# Patient Record
Sex: Female | Born: 1945 | Race: Black or African American | Hispanic: No | Smoking: Never smoker
Health system: Southern US, Community
[De-identification: ages and names within clinical notes are randomized; demographics above are authoritative.]

## PROBLEM LIST (undated history)

## (undated) DIAGNOSIS — E119 Type 2 diabetes mellitus without complications: Secondary | ICD-10-CM

## (undated) DIAGNOSIS — Z87442 Personal history of urinary calculi: Secondary | ICD-10-CM

## (undated) DIAGNOSIS — R609 Edema, unspecified: Secondary | ICD-10-CM

## (undated) DIAGNOSIS — R519 Headache, unspecified: Secondary | ICD-10-CM

## (undated) DIAGNOSIS — E875 Hyperkalemia: Secondary | ICD-10-CM

## (undated) DIAGNOSIS — K589 Irritable bowel syndrome without diarrhea: Secondary | ICD-10-CM

## (undated) DIAGNOSIS — Z5189 Encounter for other specified aftercare: Secondary | ICD-10-CM

## (undated) DIAGNOSIS — M419 Scoliosis, unspecified: Secondary | ICD-10-CM

## (undated) DIAGNOSIS — R51 Headache: Secondary | ICD-10-CM

## (undated) DIAGNOSIS — IMO0002 Reserved for concepts with insufficient information to code with codable children: Secondary | ICD-10-CM

## (undated) DIAGNOSIS — M1990 Unspecified osteoarthritis, unspecified site: Secondary | ICD-10-CM

## (undated) DIAGNOSIS — K219 Gastro-esophageal reflux disease without esophagitis: Secondary | ICD-10-CM

## (undated) DIAGNOSIS — Z9889 Other specified postprocedural states: Secondary | ICD-10-CM

## (undated) DIAGNOSIS — I10 Essential (primary) hypertension: Secondary | ICD-10-CM

## (undated) HISTORY — PX: ROTATOR CUFF REPAIR: SHX139

## (undated) HISTORY — DX: Scoliosis, unspecified: M41.9

## (undated) HISTORY — DX: Gastro-esophageal reflux disease without esophagitis: K21.9

## (undated) HISTORY — DX: Irritable bowel syndrome, unspecified: K58.9

## (undated) HISTORY — DX: Hyperkalemia: E87.5

## (undated) HISTORY — DX: Essential (primary) hypertension: I10

## (undated) HISTORY — DX: Encounter for other specified aftercare: Z51.89

## (undated) HISTORY — DX: Reserved for concepts with insufficient information to code with codable children: IMO0002

## (undated) HISTORY — DX: Headache: R51

## (undated) HISTORY — DX: Headache, unspecified: R51.9

## (undated) HISTORY — DX: Unspecified osteoarthritis, unspecified site: M19.90

## (undated) HISTORY — DX: Edema, unspecified: R60.9

## (undated) HISTORY — DX: Type 2 diabetes mellitus without complications: E11.9

---

## 1968-04-18 DIAGNOSIS — Z5189 Encounter for other specified aftercare: Secondary | ICD-10-CM

## 1968-04-18 HISTORY — PX: ABDOMINAL HYSTERECTOMY: SHX81

## 1968-04-18 HISTORY — DX: Encounter for other specified aftercare: Z51.89

## 2000-06-13 ENCOUNTER — Other Ambulatory Visit: Admission: RE | Admit: 2000-06-13 | Discharge: 2000-06-13 | Payer: Self-pay | Admitting: Internal Medicine

## 2000-06-13 ENCOUNTER — Encounter (INDEPENDENT_AMBULATORY_CARE_PROVIDER_SITE_OTHER): Payer: Self-pay

## 2000-07-06 ENCOUNTER — Encounter: Admission: RE | Admit: 2000-07-06 | Discharge: 2000-07-19 | Payer: Self-pay | Admitting: Internal Medicine

## 2001-04-18 HISTORY — PX: CARPAL TUNNEL RELEASE: SHX101

## 2001-05-25 ENCOUNTER — Ambulatory Visit (HOSPITAL_COMMUNITY): Admission: RE | Admit: 2001-05-25 | Discharge: 2001-05-25 | Payer: Self-pay | Admitting: Orthopaedic Surgery

## 2001-05-25 ENCOUNTER — Encounter: Payer: Self-pay | Admitting: Orthopaedic Surgery

## 2001-06-26 ENCOUNTER — Ambulatory Visit (HOSPITAL_BASED_OUTPATIENT_CLINIC_OR_DEPARTMENT_OTHER): Admission: RE | Admit: 2001-06-26 | Discharge: 2001-06-27 | Payer: Self-pay | Admitting: Orthopaedic Surgery

## 2001-09-21 ENCOUNTER — Other Ambulatory Visit: Admission: RE | Admit: 2001-09-21 | Discharge: 2001-09-21 | Payer: Self-pay | Admitting: Internal Medicine

## 2002-02-07 ENCOUNTER — Other Ambulatory Visit: Admission: RE | Admit: 2002-02-07 | Discharge: 2002-02-07 | Payer: Self-pay | Admitting: Internal Medicine

## 2002-03-12 ENCOUNTER — Encounter: Payer: Self-pay | Admitting: Internal Medicine

## 2002-03-12 ENCOUNTER — Encounter: Admission: RE | Admit: 2002-03-12 | Discharge: 2002-03-12 | Payer: Self-pay | Admitting: Internal Medicine

## 2002-11-05 ENCOUNTER — Ambulatory Visit (HOSPITAL_BASED_OUTPATIENT_CLINIC_OR_DEPARTMENT_OTHER): Admission: RE | Admit: 2002-11-05 | Discharge: 2002-11-05 | Payer: Self-pay | Admitting: Orthopaedic Surgery

## 2003-10-31 ENCOUNTER — Other Ambulatory Visit: Admission: RE | Admit: 2003-10-31 | Discharge: 2003-10-31 | Payer: Self-pay | Admitting: Internal Medicine

## 2004-06-16 ENCOUNTER — Encounter: Admission: RE | Admit: 2004-06-16 | Discharge: 2004-06-16 | Payer: Self-pay | Admitting: Internal Medicine

## 2004-07-06 ENCOUNTER — Encounter: Admission: RE | Admit: 2004-07-06 | Discharge: 2004-07-06 | Payer: Self-pay | Admitting: Internal Medicine

## 2005-09-13 ENCOUNTER — Encounter: Admission: RE | Admit: 2005-09-13 | Discharge: 2005-09-13 | Payer: Self-pay | Admitting: Internal Medicine

## 2006-10-03 ENCOUNTER — Encounter: Admission: RE | Admit: 2006-10-03 | Discharge: 2006-10-03 | Payer: Self-pay | Admitting: Internal Medicine

## 2007-10-04 ENCOUNTER — Encounter: Admission: RE | Admit: 2007-10-04 | Discharge: 2007-10-04 | Payer: Self-pay | Admitting: Internal Medicine

## 2008-04-18 HISTORY — PX: KNEE ARTHROSCOPY: SUR90

## 2008-09-04 ENCOUNTER — Other Ambulatory Visit: Admission: RE | Admit: 2008-09-04 | Discharge: 2008-09-04 | Payer: Self-pay | Admitting: Internal Medicine

## 2008-09-04 ENCOUNTER — Ambulatory Visit: Payer: Self-pay | Admitting: Internal Medicine

## 2008-09-12 ENCOUNTER — Encounter (INDEPENDENT_AMBULATORY_CARE_PROVIDER_SITE_OTHER): Payer: Self-pay | Admitting: *Deleted

## 2008-09-22 ENCOUNTER — Ambulatory Visit: Payer: Self-pay | Admitting: Internal Medicine

## 2008-10-14 ENCOUNTER — Ambulatory Visit: Payer: Self-pay | Admitting: Internal Medicine

## 2008-10-14 ENCOUNTER — Encounter: Admission: RE | Admit: 2008-10-14 | Discharge: 2008-10-14 | Payer: Self-pay | Admitting: Internal Medicine

## 2008-10-30 ENCOUNTER — Ambulatory Visit: Payer: Self-pay | Admitting: Internal Medicine

## 2008-11-13 ENCOUNTER — Ambulatory Visit: Payer: Self-pay | Admitting: Internal Medicine

## 2008-11-20 ENCOUNTER — Encounter: Payer: Self-pay | Admitting: Internal Medicine

## 2008-11-20 ENCOUNTER — Ambulatory Visit: Payer: Self-pay | Admitting: Internal Medicine

## 2008-11-21 ENCOUNTER — Encounter: Payer: Self-pay | Admitting: Internal Medicine

## 2009-01-27 ENCOUNTER — Ambulatory Visit: Payer: Self-pay | Admitting: Internal Medicine

## 2009-08-04 ENCOUNTER — Ambulatory Visit: Payer: Self-pay | Admitting: Internal Medicine

## 2009-09-26 ENCOUNTER — Observation Stay (HOSPITAL_COMMUNITY): Admission: EM | Admit: 2009-09-26 | Discharge: 2009-09-28 | Payer: Self-pay | Admitting: Emergency Medicine

## 2009-09-27 ENCOUNTER — Encounter (INDEPENDENT_AMBULATORY_CARE_PROVIDER_SITE_OTHER): Payer: Self-pay | Admitting: Internal Medicine

## 2009-10-15 ENCOUNTER — Encounter: Admission: RE | Admit: 2009-10-15 | Discharge: 2009-10-15 | Payer: Self-pay | Admitting: Internal Medicine

## 2009-11-26 ENCOUNTER — Ambulatory Visit: Payer: Self-pay | Admitting: Internal Medicine

## 2010-07-05 LAB — POCT CARDIAC MARKERS
Troponin i, poc: <0.05 ng/mL (ref 0.00–0.09)
Troponin i, poc: <0.05 ng/mL (ref 0.00–0.09)

## 2010-07-05 LAB — LIPID PANEL: HDL: 48 mg/dL (ref >39)

## 2010-07-05 LAB — HEMOGLOBIN A1C: Mean Plasma Glucose: 123 mg/dL — ABNORMAL HIGH (ref <117)

## 2010-07-05 LAB — CARDIAC PANEL(CRET KIN+CKTOT+MB+TROPI)
Relative Index: 0.5 (ref 0.0–2.5)
Relative Index: 0.6 (ref 0.0–2.5)
Total CK: 278 U/L — ABNORMAL HIGH (ref 7–177)
Troponin I: 0.01 ng/mL (ref 0.00–0.06)
Troponin I: 0.01 ng/mL (ref 0.00–0.06)

## 2010-07-05 LAB — HEPATIC FUNCTION PANEL
ALT: 23 U/L (ref 0–35)
AST: 22 U/L (ref 0–37)
Alkaline Phosphatase: 97 U/L (ref 39–117)
Indirect Bilirubin: 0.8 mg/dL (ref 0.3–0.9)
Total Bilirubin: 0.9 mg/dL (ref 0.3–1.2)

## 2010-07-05 LAB — DIFFERENTIAL
Basophils Absolute: 0 10*3/uL (ref 0.0–0.1)
Basophils Relative: 1 % (ref 0–1)
Eosinophils Absolute: 0.3 10*3/uL (ref 0.0–0.7)
Lymphs Abs: 1.6 10*3/uL (ref 0.7–4.0)
Monocytes Relative: 8 % (ref 3–12)
Neutro Abs: 3.1 10*3/uL (ref 1.7–7.7)
Neutrophils Relative %: 57 % (ref 43–77)

## 2010-07-05 LAB — LIPASE, BLOOD: Lipase: 21 U/L (ref 11–59)

## 2010-07-05 LAB — BASIC METABOLIC PANEL
BUN: 9 mg/dL (ref 6–23)
CO2: 30 mEq/L (ref 19–32)
Calcium: 8 mg/dL — ABNORMAL LOW (ref 8.4–10.5)
Chloride: 102 mEq/L (ref 96–112)
Creatinine, Ser: 1.07 mg/dL (ref 0.4–1.2)
GFR calc Af Amer: >60 mL/min (ref >60)
Glucose, Bld: 96 mg/dL (ref 70–99)

## 2010-07-05 LAB — CBC
HCT: 38.7 % (ref 36.0–46.0)
HCT: 39.3 % (ref 36.0–46.0)
Hemoglobin: 13.3 g/dL (ref 12.0–15.0)
MCV: 84.2 fL (ref 78.0–100.0)
Platelets: 283 10*3/uL (ref 150–400)
RDW: 14 % (ref 11.5–15.5)
WBC: 5.4 10*3/uL (ref 4.0–10.5)
WBC: 6.5 10*3/uL (ref 4.0–10.5)

## 2010-07-05 LAB — MAGNESIUM: Magnesium: 2.2 mg/dL (ref 1.5–2.5)

## 2010-07-05 LAB — TSH: TSH: 1.524 u[IU]/mL (ref 0.350–4.500)

## 2010-07-05 LAB — COMPREHENSIVE METABOLIC PANEL
ALT: 22 U/L (ref 0–35)
Albumin: 3.8 g/dL (ref 3.5–5.2)
GFR calc Af Amer: >60 mL/min (ref >60)
Potassium: 3.8 mEq/L (ref 3.5–5.1)
Total Bilirubin: 1.1 mg/dL (ref 0.3–1.2)

## 2010-07-05 LAB — RAPID URINE DRUG SCREEN, HOSP PERFORMED
Cocaine: NOT DETECTED
Opiates: NOT DETECTED

## 2010-07-05 LAB — CK TOTAL AND CKMB (NOT AT ARMC)
Relative Index: 0.5 (ref 0.0–2.5)
Total CK: 307 U/L — ABNORMAL HIGH (ref 7–177)

## 2010-07-05 LAB — D-DIMER, QUANTITATIVE: D-Dimer, Quant: 0.68 ug/mL-FEU — ABNORMAL HIGH (ref 0.00–0.48)

## 2010-07-05 LAB — GLUCOSE, CAPILLARY
Glucose-Capillary: 117 mg/dL — ABNORMAL HIGH (ref 70–99)
Glucose-Capillary: 178 mg/dL — ABNORMAL HIGH (ref 70–99)
Glucose-Capillary: 97 mg/dL (ref 70–99)

## 2010-07-24 LAB — GLUCOSE, CAPILLARY: Glucose-Capillary: 129 mg/dL — ABNORMAL HIGH (ref 70–99)

## 2010-09-03 NOTE — Op Note (Signed)
Kathleen Greer, Kathleen Greer                          ACCOUNT NO.:  [id number]   MEDICAL RECORD NO.:  [id number]                   PATIENT TYPE:  AMB   LOCATION:  DSC                                  FACILITY:  [facility]   PHYSICIAN:  Lubertha Basque. Jerl Santos, M.D.             DATE OF BIRTH:  Jan 03, 1946   DATE OF PROCEDURE:  11/05/2002  DATE OF DISCHARGE:                                 OPERATIVE REPORT   PREOPERATIVE DIAGNOSIS:  Right carpal tunnel syndrome.   POSTOPERATIVE DIAGNOSIS:  Right carpal tunnel syndrome.   OPERATION:  Right carpal tunnel release.   SURGEON:  Lubertha Basque. Jerl Santos, M.D.   ASSISTANT:  Lindwood Qua, P.A.   ANESTHESIA:  Bier block, MAC   INDICATIONS FOR PROCEDURE:  The patient is a 65 year old woman with long  history of hand pain and numbness.  This persisted despite oral anti-  inflammatories and bracing.  She has undergone a nerve conduction study  which shows some moderate carpal tunnel syndrome.  With continued discomfort  at rest and with activity is offered a carpal tunnel release.  Informed  operative consent was obtained after discussion of the possible  complications of and reaction to anesthesia, infection, neurovascular  injury.   DESCRIPTION OF PROCEDURE:  The patient was taken to the operating suite  where a Bier block was applied to the level of the forearm.  She was also  given some sedation.  She was positioned supine and prepped and draped in  normal sterile fashion.  After the administration of preoperative IV  antibiotics a small palmar incision was made ulnar to the thenar flexion  crease.  Dissection was carried down through the palmar fascia to expose the  transverse carpal ligament which was released under direct visualization.  This was slightly thickened and did appear to be applying some extra  pressure to the median nerve.  The release was taken distally to the  transverse arch of vessels and proximally through the distal fascia of the  forearm.  The wound was irrigated followed by reapproximation of the skin  with Nylon.  Adaptic was applied to the wound followed by dry gauze and a  volar splint of plaster with the wrist in extension.  The tourniquet was  deflated during closure and the fingers all become pink and warm  immediately.  Estimated blood loss and intraoperative fluids as well as  accurate tourniquet time can be obtained from anesthesia records.   DISPOSITION:  The patient was taken to the recovery room in stable  condition.  Plans were for her to go home the same day and to follow up in  the office in less than a week.  I will contact her by phone tonight.  Lubertha Basque Jerl Santos, M.D.    PGD/MEDQ  D:  11/05/2002  T:  11/05/2002  Job:  [id number]

## 2010-09-03 NOTE — Op Note (Signed)
[place]  Patient:    Kathleen Greer, Kathleen Greer Visit Number: [id number] MRN: [id number]          Service Type: DSU Location: [facility] Attending Physician:  Marcene Corning Dictated by:   Lubertha Basque. Jerl Santos, M.D. Proc. Date: 06/26/01 Admit Date:  06/26/2001                             Operative Report  PREOPERATIVE DIAGNOSIS:  Right shoulder rotator cuff tear.  POSTOPERATIVE DIAGNOSIS:  Right shoulder rotator cuff tear.  OPERATION PERFORMED: 1. Right shoulder arthroscopic debridement. 2. Right shoulder open rotator cuff repair.  ANESTHESIA:  General and block.  ATTENDING SURGEON:  Lubertha Basque. Jerl Santos, M.D.  ASSISTANT:  Lindwood Qua, P.A.  INDICATIONS FOR PROCEDURE:  The patient is a 65 year old woman about seven or eight months out from a right shoulder arthroscopy for impingement. Unfortunately she has not experienced complete resolution of her trouble.  She persists with pain and weakness.  She has undergone an MRI scan which shows a rotator cuff tear.  She was offered a repeat procedure at this point, which will consist of an arthroscopy, likely followed by an open rotator cuff repair.  The procedure was discussed with the patient and informed operative consent was obtained after discussion of possible complications of reaction to anesthesia, infection and shoulder stiffness.  DESCRIPTION OF PROCEDURE:  The patient was taken to an operating suite where general anesthetic was applied after a block was given in the preanesthesia area.  She was then positioned in a beach chair position and prepped and draped in normal sterile fashion.  After administration of preop intravenous antibiotics, an arthroscopy of the right shoulder was performed through a total of two portals.  The glenohumeral joint showed no degenerative change and the biceps tendon and labral structures were intact.  From below, the rotator cuff appeared to have an irregularity  consistent with a tear. Subacromial space she had a nice flat acromion and no additional acromioplasty was required.  She was also status post and adequate AC resection.  The entire rotator cuff was examined and she had a small 1 cm tear at the junction of the supraspinatus and infraspinatus portions of the cuff.  This was minimally retracted.  She also has some surrounding degenerative tissue.  It was felt best to repair this in an open fashion so as to excise some degenerative tissue more adequately and assess the status of the rest of the surrounding rotator cuff.  As a result, a small anterior incision was made with dissection down through the deltoid which was left attached to the acromion.  The affected area was inspected.  Some degenerative tissues were removed from the area around the rotator cuff tear.  A bleeding bed of bone was created with a bur and a rongeur.  A single ArthRx anchor was then placed with two Ethibond sutures emanating.  These were passed through the rotator cuff and tied in mattress fashion reapproximating the cuff to the bleeding bed of bone.  The wound was then irrigated followed by reapproximation of the deltoid fascia with 2-0 undyed Vicryl and subcutaneous reapproximation with similar suture. Vicryl was then used to close the skin in subcuticular fashion followed by Steri-Strips, Adaptic, and dry gauze with tape.  Estimated blood loss and intraoperative fluids can be obtained from Anesthesia records.  DISPOSITION:  The patient was extubated in the operating room and taken to the recovery room  in stable condition.  Plans were for her to stay overnight with probable discharged home in the morning for pain control. Dictated by:   Lubertha Basque Jerl Santos, M.D. Attending Physician:  Marcene Corning DD:  06/26/01 TD:  06/26/01 Job: [id number]

## 2010-09-10 ENCOUNTER — Telehealth: Payer: Self-pay

## 2010-09-10 MED ORDER — POTASSIUM CHLORIDE 20 MEQ PO PACK: 20.0000 meq | PACK | Freq: Every day | ORAL | Status: DC

## 2010-09-10 MED ORDER — OMEPRAZOLE 20 MG PO CPDR: 20.0000 mg | DELAYED_RELEASE_CAPSULE | Freq: Every day | ORAL | Status: DC

## 2010-09-10 MED ORDER — AMLODIPINE BESYLATE 5 MG PO TABS: 5.0000 mg | ORAL_TABLET | Freq: Every day | ORAL | Status: DC

## 2010-09-10 MED ORDER — TORSEMIDE 20 MG PO TABS: ORAL_TABLET | ORAL | Status: DC

## 2010-09-10 MED ORDER — METFORMIN HCL 500 MG PO TABS: 500.0000 mg | ORAL_TABLET | Freq: Every day | ORAL | Status: DC

## 2010-09-10 NOTE — Telephone Encounter (Signed)
rx refill

## 2010-09-27 ENCOUNTER — Other Ambulatory Visit: Payer: Self-pay | Admitting: Internal Medicine

## 2010-09-27 DIAGNOSIS — Z1231 Encounter for screening mammogram for malignant neoplasm of breast: Secondary | ICD-10-CM

## 2010-10-18 ENCOUNTER — Ambulatory Visit
Admission: RE | Admit: 2010-10-18 | Discharge: 2010-10-18 | Disposition: A | Discharge status: Home / Self Care | Payer: Medicare Other | Source: Ambulatory Visit | Attending: Internal Medicine | Admitting: Internal Medicine

## 2010-10-18 DIAGNOSIS — Z1231 Encounter for screening mammogram for malignant neoplasm of breast: Secondary | ICD-10-CM

## 2010-11-25 ENCOUNTER — Encounter: Payer: Self-pay | Admitting: Internal Medicine

## 2010-11-26 ENCOUNTER — Ambulatory Visit: Payer: Self-pay | Admitting: Internal Medicine

## 2010-11-26 ENCOUNTER — Encounter: Payer: Self-pay | Admitting: Internal Medicine

## 2010-11-26 ENCOUNTER — Ambulatory Visit (INDEPENDENT_AMBULATORY_CARE_PROVIDER_SITE_OTHER): Payer: Medicare Other | Admitting: Internal Medicine

## 2010-11-26 VITALS — BP 132/88 | HR 76 | Temp 97.9°F | Ht 64.25 in | Wt 188.0 lb

## 2010-11-26 DIAGNOSIS — E119 Type 2 diabetes mellitus without complications: Secondary | ICD-10-CM

## 2010-11-26 DIAGNOSIS — Z0000 Encounter for general adult medical examination without abnormal findings: Secondary | ICD-10-CM

## 2010-11-26 DIAGNOSIS — K219 Gastro-esophageal reflux disease without esophagitis: Secondary | ICD-10-CM

## 2010-11-26 DIAGNOSIS — I10 Essential (primary) hypertension: Secondary | ICD-10-CM

## 2010-11-26 DIAGNOSIS — H8110 Benign paroxysmal vertigo, unspecified ear: Secondary | ICD-10-CM

## 2010-11-26 DIAGNOSIS — Z23 Encounter for immunization: Secondary | ICD-10-CM

## 2010-11-26 DIAGNOSIS — Z86010 Personal history of colonic polyps: Secondary | ICD-10-CM

## 2010-11-26 DIAGNOSIS — R7309 Other abnormal glucose: Secondary | ICD-10-CM

## 2010-11-26 DIAGNOSIS — R609 Edema, unspecified: Secondary | ICD-10-CM

## 2010-11-26 DIAGNOSIS — R7302 Impaired glucose tolerance (oral): Secondary | ICD-10-CM

## 2010-11-26 DIAGNOSIS — IMO0002 Reserved for concepts with insufficient information to code with codable children: Secondary | ICD-10-CM

## 2010-11-26 LAB — CBC WITH DIFFERENTIAL/PLATELET
Basophils Absolute: 0 10*3/uL (ref 0.0–0.1)
Lymphocytes Relative: 36 % (ref 12–46)
Neutro Abs: 2.9 10*3/uL (ref 1.7–7.7)
Neutrophils Relative %: 50 % (ref 43–77)
Platelets: 295 10*3/uL (ref 150–400)
RDW: 14.1 % (ref 11.5–15.5)
WBC: 5.7 10*3/uL (ref 4.0–10.5)

## 2010-11-26 LAB — COMPREHENSIVE METABOLIC PANEL
CO2: 27 mEq/L (ref 19–32)
Calcium: 9.4 mg/dL (ref 8.4–10.5)
Creat: 1.18 mg/dL — ABNORMAL HIGH (ref 0.50–1.10)
Glucose, Bld: 134 mg/dL — ABNORMAL HIGH (ref 70–99)
Total Bilirubin: 1.1 mg/dL (ref 0.3–1.2)
Total Protein: 7.2 g/dL (ref 6.0–8.3)

## 2010-11-26 LAB — POCT URINALYSIS DIPSTICK
Blood, UA: NEGATIVE
Glucose, UA: NEGATIVE
Nitrite, UA: NEGATIVE
Protein, UA: NEGATIVE
Urobilinogen, UA: NEGATIVE

## 2010-11-26 LAB — LIPID PANEL
HDL: 53 mg/dL (ref >39)
Triglycerides: 104 mg/dL (ref <150)

## 2010-11-26 LAB — TSH: TSH: 2.209 u[IU]/mL (ref 0.350–4.500)

## 2010-11-26 MED ORDER — ZOSTER VACCINE LIVE 19400 UNT/0.65ML ~~LOC~~ SOLR: 0.6500 mL | Freq: Once | SUBCUTANEOUS | Status: DC

## 2010-11-27 LAB — HEMOGLOBIN A1C: Mean Plasma Glucose: 157 mg/dL — ABNORMAL HIGH (ref <117)

## 2010-11-27 LAB — VITAMIN D 25 HYDROXY (VIT D DEFICIENCY, FRACTURES): Vit D, 25-Hydroxy: 29 ng/mL — ABNORMAL LOW (ref 30–89)

## 2010-11-29 ENCOUNTER — Encounter: Payer: Self-pay | Admitting: Internal Medicine

## 2011-01-12 DIAGNOSIS — H8110 Benign paroxysmal vertigo, unspecified ear: Secondary | ICD-10-CM | POA: Insufficient documentation

## 2011-01-12 DIAGNOSIS — Z86010 Personal history of colonic polyps: Secondary | ICD-10-CM | POA: Insufficient documentation

## 2011-01-12 DIAGNOSIS — I10 Essential (primary) hypertension: Secondary | ICD-10-CM | POA: Insufficient documentation

## 2011-01-12 DIAGNOSIS — K219 Gastro-esophageal reflux disease without esophagitis: Secondary | ICD-10-CM | POA: Insufficient documentation

## 2011-01-12 DIAGNOSIS — R609 Edema, unspecified: Secondary | ICD-10-CM | POA: Insufficient documentation

## 2011-01-12 DIAGNOSIS — R7302 Impaired glucose tolerance (oral): Secondary | ICD-10-CM | POA: Insufficient documentation

## 2011-01-12 NOTE — Progress Notes (Signed)
  Subjective:    Patient ID: Kathleen Greer, female    DOB: Mar 03, 1946, 65 y.o.   MRN: [id number]  HPI and 65 year old black female with history of scoliosis of the lumbar spine, GE reflux, hypertension, positional vertigo, dependent edema, adenomatous colon polyps for medical evaluation. History of hysterectomy without oophorectomy 1971, right rotator cuff surgery June 2002 in March 2003, right carpal tunnel syndrome with release done by [facility] 19-Sep-2001. Penicillin causes a rash. Zostavax vaccine given today. Tetanus immunization given April 2011. She is retired. She plans to take in a couple of new foster children. She she tried foster parenting with a teenage girl that proved to be a handful and the girl went back to foster care elsewhere. [place] formerly worked as a Dealer for [employer]. She is divorced. Does not smoke or consume alcohol. She has one adult son who has high blood pressure.  Patient's father died with an MI. Mother died with an MI. One brother died with heart failure another brother died in 2004-09-19 with cirrhosis. One sister died at age 36 another sister whose health is apparently okay.  Patient had colonoscopy by Dr. Jonny Ruiz. In August 2010. Had mammogram June 2011.  No complaints or problems today. She also has glucose intolerance and is on Glucophage.    Review of Systems  Constitutional: Negative.   HENT: Negative.   Eyes: Negative.   Cardiovascular: Negative.   Gastrointestinal: Negative.   Genitourinary: Negative.   Musculoskeletal: Negative.   Neurological: Negative.   Hematological: Negative.   Psychiatric/Behavioral: Negative.        Objective:   Physical Exam  Constitutional: She is oriented to person, place, and time. No distress.  HENT:  Head: Normocephalic and atraumatic.  Right Ear: External ear normal.  Left Ear: External ear normal.  Mouth/Throat: Oropharynx is clear and moist.  Eyes: Conjunctivae and EOM are normal. Pupils are equal, round, and  reactive to light. Right eye exhibits no discharge. Left eye exhibits no discharge. No scleral icterus.  Neck: Neck supple. No JVD present. No thyromegaly present.  Cardiovascular: Regular rhythm and normal heart sounds.   No murmur heard. Pulmonary/Chest: Effort normal and breath sounds normal. She has no wheezes. She has no rales.  Abdominal: Soft. Bowel sounds are normal. She exhibits no distension. There is no tenderness. There is no guarding.  Genitourinary:       Deferred  Musculoskeletal: Normal range of motion.  Lymphadenopathy:    She has no cervical adenopathy.  Neurological: She is alert and oriented to person, place, and time. She has normal reflexes. No cranial nerve deficit. Coordination normal.  Skin: Skin is warm and dry. She is not diaphoretic.  Psychiatric: She has a normal mood and affect.         Assessment & Plan  Hypertension  Hypertension hyperten hypertension  Hypertension   GE reflux Glucose intolerance History of positional vertigo  History of dependent edema  History of adenomatous colon polyps  Plan return in 6 months. Okay to refill Nexium, Norvasc, Demadex if drugstore calls as well as Cozaar and metformin.

## 2011-03-08 ENCOUNTER — Other Ambulatory Visit: Payer: Self-pay | Admitting: Internal Medicine

## 2011-03-11 ENCOUNTER — Other Ambulatory Visit: Payer: Self-pay | Admitting: Internal Medicine

## 2011-03-14 ENCOUNTER — Other Ambulatory Visit: Payer: Self-pay | Admitting: Internal Medicine

## 2011-03-18 ENCOUNTER — Other Ambulatory Visit: Payer: Self-pay | Admitting: Internal Medicine

## 2011-05-02 ENCOUNTER — Other Ambulatory Visit: Payer: Self-pay

## 2011-05-02 MED ORDER — OMEPRAZOLE 20 MG PO CPDR: 20.0000 mg | DELAYED_RELEASE_CAPSULE | Freq: Every day | ORAL | Status: DC

## 2011-05-26 ENCOUNTER — Encounter: Payer: Self-pay | Admitting: Internal Medicine

## 2011-05-26 ENCOUNTER — Ambulatory Visit (INDEPENDENT_AMBULATORY_CARE_PROVIDER_SITE_OTHER): Payer: Medicare Other | Admitting: Internal Medicine

## 2011-05-26 VITALS — BP 176/100 | HR 76 | Temp 98.0°F | Wt 189.0 lb

## 2011-05-26 DIAGNOSIS — G44209 Tension-type headache, unspecified, not intractable: Secondary | ICD-10-CM

## 2011-05-26 DIAGNOSIS — I10 Essential (primary) hypertension: Secondary | ICD-10-CM

## 2011-05-26 NOTE — Progress Notes (Signed)
  Subjective:    Patient ID: Kathleen Greer, female    DOB: 05-09-1945, 66 y.o.   MRN: [id number]  HPI  Pt in today for an acute visit with complaint of left-sided headache for couple of days. No visual disturbances. Is having some difficulty seeing when driving at night. History of hypertension. History of anxiety. Patient is concerned because youngest sister died of an aneurysm. Has felt lightheaded. No distinct vertigo. No recent URI. No situational concerns. No heavy lifting. No nausea, vomiting, fever or chills.    Review of Systems     Objective:   Physical Exam    blood pressure left arm large cuff 160/100, right arm large cuff 160/100. Generally blood pressure is well-controlled. HEENT exam: funduscopic exam is difficult. Arteriolar narrowing is noted. Extraocular movements are full. PERRLA Pharynx is clear. TMs are clear. Deep tendon reflexes 2+ and symmetrical. No facial asymmetry. She has palpable spasm in the left sternocleidomastoid muscle area. Gait is normal        Assessment & Plan:  Muscle contraction headache  Hypertension-pain could be raising blood pressure.  Plan: Sterapred DS 10 mg 6 day dosepak take as directed. Vicodin 5/500 (#40) 1 by mouth every 6 hours when necessary pain with one refill. Return next week for followup on blood pressure as she has no home blood pressure cuff.

## 2011-05-26 NOTE — Patient Instructions (Signed)
Take prednisone in a tapering course as directed. Take Vicodin as needed for pain. Apply ice 20 minutes to neck twice daily. Return in one week.

## 2011-06-02 ENCOUNTER — Ambulatory Visit (INDEPENDENT_AMBULATORY_CARE_PROVIDER_SITE_OTHER): Payer: Medicare Other | Admitting: Internal Medicine

## 2011-06-02 ENCOUNTER — Encounter: Payer: Self-pay | Admitting: Internal Medicine

## 2011-06-02 DIAGNOSIS — I10 Essential (primary) hypertension: Secondary | ICD-10-CM

## 2011-06-02 DIAGNOSIS — F419 Anxiety disorder, unspecified: Secondary | ICD-10-CM | POA: Insufficient documentation

## 2011-06-02 DIAGNOSIS — F411 Generalized anxiety disorder: Secondary | ICD-10-CM

## 2011-06-02 NOTE — Progress Notes (Signed)
  Subjective:    Patient ID: Kathleen Greer, female    DOB: 03-14-1946, 66 y.o.   MRN: [id number]  HPI Here today to follow up on hypertension. Headache has resolved with Vicodin and Sterapred dose pack. Raising 65  Year old grandson and took in foster child-a 65 year old White female who has been difficult. I believe this situation is aggravating her blood pressure. She is on Demadex and Amlodipine already. Malen Gauze child is scheduled to leave in June if not sooner.   Review of Systems     Objective:   Physical Exam Chest clear; Cor RRR ; Ext- without edema. Long discussion with patient about stress with situation. Spent 20 minutes counseling patient.        Assessment & Plan:  Anxiety  Hypertension Plan: Xanax 0.25mg  (30) 1 po bid prn anxiety.  Bystolic 5mg  daily samples provided. Return in 3 weeks.

## 2011-06-02 NOTE — Patient Instructions (Signed)
Start Bystolic 5mg   daily. Continue Norvasc and Demadex. Return in 3 weeks. Take Xanax sparingly for anxiety.

## 2011-06-23 ENCOUNTER — Ambulatory Visit (INDEPENDENT_AMBULATORY_CARE_PROVIDER_SITE_OTHER): Payer: Medicare Other | Admitting: Internal Medicine

## 2011-06-23 ENCOUNTER — Encounter: Payer: Self-pay | Admitting: Internal Medicine

## 2011-06-23 VITALS — BP 148/90 | HR 64 | Temp 98.0°F | Wt 192.0 lb

## 2011-06-23 DIAGNOSIS — F419 Anxiety disorder, unspecified: Secondary | ICD-10-CM

## 2011-06-23 DIAGNOSIS — I10 Essential (primary) hypertension: Secondary | ICD-10-CM

## 2011-06-23 DIAGNOSIS — F411 Generalized anxiety disorder: Secondary | ICD-10-CM

## 2011-06-23 NOTE — Patient Instructions (Addendum)
Continue amlodipine 5 mg daily, Bystolic 5 mg daily and Demadex 20 mg daily. Return in 2 months.

## 2011-06-23 NOTE — Progress Notes (Signed)
  Subjective:    Patient ID: Kathleen Greer, female    DOB: 09/02/1945, 66 y.o.   MRN: [id number]  HPI 66 year old black female with hypertension. Blood pressure was elevated last visit. She has considerable anxiety related to a female foster child she is trying to raise. She has come to the decision that child who is now 69 years old probably needs to find another home. Gets very stressful. Blood pressure rechecked today 130/90. She's on amlodipine 5 mg and Demadex 20 mg daily. At last visit we added Bystolic 5 mg daily. Used to have significant dependent edema but that has improved.    Review of Systems     Objective:   Physical Exam chest clear to auscultation; cardiac exam regular rate and rhythm extremities no pitting edema          Assessment & Plan:    Hypertension  Anxiety  Plan: Patient is to return in 2 months. Continue same medications amlodipine 5 mg daily , Bystolic 5 mg daily and Demadex 20 mg daily. Hopefully by then [facility] child will have found another home. Spent 20 minutes talking with patient about situation.

## 2011-08-23 ENCOUNTER — Encounter: Payer: Self-pay | Admitting: Internal Medicine

## 2011-08-23 ENCOUNTER — Ambulatory Visit (INDEPENDENT_AMBULATORY_CARE_PROVIDER_SITE_OTHER): Payer: Medicare Other | Admitting: Internal Medicine

## 2011-08-23 DIAGNOSIS — R609 Edema, unspecified: Secondary | ICD-10-CM

## 2011-08-23 DIAGNOSIS — R7309 Other abnormal glucose: Secondary | ICD-10-CM

## 2011-08-23 DIAGNOSIS — I10 Essential (primary) hypertension: Secondary | ICD-10-CM

## 2011-08-23 DIAGNOSIS — R7302 Impaired glucose tolerance (oral): Secondary | ICD-10-CM

## 2011-08-23 LAB — HEMOGLOBIN A1C
Hgb A1c MFr Bld: 6.4 % — ABNORMAL HIGH (ref <5.7)
Mean Plasma Glucose: 137 mg/dL — ABNORMAL HIGH (ref <117)

## 2011-08-23 NOTE — Progress Notes (Signed)
  Subjective:    Patient ID: Kathleen Greer, female    DOB: 11/27/1945, 66 y.o.   MRN: [id number]  HPI 66 year old black female with impaired glucose tolerance, hypertension, dependent edema in today for recheck. Blood pressure initially elevated at 150 systolically on arrival but when I rechecked with large cuff it is 140/80. We had her on Bystolic 5 mg, amlodipine 5 mg, torsemide 20 mg daily. Reminded about mammogram which is due in June. Reminded about diabetic eye exam. Urine obtained for microalbumin. Doesn't check Accu-Cheks at home. Doesn't check blood pressure at home. Needs to purchase a home blood pressure cuff.        Review of Systems     Objective:   Physical Exam Chest clear to auscultation; cardiac exam regular rate and rhythm; extremities without edema       Assessment & Plan:  Hypertension  Impaired glucose tolerance  Dependent edema  Plan: Obtained today urine for microalbumin. Reminded about diabetic eye exam. Increase amlodipine to 10 mg daily. It may cause worsening of dependent edema. Patient will call me if that is the case. She is to purchase a home blood pressure cuff and take blood pressure daily. Return in 8 weeks. Physical exam scheduled for September 2013. She had colonoscopy 11/20/2008 with a small tubular adenoma being found.

## 2011-08-23 NOTE — Patient Instructions (Signed)
Increase amlodipine to 10 mg daily. Continue torsemide 20 mg daily. Stop Bystolic. Purchased a home blood pressure cuff and take blood pressure daily. Return in 8 weeks. Physical examination has been set up for September. Obtain mammogram in late June.

## 2011-08-24 LAB — MICROALBUMIN, URINE: Microalb, Ur: 1.3 mg/dL (ref 0.00–1.89)

## 2011-09-13 ENCOUNTER — Other Ambulatory Visit: Payer: Self-pay | Admitting: Internal Medicine

## 2011-09-13 DIAGNOSIS — Z1231 Encounter for screening mammogram for malignant neoplasm of breast: Secondary | ICD-10-CM

## 2011-09-23 ENCOUNTER — Ambulatory Visit: Payer: Medicare Other | Admitting: Internal Medicine

## 2011-10-17 ENCOUNTER — Encounter: Payer: Self-pay | Admitting: Internal Medicine

## 2011-10-17 ENCOUNTER — Ambulatory Visit (INDEPENDENT_AMBULATORY_CARE_PROVIDER_SITE_OTHER): Payer: Medicare Other | Admitting: Internal Medicine

## 2011-10-17 VITALS — BP 132/78 | HR 80 | Temp 98.2°F | Ht 64.25 in | Wt 193.0 lb

## 2011-10-17 DIAGNOSIS — M25569 Pain in unspecified knee: Secondary | ICD-10-CM

## 2011-10-17 DIAGNOSIS — F439 Reaction to severe stress, unspecified: Secondary | ICD-10-CM

## 2011-10-17 DIAGNOSIS — F411 Generalized anxiety disorder: Secondary | ICD-10-CM

## 2011-10-17 DIAGNOSIS — F419 Anxiety disorder, unspecified: Secondary | ICD-10-CM

## 2011-10-17 DIAGNOSIS — F430 Acute stress reaction: Secondary | ICD-10-CM

## 2011-10-17 DIAGNOSIS — I10 Essential (primary) hypertension: Secondary | ICD-10-CM

## 2011-10-17 NOTE — Progress Notes (Signed)
  Subjective:    Patient ID: Kathleen Greer, female    DOB: 1945-10-02, 66 y.o.   MRN: [id number]  HPI  36 year Black female raising grandson who is 5 years old. Pt has hx of hypertension. At last visit, we stopped Bystolic and increased Amlodopine to 10 mg daily. Pt now has a home blood pressure cuff and has been watching it at home. She has noted that blood pressure tends to go up with upsetting family situations. Has not noticed increased leg swelling with increased dose of amlodipine. Tolerating increased dose of amlodipine fine without any problems. At last visit hemoglobin A1c showed improvement. Urine for microalbumin fell within normal limits. Have her she's currently not on an ARB or ACE inhibitor. She is on torsemide, amlodipine, potassium supplement. Considerable family stress at the present time. This is upsetting to her. She did have some foster children but that became a bit too much because reactive doubt a great deal. She now just has her grandson that she is raising.  Says that she went to see orthopedist to [facility] orth[facility] regarding knee pain and had an injection. It lasted for short time but pain has returned. Told her she should return perhaps for second injection but may need to consider other options such as knee replacement if she continues to have pain. She does have pain pills on hand.  With regard anxiety, she does have Xanax on hand but has been reluctant to take it when she is upset.    Review of Systems     Objective:   Physical Exam neck is supple without JVD, thyromegaly, or carotid bruits; chest clear to auscultation; cardiac exam: regular rate and rhythm, normal S1 and S2; extremities without edema.        Assessment & Plan:  Hypertension  Anxiety  Osteoarthritis of knee  Diabetes mellitus  Plan: Patient has physical exam scheduled for September 2013 and she will keep that appointment. I have started her on Cozaar 50 mg daily in addition to  amlodipine 10 mg daily, potassium supplement, torsemide. Encouraged her to take Xanax when she is upset. This will hopefully prevent labile fluctuations in blood pressure. May want to return to orthopedist for reevaluation of knee pain. She does have pain medication on hand to take for knee pain pain should become severe.

## 2011-10-17 NOTE — Patient Instructions (Addendum)
We are adding a new antihypertensive medication Cozaar 50 mg daily which should help with renal protection since she have diabetes. Please watch diet. Hemoglobin A1c is better than it was several months ago. Continue same antihypertensive medications and potassium supplement. Take Xanax sparingly for anxiety and stressful situations. You may want to return  orthopedist regarding knee pain.

## 2011-10-24 ENCOUNTER — Ambulatory Visit
Admission: RE | Admit: 2011-10-24 | Discharge: 2011-10-24 | Disposition: A | Discharge status: Home / Self Care | Payer: Medicare Other | Source: Ambulatory Visit | Attending: Internal Medicine | Admitting: Internal Medicine

## 2011-10-24 DIAGNOSIS — Z1231 Encounter for screening mammogram for malignant neoplasm of breast: Secondary | ICD-10-CM

## 2011-12-29 ENCOUNTER — Other Ambulatory Visit: Payer: Medicare Other | Admitting: Internal Medicine

## 2011-12-29 DIAGNOSIS — R7301 Impaired fasting glucose: Secondary | ICD-10-CM

## 2011-12-29 DIAGNOSIS — I10 Essential (primary) hypertension: Secondary | ICD-10-CM

## 2011-12-29 DIAGNOSIS — E559 Vitamin D deficiency, unspecified: Secondary | ICD-10-CM

## 2011-12-29 LAB — LIPID PANEL: Cholesterol: 179 mg/dL (ref 0–200)

## 2011-12-29 LAB — CBC WITH DIFFERENTIAL/PLATELET
Basophils Absolute: 0 10*3/uL (ref 0.0–0.1)
Lymphocytes Relative: 34 % (ref 12–46)
Lymphs Abs: 1.8 10*3/uL (ref 0.7–4.0)
MCV: 78.4 fL (ref 78.0–100.0)
Neutro Abs: 2.8 10*3/uL (ref 1.7–7.7)
Neutrophils Relative %: 51 % (ref 43–77)
Platelets: 362 10*3/uL (ref 150–400)
RBC: 4.81 MIL/uL (ref 3.87–5.11)
WBC: 5.4 10*3/uL (ref 4.0–10.5)

## 2011-12-29 LAB — COMPREHENSIVE METABOLIC PANEL
ALT: 22 U/L (ref 0–35)
AST: 20 U/L (ref 0–37)
CO2: 31 mEq/L (ref 19–32)
Calcium: 9.8 mg/dL (ref 8.4–10.5)
Chloride: 106 mEq/L (ref 96–112)
Potassium: 4.5 mEq/L (ref 3.5–5.3)
Sodium: 144 mEq/L (ref 135–145)
Total Protein: 7 g/dL (ref 6.0–8.3)

## 2011-12-30 ENCOUNTER — Encounter: Payer: Self-pay | Admitting: Internal Medicine

## 2011-12-30 ENCOUNTER — Ambulatory Visit (INDEPENDENT_AMBULATORY_CARE_PROVIDER_SITE_OTHER): Payer: Medicare Other | Admitting: Internal Medicine

## 2011-12-30 VITALS — BP 142/92 | HR 68 | Temp 98.4°F | Ht 64.5 in | Wt 185.0 lb

## 2011-12-30 DIAGNOSIS — Z23 Encounter for immunization: Secondary | ICD-10-CM

## 2011-12-30 DIAGNOSIS — Z0000 Encounter for general adult medical examination without abnormal findings: Secondary | ICD-10-CM

## 2011-12-30 DIAGNOSIS — I10 Essential (primary) hypertension: Secondary | ICD-10-CM

## 2011-12-30 LAB — POCT URINALYSIS DIPSTICK
Bilirubin, UA: NEGATIVE
Ketones, UA: NEGATIVE
Leukocytes, UA: NEGATIVE
Nitrite, UA: NEGATIVE
Protein, UA: NEGATIVE
pH, UA: 5.5

## 2011-12-30 LAB — HEMOGLOBIN A1C: Mean Plasma Glucose: 140 mg/dL — ABNORMAL HIGH (ref <117)

## 2011-12-30 MED ORDER — PNEUMOCOCCAL VAC POLYVALENT 25 MCG/0.5ML IJ INJ
0.5000 mL | INJECTION | Freq: Once | INTRAMUSCULAR | Status: AC
  Administered 2011-12-30: 0.5 mL via INTRAMUSCULAR

## 2011-12-30 NOTE — Progress Notes (Signed)
Subjective:    Patient ID: Kathleen Greer, female    DOB: [birth year], 66 y.o.   MRN: [id number]  HPI 66 year old Black Female for Welcome to Medicare exam.  History of scoliosis of the lumbar spine, GE reflux, hypertension, positional vertigo, dependent edema, adenomatous colon polyps. Had hysterectomy without oophorectomy 1971, right rotator cuff surgery June 2002. Right carpal tunnel release done by Dr.Daldorf 2001/09/27.  Zostavax vaccine given September 2012. Tetanus immunization given April 2011. Penicillin causes a rash.  She is retired. She tried foster parenting with a teenage girl that proved to be quite a handful. Her one back to foster care elsewhere. Patient formerly worked as a Location manager at [employer]. She is divorced. Does not smoke or consume alcohol. Has one adult son with high blood pressure.  Patient's father died with an MI. Mother died with an MI. One brother died with heart failure. Another brother died in 09/27/2004 with cirrhosis of the liver. One sister died at age 48. Another sister whose health is apparently okay.  Patient had colonoscopy in August 2010. Reminded regarding annual mammogram.  History of glucose intolerance treated with metformin.    Review of Systems  Constitutional: Positive for fatigue.  HENT: Negative.   Eyes: Negative.   Respiratory: Negative.   Cardiovascular: Negative.   Gastrointestinal: Negative.   Endocrine: Negative.   Genitourinary: Negative.   Musculoskeletal: Positive for arthralgias.  Skin: Negative.   Allergic/Immunologic: Negative.   Neurological: Negative.   Hematological: Negative.   Psychiatric/Behavioral: Negative.        Objective:   Physical Exam  Vitals reviewed. Constitutional: She is oriented to person, place, and time. She appears well-developed and well-nourished. No distress.  HENT:  Head: Normocephalic and atraumatic.  Right Ear: External ear normal.  Left Ear: External ear normal.  Mouth/Throat: Oropharynx is  clear and moist. No oropharyngeal exudate.  Eyes: Conjunctivae and EOM are normal. Pupils are equal, round, and reactive to light. Right eye exhibits no discharge. Left eye exhibits no discharge. No scleral icterus.  Neck: Neck supple. No JVD present. No thyromegaly present.  Cardiovascular: Normal rate, regular rhythm, normal heart sounds and intact distal pulses.   No murmur heard. Pulmonary/Chest: Effort normal and breath sounds normal. No respiratory distress. She has no wheezes. She has no rales. She exhibits no tenderness.  Abdominal: Soft. Bowel sounds are normal. She exhibits no distension and no mass. There is no tenderness. There is no rebound and no guarding.  Genitourinary:  Bimanual normal  Musculoskeletal: Normal range of motion. She exhibits no edema.  Lymphadenopathy:    She has no cervical adenopathy.  Neurological: She is alert and oriented to person, place, and time. She has normal reflexes. She displays normal reflexes. No cranial nerve deficit. Coordination normal.  Skin: Skin is warm and dry. No rash noted. She is not diaphoretic.  Psychiatric: She has a normal mood and affect. Her behavior is normal. Judgment and thought content normal.          Assessment & Plan:    Hypertension-well controlled  GE reflux     Glucose tolerance-treated with metformin  History of positional vertigo  History of dependent edema  History of adenomatous colon polyps  Plan: Annual mammogram. Refill medications for 6 months if pharmacy calls. Return in 6 months for office visit, hemoglobin A1c and blood pressure check. Subjective:   Patient presents for Medicare Annual/Subsequent preventive examination.   Review Past Medical/Family/Social:  See above   Risk Factors  Current exercise  habits:- walk, mow yard, housework  Dietary issues discussed: watch diet doing better with food choices  Cardiac risk factors: Obesity , HTN, DM, Heart disease in both  parents  Depression Screen  (Note: if answer to either of the following is "Yes", a more complete depression screening is indicated)  Over the past two weeks, have you felt down, depressed or hopeless? no Over the past two weeks, have you felt little interest or pleasure in doing things? no Have you lost interest or pleasure in daily life? Yes  Do you often feel hopeless? no Do you cry easily over simple problems? No   Activities of Daily Living  In your present state of health, do you have any difficulty performing the following activities?:  Driving? No  Managing money? No  Feeding yourself? No  Getting from bed to chair? No  Climbing a flight of stairs? No  Preparing food and eating?: No  Bathing or showering? No  Getting dressed: No  Getting to the toilet? No  Using the toilet:No  Moving around from place to place: No  In the past year have you fallen or had a near fall?:No  Are you sexually active? yes Do you have more than one partner? No   Hearing Difficulties: No  Do you often ask people to speak up or repeat themselves? No  Do you experience ringing or noises in your ears? Yes  Do you have difficulty understanding soft or whispered voices? No  Do you feel that you have a problem with memory? Yes  Do you often misplace items? No  Do you feel safe at home? Yes   Cognitive Testing  Alert? Yes Normal Appearance?Yes  Oriented to person? Yes Place? Yes  Time? Yes  Recall of three objects? Yes  Can perform simple calculations? Yes  Displays appropriate judgment?Yes  Can read the correct time from a watch face?Yes   List the Names of Other Physician/Practitioners you currently use: None   Indicate any recent Medical Services you may have received from other than [facility] providers in the past year (date may be approximate).   Screening Tests / Date Colonoscopy August 2010                    Zostavax 2012 Mammogram-done  Influenza Vaccine-done  Tetanus/tdap Eye exam  this year in [place]  Objective:        General appearance: Appears stated age and mildly obese  Head: Normocephalic, without obvious abnormality, atraumatic  Eyes: conj clear, EOMi PEERLA  Ears: normal TM's and external ear canals both ears  Nose: Nares normal. Septum midline. Mucosa normal. No drainage or sinus tenderness.  Throat: lips, mucosa, and tongue normal; teeth and gums normal  Neck: no adenopathy, no carotid bruit, no JVD, supple, symmetrical, trachea midline and thyroid not enlarged, symmetric, no tenderness/mass/nodules  No CVA tenderness.  Lungs: clear to auscultation bilaterally  Breasts: normal appearance, no masses or tenderness, Heart: regular rate and rhythm, S1, S2 normal, no murmur, click, rub or gallop  Abdomen: soft, non-tender; bowel sounds normal; no masses, no organomegaly  Musculoskeletal: ROM normal in all joints, no crepitus, no deformity, Normal muscle strengthen. Back  is symmetric, no curvature. Skin: Skin color, texture, turgor normal. No rashes or lesions  Lymph nodes: Cervical, supraclavicular, and axillary nodes normal.  Neurologic: CN 2 -12 Normal, Normal symmetric reflexes. Normal coordination and gait  Psych: Alert & Oriented x 3, Mood appear stable.    Assessment:  Annual wellness medicare exam   Plan:    Mammogram done this year. Eye exam done last year. Pneumonia shot done this visit. Influenza immunization this visit. Diet review for nutrition referral? Yes ____ Not Indicated __x__  Patient Instructions (the written plan) was given to the patient.  Medicare Attestation  I have personally reviewed:  The patient's medical and social history  Their use of alcohol, tobacco or illicit drugs  Their current medications and supplements  The patient's functional ability including ADLs,fall risks, home safety risks, cognitive, and hearing and visual impairment  Diet and physical activities  Evidence for depression or mood disorders   The patient's weight, height, BMI, and visual acuity have been recorded in the chart. I have made referrals, counseling, and provided education to the patient based on review of the above and I have provided the patient with a written personalized care plan for preventive services.

## 2011-12-30 NOTE — Patient Instructions (Addendum)
Continue same meds and return in 6 months. You will receive flu and pneumonia injections today.

## 2012-01-18 ENCOUNTER — Other Ambulatory Visit: Payer: Self-pay | Admitting: Internal Medicine

## 2012-01-19 ENCOUNTER — Other Ambulatory Visit: Payer: Self-pay

## 2012-01-19 MED ORDER — AMLODIPINE BESYLATE 10 MG PO TABS: 10.0000 mg | ORAL_TABLET | Freq: Every day | ORAL | Status: DC

## 2012-04-12 ENCOUNTER — Other Ambulatory Visit: Payer: Self-pay | Admitting: Internal Medicine

## 2012-06-13 ENCOUNTER — Other Ambulatory Visit: Payer: Self-pay | Admitting: Internal Medicine

## 2012-06-19 ENCOUNTER — Other Ambulatory Visit: Payer: Medicare Other | Admitting: Internal Medicine

## 2012-06-21 ENCOUNTER — Ambulatory Visit: Payer: Medicare Other | Admitting: Internal Medicine

## 2012-06-25 ENCOUNTER — Other Ambulatory Visit: Payer: Self-pay | Admitting: Internal Medicine

## 2012-07-06 ENCOUNTER — Encounter: Payer: Self-pay | Admitting: Internal Medicine

## 2012-07-06 ENCOUNTER — Ambulatory Visit (INDEPENDENT_AMBULATORY_CARE_PROVIDER_SITE_OTHER): Payer: Medicare Other | Admitting: Internal Medicine

## 2012-07-06 VITALS — BP 136/84 | HR 64 | Temp 97.7°F | Wt 188.0 lb

## 2012-07-06 DIAGNOSIS — R609 Edema, unspecified: Secondary | ICD-10-CM

## 2012-07-06 DIAGNOSIS — I10 Essential (primary) hypertension: Secondary | ICD-10-CM

## 2012-07-06 DIAGNOSIS — E785 Hyperlipidemia, unspecified: Secondary | ICD-10-CM

## 2012-07-06 DIAGNOSIS — E119 Type 2 diabetes mellitus without complications: Secondary | ICD-10-CM

## 2012-07-06 LAB — LIPID PANEL
Cholesterol: 189 mg/dL (ref 0–200)
Total CHOL/HDL Ratio: 3.7 Ratio
Triglycerides: 134 mg/dL (ref <150)
VLDL: 27 mg/dL (ref 0–40)

## 2012-07-06 MED ORDER — LOSARTAN POTASSIUM 100 MG PO TABS: ORAL_TABLET | ORAL | Status: DC

## 2012-07-06 MED ORDER — TORSEMIDE 20 MG PO TABS: ORAL_TABLET | ORAL | Status: DC

## 2012-07-06 MED ORDER — AMLODIPINE BESYLATE 10 MG PO TABS: 10.0000 mg | ORAL_TABLET | Freq: Every day | ORAL | Status: DC

## 2012-07-06 NOTE — Progress Notes (Signed)
  Subjective:    Patient ID: Kathleen Greer, female    DOB: Sep 10, 1945, 67 y.o.   MRN: [id number]  HPI 22 year Black female for 6 month recheck on DM, HTN and dependent edema.  Will see optometrist in [place] in May. Labs drawn today and are pending. No complaints. No longer has foster children. Have refilled antihypertensive medications today by e-scribe.    Review of Systems     Objective:   Physical Exam Skin warm and dry. Diabetic foot exam without ulcers or calluses and pulses are normal. Chest is clear to auscultation. Cardiac exam regular rate and rhythm normal S1 and S2. Neck is supple without JVD thyromegaly or carotid bruits.        Assessment & Plan:  Type 2 diabetes mellitus controlled with diet- does not check Accu-Cheks. Hemoglobin A1c pending  Hypertension-increased losartan from 50-100 mg daily. Continue torsemide and Norvasc 10 mg daily.  Dependent edema-stable with torsemide  Plan: Return in 2 months for office visit blood pressure check and be met.

## 2012-07-06 NOTE — Patient Instructions (Addendum)
Increase Losartan to 100 mg daily and return in 6 months.

## 2012-07-07 LAB — BASIC METABOLIC PANEL
BUN: 16 mg/dL (ref 6–23)
CO2: 27 mEq/L (ref 19–32)
Calcium: 8.8 mg/dL (ref 8.4–10.5)
Creat: 0.91 mg/dL (ref 0.50–1.10)
Glucose, Bld: 146 mg/dL — ABNORMAL HIGH (ref 70–99)

## 2012-07-22 ENCOUNTER — Other Ambulatory Visit: Payer: Self-pay | Admitting: Internal Medicine

## 2012-09-24 ENCOUNTER — Other Ambulatory Visit: Payer: Self-pay

## 2012-09-24 DIAGNOSIS — Z1231 Encounter for screening mammogram for malignant neoplasm of breast: Secondary | ICD-10-CM

## 2012-10-08 ENCOUNTER — Encounter: Payer: Self-pay | Admitting: Internal Medicine

## 2012-10-08 ENCOUNTER — Ambulatory Visit (INDEPENDENT_AMBULATORY_CARE_PROVIDER_SITE_OTHER): Payer: Medicare Other | Admitting: Internal Medicine

## 2012-10-08 VITALS — BP 128/84 | HR 72 | Wt 186.0 lb

## 2012-10-08 DIAGNOSIS — I10 Essential (primary) hypertension: Secondary | ICD-10-CM

## 2012-10-08 LAB — BASIC METABOLIC PANEL
CO2: 29 mEq/L (ref 19–32)
Calcium: 9.2 mg/dL (ref 8.4–10.5)
Potassium: 3.6 mEq/L (ref 3.5–5.3)
Sodium: 142 mEq/L (ref 135–145)

## 2012-10-08 MED ORDER — TORSEMIDE 20 MG PO TABS: ORAL_TABLET | ORAL | Status: DC

## 2012-10-08 NOTE — Progress Notes (Signed)
  Subjective:    Patient ID: Kathleen Greer, female    DOB: June 06, 1945, 67 y.o.   MRN: [id number]  HPI  At last visit increased Losartan to 100 mg from 50 mg daily. BP is better. However with hot weather feet are swelling slightly. Nonpitting edema but noticeable. Feels OK otherwise.    Review of Systems     Objective:   Physical Exam Chest clear to Auscultation. Cor RRR  ;   Ext 1 + nonpitting edema        Assessment & Plan:  HTN Dependent edema Plan:  Increase Torsemide to 40 mg daily during the summer. Check potassium in 4 weeks without office visit

## 2012-10-26 ENCOUNTER — Ambulatory Visit
Admission: RE | Admit: 2012-10-26 | Discharge: 2012-10-26 | Disposition: A | Discharge status: Home / Self Care | Payer: Medicare Other | Source: Ambulatory Visit

## 2012-10-26 DIAGNOSIS — Z1231 Encounter for screening mammogram for malignant neoplasm of breast: Secondary | ICD-10-CM

## 2012-11-02 ENCOUNTER — Ambulatory Visit (INDEPENDENT_AMBULATORY_CARE_PROVIDER_SITE_OTHER): Payer: Medicare Other | Admitting: Internal Medicine

## 2012-11-02 ENCOUNTER — Encounter: Payer: Self-pay | Admitting: Internal Medicine

## 2012-11-02 VITALS — BP 144/82 | HR 72 | Temp 98.4°F | Wt 186.5 lb

## 2012-11-02 DIAGNOSIS — L259 Unspecified contact dermatitis, unspecified cause: Secondary | ICD-10-CM

## 2012-11-02 DIAGNOSIS — R21 Rash and other nonspecific skin eruption: Secondary | ICD-10-CM

## 2012-11-02 MED ORDER — METHYLPREDNISOLONE ACETATE 80 MG/ML IJ SUSP
80.0000 mg | Freq: Once | INTRAMUSCULAR | Status: AC
  Administered 2012-11-02: 80 mg via INTRAMUSCULAR

## 2012-11-12 ENCOUNTER — Other Ambulatory Visit: Payer: Medicare Other | Admitting: Internal Medicine

## 2012-11-12 DIAGNOSIS — E876 Hypokalemia: Secondary | ICD-10-CM

## 2012-11-12 LAB — POTASSIUM: Potassium: 3.5 mEq/L (ref 3.5–5.3)

## 2012-11-12 NOTE — Addendum Note (Signed)
Addended by: Judy Pimple on: 11/12/2012 09:59 AM   Modules accepted: Orders

## 2012-11-13 ENCOUNTER — Other Ambulatory Visit: Payer: Self-pay

## 2012-11-13 MED ORDER — POTASSIUM CHLORIDE CRYS ER 20 MEQ PO TBCR: 20.0000 meq | EXTENDED_RELEASE_TABLET | Freq: Two times a day (BID) | ORAL | Status: DC

## 2012-11-13 NOTE — Progress Notes (Signed)
Patient informed. Recheck K on August 18. New rx  Sent to her pharmacy

## 2012-11-18 ENCOUNTER — Encounter: Payer: Self-pay | Admitting: Internal Medicine

## 2012-11-18 NOTE — Patient Instructions (Addendum)
Use triamcinolone cream on rash 3 times a day until resolved call if not better in 7-10 days or sooner if worse

## 2012-11-18 NOTE — Progress Notes (Signed)
  Subjective:    Patient ID: Kathleen Greer, female    DOB: [birth year], 67 y.o.   MRN: [id number]  HPI  patient has rash on right anterior lower leg. She has been working and some flowers around her porch area. Noticed rash after doing that. Doesn't think she saw any poison ivy. Rash is itchy and burns a bit.    Review of Systems     Objective:   Physical Exam Papular erythematous rash right anterior shin area. Does not look like poison ivy. No other rashes or other skin lesions. No other complaints or problems. History of hypertension. Blood pressure elevated today because patient is uncomfortable and anxious.       Assessment & Plan:  Contact dermatitis-this does not look like a photosensitivity reaction  Hypertension  Plan: Triamcinolone cream 0.1% 60 g to use on rash 3 times a day until resolved

## 2012-12-03 ENCOUNTER — Other Ambulatory Visit: Payer: Medicare Other | Admitting: Internal Medicine

## 2012-12-03 DIAGNOSIS — E876 Hypokalemia: Secondary | ICD-10-CM

## 2012-12-05 NOTE — Progress Notes (Signed)
Not taking K+ consistantly now, which should be 2 daily, as her grandson has moved in with her. Will try to be more compliant.

## 2012-12-05 NOTE — Progress Notes (Signed)
Left message

## 2013-01-25 ENCOUNTER — Other Ambulatory Visit: Payer: Medicare Other | Admitting: Internal Medicine

## 2013-01-25 DIAGNOSIS — I10 Essential (primary) hypertension: Secondary | ICD-10-CM

## 2013-01-25 DIAGNOSIS — Z13220 Encounter for screening for lipoid disorders: Secondary | ICD-10-CM

## 2013-01-25 DIAGNOSIS — Z130 Encounter for screening for diseases of the blood and blood-forming organs and certain disorders involving the immune mechanism: Secondary | ICD-10-CM

## 2013-01-25 DIAGNOSIS — Z1329 Encounter for screening for other suspected endocrine disorder: Secondary | ICD-10-CM

## 2013-01-25 DIAGNOSIS — Z131 Encounter for screening for diabetes mellitus: Secondary | ICD-10-CM

## 2013-01-25 LAB — COMPREHENSIVE METABOLIC PANEL
AST: 18 U/L (ref 0–37)
Albumin: 4.1 g/dL (ref 3.5–5.2)
Alkaline Phosphatase: 108 U/L (ref 39–117)
Glucose, Bld: 112 mg/dL — ABNORMAL HIGH (ref 70–99)
Potassium: 3.5 mEq/L (ref 3.5–5.3)
Sodium: 141 mEq/L (ref 135–145)
Total Protein: 7 g/dL (ref 6.0–8.3)

## 2013-01-25 LAB — CBC WITH DIFFERENTIAL/PLATELET
Eosinophils Relative: 6 % — ABNORMAL HIGH (ref 0–5)
HCT: 38.5 % (ref 36.0–46.0)
Hemoglobin: 13.8 g/dL (ref 12.0–15.0)
Lymphocytes Relative: 35 % (ref 12–46)
Lymphs Abs: 2.1 10*3/uL (ref 0.7–4.0)
MCV: 80 fL (ref 78.0–100.0)
Monocytes Absolute: 0.5 10*3/uL (ref 0.1–1.0)
Monocytes Relative: 8 % (ref 3–12)
Neutrophils Relative %: 51 % (ref 43–77)
Platelets: 324 10*3/uL (ref 150–400)
RBC: 4.81 MIL/uL (ref 3.87–5.11)
WBC: 6 10*3/uL (ref 4.0–10.5)

## 2013-01-25 LAB — HEMOGLOBIN A1C
Hgb A1c MFr Bld: 6.5 % — ABNORMAL HIGH (ref <5.7)
Mean Plasma Glucose: 140 mg/dL — ABNORMAL HIGH (ref <117)

## 2013-01-25 LAB — LIPID PANEL
HDL: 51 mg/dL (ref >39)
LDL Cholesterol: 88 mg/dL (ref 0–99)

## 2013-01-25 LAB — TSH: TSH: 2.752 u[IU]/mL (ref 0.350–4.500)

## 2013-01-26 LAB — VITAMIN D 25 HYDROXY (VIT D DEFICIENCY, FRACTURES): Vit D, 25-Hydroxy: 41 ng/mL (ref 30–89)

## 2013-01-28 ENCOUNTER — Encounter: Payer: Self-pay | Admitting: Internal Medicine

## 2013-01-28 ENCOUNTER — Ambulatory Visit (INDEPENDENT_AMBULATORY_CARE_PROVIDER_SITE_OTHER): Payer: Medicare Other | Admitting: Internal Medicine

## 2013-01-28 ENCOUNTER — Other Ambulatory Visit (HOSPITAL_COMMUNITY)
Admission: RE | Admit: 2013-01-28 | Discharge: 2013-01-28 | Disposition: A | Discharge status: Home / Self Care | Payer: Medicare Other | Source: Ambulatory Visit | Attending: Internal Medicine | Admitting: Internal Medicine

## 2013-01-28 VITALS — BP 140/80 | HR 80 | Temp 99.0°F | Ht 64.5 in | Wt 183.0 lb

## 2013-01-28 DIAGNOSIS — E876 Hypokalemia: Secondary | ICD-10-CM

## 2013-01-28 DIAGNOSIS — Z124 Encounter for screening for malignant neoplasm of cervix: Secondary | ICD-10-CM | POA: Insufficient documentation

## 2013-01-28 DIAGNOSIS — R7302 Impaired glucose tolerance (oral): Secondary | ICD-10-CM

## 2013-01-28 DIAGNOSIS — Z860100 Personal history of colon polyps, unspecified: Secondary | ICD-10-CM

## 2013-01-28 DIAGNOSIS — I10 Essential (primary) hypertension: Secondary | ICD-10-CM

## 2013-01-28 DIAGNOSIS — R7309 Other abnormal glucose: Secondary | ICD-10-CM

## 2013-01-28 DIAGNOSIS — K219 Gastro-esophageal reflux disease without esophagitis: Secondary | ICD-10-CM

## 2013-01-28 DIAGNOSIS — Z0000 Encounter for general adult medical examination without abnormal findings: Secondary | ICD-10-CM

## 2013-01-28 DIAGNOSIS — Z23 Encounter for immunization: Secondary | ICD-10-CM

## 2013-01-28 DIAGNOSIS — R609 Edema, unspecified: Secondary | ICD-10-CM

## 2013-01-28 DIAGNOSIS — M4120 Other idiopathic scoliosis, site unspecified: Secondary | ICD-10-CM

## 2013-01-28 DIAGNOSIS — M419 Scoliosis, unspecified: Secondary | ICD-10-CM

## 2013-01-28 DIAGNOSIS — J309 Allergic rhinitis, unspecified: Secondary | ICD-10-CM

## 2013-01-28 LAB — POCT URINALYSIS DIPSTICK
Bilirubin, UA: NEGATIVE
Glucose, UA: NEGATIVE
Ketones, UA: NEGATIVE
Leukocytes, UA: NEGATIVE
Nitrite, UA: NEGATIVE
Urobilinogen, UA: NEGATIVE
pH, UA: 6

## 2013-01-28 NOTE — Progress Notes (Signed)
Subjective:    Patient ID: Kathleen Greer, female    DOB: Dec 09, 1945, 67 y.o.   MRN: [id number]  HPI  67 year old Black female for health maintenance and evaluation of medical problems. History of hypertension, anxiety, benign positional vertigo, dependent edema, GE reflux, impaired glucose tolerance, history of adenomatous colon polyps. History of scoliosis lumbar spine. History of hypokalemia secondary to diuretic therapy.  New complaint today include some hoarseness. She does have some prescription nasal spray at home which she has not been using. Recommended that plus Zyrtec at bedtime to see if symptoms improve. If not, she needs allergy testing.  She is status post hysterectomy without oophorectomy 1969-09-10, right rotator cuff surgery June 2002, right carpal tunnel release by Dr. Yisroel Ramming 2003.  Zostavax vaccine given September 2012, tetanus immunization April 2011. Pneumovax immunization up-to-date.  Penicillin causes a rash.  Social history: She is retired. She tried foster parenting with a teenage girl that proved to be quite stressful. She formerly worked as a Location manager at [employer]. She is divorced. Does not smoke or consume alcohol. Has one adult son.  No change in Family History. Adult son has hypertension. Patient's father died with an MI. Mother died with an MI. One brother died with heart failure. Another brother died in Sep 10, 2004 with cirrhosis of the liver. One sister died at age 49. Another sister whose health is apparently okay.  Currently keeping 19 month old great grandson because his mother is addicted pain meds and is in rehab.    Review of Systems  Constitutional: Negative for fever, chills, diaphoresis, activity change, appetite change, fatigue and unexpected weight change.  HENT:       Says she sounds hoarse and congested every morning.  Eyes: Negative.   Respiratory: Negative.   Cardiovascular: Negative.   Gastrointestinal:       History of GE reflux  Endocrine:  Negative.   Genitourinary: Negative.   Neurological: Negative.   Hematological: Negative.   Psychiatric/Behavioral:       History of anxiety.       Objective:   Physical Exam  Vitals reviewed. Constitutional: She is oriented to person, place, and time. She appears well-developed and well-nourished. No distress.  HENT:  Head: Normocephalic and atraumatic.  Right Ear: External ear normal.  Left Ear: External ear normal.  Mouth/Throat: Oropharynx is clear and moist. No oropharyngeal exudate.  Eyes: Conjunctivae are normal. Right eye exhibits no discharge. Left eye exhibits no discharge. No scleral icterus.  Neck: Neck supple. No JVD present. No thyromegaly present.  Cardiovascular: Normal rate, regular rhythm, normal heart sounds and intact distal pulses.   No murmur heard. Pulmonary/Chest: Effort normal and breath sounds normal. No respiratory distress. She has no wheezes. She has no rales. She exhibits no tenderness.  Breasts normal female  Abdominal: Soft. Bowel sounds are normal. She exhibits no distension and no mass. There is no tenderness. There is no rebound and no guarding.  Genitourinary:  Status post hysterectomy. Pap taken of vaginal cuff. Bimanual exam is normal. In the future we'll just a bimanual exam.  Musculoskeletal: Normal range of motion. She exhibits no edema.  Lymphadenopathy:    She has no cervical adenopathy.  Neurological: She is alert and oriented to person, place, and time. She has normal reflexes. No cranial nerve deficit. Coordination normal.  Skin: Skin is warm and dry. No rash noted. She is not diaphoretic.  Psychiatric: She has a normal mood and affect. Her behavior is normal. Judgment and thought content  normal.          Assessment & Plan:  Hypertension  Dependent edema  Hypokalemia secondary to diuretic therapy and she is on potassium supplement  Impaired glucose tolerance  History of adenomatous colon polyp  History of GE  reflux  History of benign positional vertigo  Probable allergic rhinitis causing hoarseness-recommend using prescription nasal spray and taking Zyrtec at bedtime. If no results, see allergist  Plan: Return in 6 months. Flu vaccine given. Continue same medications. If hoarseness persists, see allergist    Subjective:   Patient presents for Medicare Annual/Subsequent preventive examination.   Review Past Medical/Family/Social: see EPIC   Risk Factors  Current exercise habits: some walk, housekeeping Dietary issues discussed: low fat low carb  Cardiac risk factors: HTN  Depression Screen  (Note: if answer to either of the following is "Yes", a more complete depression screening is indicated)   Over the past two weeks, have you felt down, depressed or hopeless? No  Over the past two weeks, have you felt little interest or pleasure in doing things? No Have you lost interest or pleasure in daily life? No Do you often feel hopeless? No Do you cry easily over simple problems? No   Activities of Daily Living  In your present state of health, do you have any difficulty performing the following activities?:   Driving? No  Managing money? No  Feeding yourself? No  Getting from bed to chair? No  Climbing a flight of stairs? No  Preparing food and eating?: No  Bathing or showering? No  Getting dressed: No  Getting to the toilet? No  Using the toilet:No  Moving around from place to place: No  In the past year have you fallen or had a near fall?:No  Are you sexually active? No  Do you have more than one partner? No   Hearing Difficulties: No  Do you often ask people to speak up or repeat themselves? No  Do you experience ringing or noises in your ears? No  Do you have difficulty understanding soft or whispered voices? No  Do you feel that you have a problem with memory? No Do you often misplace items? No    Home Safety:  Do you have a smoke alarm at your residence? Yes Do  you have grab bars in the bathroom? no Do you have throw rugs in your house? no   Cognitive Testing  Alert? Yes Normal Appearance?Yes  Oriented to person? Yes Place? Yes  Time? Yes  Recall of three objects? Yes  Can perform simple calculations? Yes  Displays appropriate judgment?Yes  Can read the correct time from a watch face?Yes   List the Names of Other Physician/Practitioners you currently use:  See referral list for the physicians patient is currently seeing. None    Review of Systems:see above   Objective:     General appearance: Appears stated age and mildly obese  Head: Normocephalic, without obvious abnormality, atraumatic  Eyes: conj clear, EOMi PEERLA  Ears: normal TM's and external ear canals both ears  Nose: Nares normal. Septum midline. Mucosa normal. No drainage or sinus tenderness.  Throat: lips, mucosa, and tongue normal; teeth and gums normal  Neck: no adenopathy, no carotid bruit, no JVD, supple, symmetrical, trachea midline and thyroid not enlarged, symmetric, no tenderness/mass/nodules  No CVA tenderness.  Lungs: clear to auscultation bilaterally  Breasts: normal appearance, no masses or tenderness Heart: regular rate and rhythm, S1, S2 normal, no murmur, click, rub or gallop  Abdomen: soft, non-tender; bowel sounds normal; no masses, no organomegaly  Musculoskeletal: ROM normal in all joints, no crepitus, no deformity, Normal muscle strengthen. Back  is symmetric, no curvature. Skin: Skin color, texture, turgor normal. No rashes or lesions  Lymph nodes: Cervical, supraclavicular, and axillary nodes normal.  Neurologic: CN 2 -12 Normal, Normal symmetric reflexes. Normal coordination and gait  Psych: Alert & Oriented x 3, Mood appear stable.    Assessment:    Annual wellness medicare exam   Plan:    During the course of the visit the patient was educated and counseled about appropriate screening and preventive services including:   Mammogram up  to date Flu vaccine given today Colonoscopy up to date [place] 2014     Patient Instructions (the written plan) was given to the patient.  Medicare Attestation  I have personally reviewed:  The patient's medical and social history  Their use of alcohol, tobacco or illicit drugs  Their current medications and supplements  The patient's functional ability including ADLs,fall risks, home safety risks, cognitive, and hearing and visual impairment  Diet and physical activities  Evidence for depression or mood disorders  The patient's weight, height, BMI, and visual acuity have been recorded in the chart. I have made referrals, counseling, and provided education to the patient based on review of the above and I have provided the patient with a written personalized care plan for preventive services.

## 2013-01-28 NOTE — Patient Instructions (Signed)
Continue same medications and return in 6 months.  Flu vaccine given today. 

## 2013-03-11 ENCOUNTER — Ambulatory Visit: Payer: Medicare Other | Admitting: Internal Medicine

## 2013-05-07 ENCOUNTER — Other Ambulatory Visit: Payer: Self-pay | Admitting: Internal Medicine

## 2013-06-02 ENCOUNTER — Other Ambulatory Visit: Payer: Self-pay | Admitting: Internal Medicine

## 2013-07-30 ENCOUNTER — Other Ambulatory Visit: Payer: Medicare Other | Admitting: Internal Medicine

## 2013-07-30 DIAGNOSIS — E119 Type 2 diabetes mellitus without complications: Secondary | ICD-10-CM

## 2013-07-30 DIAGNOSIS — I10 Essential (primary) hypertension: Secondary | ICD-10-CM

## 2013-07-30 LAB — BASIC METABOLIC PANEL
BUN: 18 mg/dL (ref 6–23)
CALCIUM: 9.1 mg/dL (ref 8.4–10.5)
CO2: 35 meq/L — AB (ref 19–32)
Chloride: 101 mEq/L (ref 96–112)
Creat: 1.08 mg/dL (ref 0.50–1.10)
GLUCOSE: 130 mg/dL — AB (ref 70–99)
Potassium: 3.9 mEq/L (ref 3.5–5.3)
SODIUM: 143 meq/L (ref 135–145)

## 2013-07-30 LAB — HEMOGLOBIN A1C
HEMOGLOBIN A1C: 6.9 % — AB (ref <5.7)
MEAN PLASMA GLUCOSE: 151 mg/dL — AB (ref <117)

## 2013-08-01 ENCOUNTER — Encounter: Payer: Self-pay | Admitting: Internal Medicine

## 2013-08-01 ENCOUNTER — Encounter: Payer: Medicare Other | Admitting: Internal Medicine

## 2013-08-01 ENCOUNTER — Other Ambulatory Visit: Payer: Self-pay

## 2013-08-01 VITALS — BP 152/88 | HR 72 | Temp 98.0°F | Wt 191.0 lb

## 2013-08-01 MED ORDER — TORSEMIDE 20 MG PO TABS: ORAL_TABLET | ORAL | Status: DC

## 2013-08-01 MED ORDER — POTASSIUM CHLORIDE CRYS ER 20 MEQ PO TBCR: 20.0000 meq | EXTENDED_RELEASE_TABLET | Freq: Two times a day (BID) | ORAL | Status: DC

## 2013-08-01 MED ORDER — OMEPRAZOLE 20 MG PO CPDR: 20.0000 mg | DELAYED_RELEASE_CAPSULE | Freq: Every day | ORAL | Status: DC

## 2013-08-15 ENCOUNTER — Ambulatory Visit (INDEPENDENT_AMBULATORY_CARE_PROVIDER_SITE_OTHER): Payer: Medicare Other | Admitting: Internal Medicine

## 2013-08-15 ENCOUNTER — Encounter: Payer: Self-pay | Admitting: Internal Medicine

## 2013-08-15 VITALS — BP 130/84 | HR 72 | Temp 98.5°F | Wt 188.5 lb

## 2013-08-15 DIAGNOSIS — I10 Essential (primary) hypertension: Secondary | ICD-10-CM

## 2013-08-15 DIAGNOSIS — E119 Type 2 diabetes mellitus without complications: Secondary | ICD-10-CM

## 2013-08-15 DIAGNOSIS — J069 Acute upper respiratory infection, unspecified: Secondary | ICD-10-CM

## 2013-08-15 MED ORDER — AZITHROMYCIN 250 MG PO TABS: ORAL_TABLET | ORAL | Status: DC

## 2013-08-15 MED ORDER — METFORMIN HCL 1000 MG PO TABS: 1000.0000 mg | ORAL_TABLET | Freq: Every day | ORAL | Status: DC

## 2013-08-15 NOTE — Progress Notes (Signed)
   Subjective:    Patient ID: Kathleen Greer, female    DOB: 01/27/1946, 68 y.o.   MRN: [id number]  HPI Did not take antihypertensive medication before coming to office today. Reschedule for 2 weeks. Patient to take medication for coming to office.    Review of Systems     Objective:   Physical Exam        Assessment & Plan:

## 2013-08-15 NOTE — Progress Notes (Signed)
   Subjective:    Patient ID: Kathleen Greer, female    DOB: 05/14/1945, 68 y.o.   MRN: [id number]  HPI 68 year old black female in today for six-month recheck on diabetes and hypertension. Blood pressure under good control today. Last visit she has not taken blood pressure medication before coming here. Today he has upper respiratory infection. Has been keeping 67-year-old grandson and has come down with upper respiratory infection symptoms that will not go away. No fever or chills. No cough just nasally congested. Cannot take decongestants because of hypertension. She is on 3 drug regimen for hypertension. Hemoglobin A1c has gone up a bit in the past few months. She's on metformin 500 mg daily.    Review of Systems     Objective:   Physical Exam Skin warm and dry. Nodes none. Chest clear to auscultation. Cardiac exam regular rate and rhythm normal S1 and S2. Extremities without edema.        Assessment & Plan:  Type 2 diabetes mellitus-controlled increase metformin to 1000 mg daily every morning and reassess in 6 months  Hypertension-stable on 3 drug regimen  Upper respiratory infection-prescribe Zithromax 2 by mouth day one followed by 1 by mouth days 2 through 5  Plan: Return in 6 months for physical exam and lab work.

## 2013-08-15 NOTE — Patient Instructions (Signed)
Take Zithromax as directed for respiratory infection. Increase metformin to 1000 mg daily. Continue blood pressure medications. Return in 6 months for physical exam

## 2013-08-17 ENCOUNTER — Other Ambulatory Visit: Payer: Self-pay | Admitting: Orthopedic Surgery

## 2013-09-10 ENCOUNTER — Encounter (HOSPITAL_COMMUNITY): Admission: RE | Payer: Self-pay | Source: Ambulatory Visit

## 2013-09-10 ENCOUNTER — Ambulatory Visit (HOSPITAL_COMMUNITY): Admission: RE | Admit: 2013-09-10 | Payer: Medicare Other | Source: Ambulatory Visit | Admitting: Orthopedic Surgery

## 2013-09-10 SURGERY — ARTHROSCOPY, KNEE
Anesthesia: Choice | Site: Knee | Laterality: Left

## 2013-09-24 ENCOUNTER — Other Ambulatory Visit: Payer: Self-pay

## 2013-09-24 DIAGNOSIS — Z1231 Encounter for screening mammogram for malignant neoplasm of breast: Secondary | ICD-10-CM

## 2013-10-28 ENCOUNTER — Ambulatory Visit
Admission: RE | Admit: 2013-10-28 | Discharge: 2013-10-28 | Disposition: A | Discharge status: Home / Self Care | Payer: Medicare Other | Source: Ambulatory Visit

## 2013-10-28 DIAGNOSIS — Z1231 Encounter for screening mammogram for malignant neoplasm of breast: Secondary | ICD-10-CM

## 2013-10-30 ENCOUNTER — Encounter: Payer: Self-pay | Admitting: Internal Medicine

## 2013-11-08 ENCOUNTER — Encounter: Payer: Self-pay | Admitting: Internal Medicine

## 2013-12-26 ENCOUNTER — Other Ambulatory Visit: Payer: Self-pay

## 2013-12-26 MED ORDER — LOSARTAN POTASSIUM 100 MG PO TABS: ORAL_TABLET | ORAL | Status: DC

## 2013-12-31 ENCOUNTER — Ambulatory Visit (AMBULATORY_SURGERY_CENTER): Payer: Self-pay | Admitting: *Deleted

## 2013-12-31 VITALS — Ht 64.0 in | Wt 182.4 lb

## 2013-12-31 DIAGNOSIS — Z86010 Personal history of colonic polyps: Secondary | ICD-10-CM

## 2013-12-31 MED ORDER — MOVIPREP 100 G PO SOLR
ORAL | Status: DC
Start: 2013-12-31 — End: 2014-01-14

## 2013-12-31 NOTE — Progress Notes (Signed)
No allergies to eggs or soy. No problems with anesthesia.  Pt given Emmi instructions for colonoscopy  No oxygen use  No diet drug use  

## 2014-01-01 ENCOUNTER — Telehealth: Payer: Self-pay | Admitting: Internal Medicine

## 2014-01-01 NOTE — Telephone Encounter (Signed)
Pt states her prep is too expensive for her to be able to afford and she states the pharmacy told her we should have a coupon for her to get her prep free or discounted.  Called [facility] in [place] and faxed  prep coupon to pharmacy at their request. They stated they cannot take the info over the phone.  Pt aware ewm  Bufford Spikes, RN

## 2014-01-03 ENCOUNTER — Telehealth: Payer: Self-pay

## 2014-01-03 NOTE — Telephone Encounter (Signed)
Call from the pharmacist. She cannot read the faxed voucher for the Moviprep. Had to use another voucher in order for the patient to receive the benefit. Issue is resolved.Patient will be able to get her prep.

## 2014-01-09 ENCOUNTER — Encounter: Payer: Self-pay | Admitting: Internal Medicine

## 2014-01-14 ENCOUNTER — Encounter: Payer: Self-pay | Admitting: Internal Medicine

## 2014-01-14 ENCOUNTER — Ambulatory Visit (AMBULATORY_SURGERY_CENTER): Payer: Medicare Other | Admitting: Internal Medicine

## 2014-01-14 VITALS — BP 140/104 | HR 64 | Temp 97.7°F | Resp 20 | Ht 64.0 in | Wt 182.0 lb

## 2014-01-14 DIAGNOSIS — Z86010 Personal history of colonic polyps: Secondary | ICD-10-CM

## 2014-01-14 LAB — GLUCOSE, CAPILLARY
GLUCOSE-CAPILLARY: 109 mg/dL — AB (ref 70–99)
GLUCOSE-CAPILLARY: 102 mg/dL — AB (ref 70–99)

## 2014-01-14 MED ORDER — SODIUM CHLORIDE 0.9 % IV SOLN: 500.0000 mL | INTRAVENOUS | Status: DC

## 2014-01-14 NOTE — Op Note (Signed)
[facility] [address] [place], [id number]   COLONOSCOPY PROCEDURE REPORT  PATIENT: Kathleen PortlandWilhite, Kathleen Greer  MR#: [id number] BIRTHDATE: [birth year] , 68  yrs. old GENDER: female ENDOSCOPIST: Roxy CedarJohn N Raley Novicki Jr, MD REFERRED NF:[id number]:Surveillance Program Recall PROCEDURE DATE:  01/14/2014 PROCEDURE:   Colonoscopy, surveillance First Screening Colonoscopy - Avg.  risk and is 50 yrs.  old or older - No.  Prior Negative Screening - Now for repeat screening. N/A  History of Adenoma - Now for follow-up colonoscopy & has been > or = to 3 yrs.  Yes hx of adenoma.  Has been 3 or more years since last colonoscopy.  Polyps Removed Today? No.  Polyps Removed Today? No.  Recommend repeat exam, <10 yrs? No. ASA CLASS:   Class II INDICATIONS:surveillance colonoscopy based on a history of adenomatous colonic polyp(s). Index exam 2002 with large tubular adenoma. Followup exam 2005 negative. Most recent followup 2010 small tubular adenoma. MEDICATIONS: Monitored anesthesia care and Propofol 250 mg IV  DESCRIPTION OF PROCEDURE:   After the risks benefits and alternatives of the procedure were thoroughly explained, informed consent was obtained.  The digital rectal exam revealed no abnormalities of the rectum.   The LB [id number]  endoscope was introduced through the anus and advanced to the cecum, which was identified by both the appendix and ileocecal valve. No adverse events experienced.   The quality of the prep was excellent, using MoviPrep  The instrument was then slowly withdrawn as the colon was fully examined.      COLON FINDINGS: A normal appearing cecum, ileocecal valve, and appendiceal orifice were identified.  The ascending, transverse, descending, sigmoid colon, and rectum appeared unremarkable. Retroflexed views revealed internal hemorrhoids. The time to cecum=5 minutes 57 seconds.  Withdrawal time=8 minutes 19 seconds. The scope was withdrawn and the procedure  completed. COMPLICATIONS: There were no immediate complications.  ENDOSCOPIC IMPRESSION: 1.Normal colonoscopy  RECOMMENDATIONS: 1.Continue current colorectal surveillance recommendations with a repeat colonoscopy in 10 years.  eSigned:  Roxy CedarJohn N Ka Bench Jr, MD 01/14/2014 11:56 AM   cc: The Patient and Sharlet SalinaMary Wyoma Genson Baxley, MD

## 2014-01-14 NOTE — Patient Instructions (Signed)
Discharge instructions given with verbal understanding. Normal exam. Resume previous medications. YOU HAD AN ENDOSCOPIC PROCEDURE TODAY AT THE [facility]: Refer to the procedure report that was given to you for any specific questions about what was found during the examination.  If the procedure report does not answer your questions, please call your gastroenterologist to clarify.  If you requested that your care partner not be given the details of your procedure findings, then the procedure report has been included in a sealed envelope for you to review at your convenience later.  YOU SHOULD EXPECT: Some feelings of bloating in the abdomen. Passage of more gas than usual.  Walking can help get rid of the air that was put into your GI tract during the procedure and reduce the bloating. If you had a lower endoscopy (such as a colonoscopy or flexible sigmoidoscopy) you may notice spotting of blood in your stool or on the toilet paper. If you underwent a bowel prep for your procedure, then you may not have a normal bowel movement for a few days.  DIET: Your first meal following the procedure should be a light meal and then it is ok to progress to your normal diet.  A half-sandwich or bowl of soup is an example of a good first meal.  Heavy or fried foods are harder to digest and may make you feel nauseous or bloated.  Likewise meals heavy in dairy and vegetables can cause extra gas to form and this can also increase the bloating.  Drink plenty of fluids but you should avoid alcoholic beverages for 24 hours.  ACTIVITY: Your care partner should take you home directly after the procedure.  You should plan to take it easy, moving slowly for the rest of the day.  You can resume normal activity the day after the procedure however you should NOT DRIVE or use heavy machinery for 24 hours (because of the sedation medicines used during the test).    SYMPTOMS TO REPORT IMMEDIATELY: A gastroenterologist  can be reached at any hour.  During normal business hours, 8:30 AM to 5:00 PM Monday through Friday, call [phone].  After hours and on weekends, please call the GI answering service at [phone] who will take a message and have the physician on call contact you.   Following lower endoscopy (colonoscopy or flexible sigmoidoscopy):  Excessive amounts of blood in the stool  Significant tenderness or worsening of abdominal pains  Swelling of the abdomen that is new, acute  Fever of 100F or higher  FOLLOW UP: If any biopsies were taken you will be contacted by phone or by letter within the next 1-3 weeks.  Call your gastroenterologist if you have not heard about the biopsies in 3 weeks.  Our staff will call the home number listed on your records the next business day following your procedure to check on you and address any questions or concerns that you may have at that time regarding the information given to you following your procedure. This is a courtesy call and so if there is no answer at the home number and we have not heard from you through the emergency physician on call, we will assume that you have returned to your regular daily activities without incident.  SIGNATURES/CONFIDENTIALITY: You and/or your care partner have signed paperwork which will be entered into your electronic medical record.  These signatures attest to the fact that that the information above on your After Visit Summary has been reviewed   and is understood.  Full responsibility of the confidentiality of this discharge information lies with you and/or your care-partner. 

## 2014-01-14 NOTE — Progress Notes (Signed)
Report to PACU, RN, vss, BBS= Clear.  

## 2014-01-15 ENCOUNTER — Telehealth: Payer: Self-pay

## 2014-01-15 NOTE — Telephone Encounter (Signed)
  Follow up Call-  Call back number 01/14/2014  Post procedure Call Back phone  # [phone]  Permission to leave phone message Yes     Patient questions:  Do you have a fever, pain , or abdominal swelling? No. Pain Score  0 *  Have you tolerated food without any problems? Yes.    Have you been able to return to your normal activities? Yes.    Do you have any questions about your discharge instructions: Diet   No. Medications  No. Follow up visit  No.  Do you have questions or concerns about your Care? No.  Actions: * If pain score is 4 or above: No action needed, pain <4.

## 2014-01-21 ENCOUNTER — Telehealth: Payer: Self-pay

## 2014-01-21 NOTE — Telephone Encounter (Signed)
Pt stated that she had an eye exam last year.  She is unsure of the results.  She could not remember the name of her ophthalmologist at the time of call.  Therefore, she was asked to call her ophthalmologist and have them fax a copy of her results to [phone].  She agreed.

## 2014-02-12 ENCOUNTER — Other Ambulatory Visit: Payer: Self-pay

## 2014-02-12 MED ORDER — METFORMIN HCL 1000 MG PO TABS: 1000.0000 mg | ORAL_TABLET | Freq: Every day | ORAL | Status: DC

## 2014-02-17 ENCOUNTER — Other Ambulatory Visit: Payer: Medicare Other | Admitting: Internal Medicine

## 2014-02-17 DIAGNOSIS — R7309 Other abnormal glucose: Secondary | ICD-10-CM

## 2014-02-17 DIAGNOSIS — I10 Essential (primary) hypertension: Secondary | ICD-10-CM

## 2014-02-17 DIAGNOSIS — Z130 Encounter for screening for diseases of the blood and blood-forming organs and certain disorders involving the immune mechanism: Secondary | ICD-10-CM

## 2014-02-17 DIAGNOSIS — E119 Type 2 diabetes mellitus without complications: Secondary | ICD-10-CM

## 2014-02-17 DIAGNOSIS — Z1321 Encounter for screening for nutritional disorder: Secondary | ICD-10-CM

## 2014-02-17 DIAGNOSIS — Z0000 Encounter for general adult medical examination without abnormal findings: Secondary | ICD-10-CM

## 2014-02-17 LAB — COMPREHENSIVE METABOLIC PANEL
ALK PHOS: 116 U/L (ref 39–117)
ALT: 28 U/L (ref 0–35)
AST: 21 U/L (ref 0–37)
Albumin: 4.4 g/dL (ref 3.5–5.2)
BUN: 14 mg/dL (ref 6–23)
CO2: 28 mEq/L (ref 19–32)
Calcium: 8.9 mg/dL (ref 8.4–10.5)
Chloride: 102 mEq/L (ref 96–112)
Creat: 1.01 mg/dL (ref 0.50–1.10)
Glucose, Bld: 115 mg/dL — ABNORMAL HIGH (ref 70–99)
POTASSIUM: 4 meq/L (ref 3.5–5.3)
Sodium: 144 mEq/L (ref 135–145)
Total Bilirubin: 1 mg/dL (ref 0.2–1.2)
Total Protein: 7 g/dL (ref 6.0–8.3)

## 2014-02-17 LAB — CBC WITH DIFFERENTIAL/PLATELET
BASOS ABS: 0.1 10*3/uL (ref 0.0–0.1)
BASOS PCT: 1 % (ref 0–1)
Eosinophils Absolute: 0.3 10*3/uL (ref 0.0–0.7)
Eosinophils Relative: 6 % — ABNORMAL HIGH (ref 0–5)
HCT: 39 % (ref 36.0–46.0)
Hemoglobin: 13.6 g/dL (ref 12.0–15.0)
Lymphocytes Relative: 34 % (ref 12–46)
Lymphs Abs: 1.9 10*3/uL (ref 0.7–4.0)
MCH: 27.5 pg (ref 26.0–34.0)
MCHC: 34.9 g/dL (ref 30.0–36.0)
MCV: 78.8 fL (ref 78.0–100.0)
MONO ABS: 0.4 10*3/uL (ref 0.1–1.0)
Monocytes Relative: 8 % (ref 3–12)
NEUTROS ABS: 2.8 10*3/uL (ref 1.7–7.7)
Neutrophils Relative %: 51 % (ref 43–77)
PLATELETS: 318 10*3/uL (ref 150–400)
RBC: 4.95 MIL/uL (ref 3.87–5.11)
RDW: 14.7 % (ref 11.5–15.5)
WBC: 5.5 10*3/uL (ref 4.0–10.5)

## 2014-02-17 LAB — LIPID PANEL
Cholesterol: 181 mg/dL (ref 0–200)
HDL: 49 mg/dL (ref >39)
LDL Cholesterol: 104 mg/dL — ABNORMAL HIGH (ref 0–99)
TRIGLYCERIDES: 142 mg/dL (ref <150)
Total CHOL/HDL Ratio: 3.7 Ratio
VLDL: 28 mg/dL (ref 0–40)

## 2014-02-17 LAB — TSH: TSH: 1.685 u[IU]/mL (ref 0.350–4.500)

## 2014-02-18 ENCOUNTER — Encounter: Payer: Self-pay | Admitting: Internal Medicine

## 2014-02-18 ENCOUNTER — Ambulatory Visit (INDEPENDENT_AMBULATORY_CARE_PROVIDER_SITE_OTHER): Payer: Medicare Other | Admitting: Internal Medicine

## 2014-02-18 VITALS — BP 130/80 | HR 84 | Temp 97.3°F | Ht 64.0 in | Wt 179.0 lb

## 2014-02-18 DIAGNOSIS — E119 Type 2 diabetes mellitus without complications: Secondary | ICD-10-CM

## 2014-02-18 DIAGNOSIS — Z0000 Encounter for general adult medical examination without abnormal findings: Secondary | ICD-10-CM

## 2014-02-18 DIAGNOSIS — K219 Gastro-esophageal reflux disease without esophagitis: Secondary | ICD-10-CM

## 2014-02-18 DIAGNOSIS — R609 Edema, unspecified: Secondary | ICD-10-CM

## 2014-02-18 DIAGNOSIS — E876 Hypokalemia: Secondary | ICD-10-CM

## 2014-02-18 DIAGNOSIS — I10 Essential (primary) hypertension: Secondary | ICD-10-CM

## 2014-02-18 DIAGNOSIS — Z23 Encounter for immunization: Secondary | ICD-10-CM

## 2014-02-18 LAB — POCT URINALYSIS DIPSTICK
Bilirubin, UA: NEGATIVE
Glucose, UA: NEGATIVE
Ketones, UA: NEGATIVE
LEUKOCYTES UA: NEGATIVE
Nitrite, UA: NEGATIVE
PH UA: 7
PROTEIN UA: NEGATIVE
RBC UA: NEGATIVE
Urobilinogen, UA: NEGATIVE

## 2014-02-18 LAB — HEMOGLOBIN A1C
Hgb A1c MFr Bld: 6.3 % — ABNORMAL HIGH (ref <5.7)
Mean Plasma Glucose: 134 mg/dL — ABNORMAL HIGH (ref <117)

## 2014-02-18 LAB — VITAMIN D 25 HYDROXY (VIT D DEFICIENCY, FRACTURES): Vit D, 25-Hydroxy: 43 ng/mL (ref 30–89)

## 2014-02-18 NOTE — Progress Notes (Signed)
Subjective:    Patient ID: Kathleen Greer, female    DOB: 03/23/1946, 68 y.o.   MRN: [id number]  HPI  68 year old Black Female for health maintenance and evaluation of medical issues. Hx of hypertension, anxiety, benign positional vertigo, dependent edema, GE reflux, impaired glucose tolerance, adenomatous colon polyps. History of scoliosis of the lumbar spine. History of hypokalemia secondary to diuretic therapy.  She is status post hysterectomy without oophorectomy 1971, right rotator cuff surgery June 2002, right carpal tunnel release by Dr. Yisroel Rammingaldorf 2003.  Zostavax vaccine September 2012, tetanus immunization April 2011, Pneumovax immunization up-to-date  Penicillin causes a rash  Social history: She is retired. She tried [employer] parent teen with a teenage girl that proved to be stressful. Now keeps great-grandson as mother has history of drug abuse. 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 She is divorced. Does not smoke or consume alcohol. Has one adult son.  Family history: Adult son has hypertension. Patient's father died with an MI. Mother died with an MI. One brother died with heart failure. Another brother died in 2006 with cirrhosis of the liver. One sister died at age 67. Another sister whose health is apparently okay.        Review of Systems  Constitutional: Positive for fatigue.  HENT: Negative.   Respiratory: Negative.   Cardiovascular: Negative.   Gastrointestinal: Negative.   Genitourinary: Negative.   Neurological: Negative.   Psychiatric/Behavioral: Negative.        Objective:   Physical Exam  Constitutional: She is oriented to person, place, and time. She appears well-developed and well-nourished. No distress.  HENT:  Head: Normocephalic and atraumatic.  Right Ear: External ear normal.  Left Ear: External ear normal.  Mouth/Throat: Oropharynx is clear and moist. No oropharyngeal exudate.  Eyes: Conjunctivae are normal. Pupils are equal,  round, and reactive to light. Left eye exhibits no discharge. No scleral icterus.  Neck: Neck supple. No JVD present. No thyromegaly present.  Cardiovascular: Normal rate, regular rhythm, normal heart sounds and intact distal pulses.   No murmur heard. Pulmonary/Chest: Effort normal and breath sounds normal. No respiratory distress. She has no wheezes. She has no rales. She exhibits no tenderness.  Breasts normal female  Abdominal: Soft. Bowel sounds are normal. She exhibits no distension and no mass. There is no rebound and no guarding.  Genitourinary:  Bimanual exam normal. Hysterectomy done in 1971  Musculoskeletal: She exhibits no edema.  Lymphadenopathy:    She has no cervical adenopathy.  Neurological: She is alert and oriented to person, place, and time. She has normal reflexes. She displays normal reflexes. No cranial nerve deficit. Coordination normal.  Skin: Skin is warm and dry. No rash noted. She is not diaphoretic.  Psychiatric: She has a normal mood and affect. Her behavior is normal. Judgment and thought content normal.  Vitals reviewed.         Assessment & Plan:  Hypertension-stable on current regimen  Anxiety-stable  Dependent edema-under good control  GE reflux treated with PPI  Impaired glucose tolerance treated with diet  History of adenomatous colon polyps  History of hypokalemia secondary to diuretic therapy-stable on potassium replacement  History of benign positional vertigo which is intermittent  Plan: Continue same medications and return in 6 months.  Subjective:   Patient presents for Medicare Annual/Subsequent preventive examination.  Review Past Medical/Family/Social: See above   Risk Factors  Current exercise habits: Sedentary Dietary issues discussed: Low fat low car  Cardiac risk factors:  Hypertension and family history  Depression Screen  (Note: if answer to either of the following is "Yes", a more complete depression screening  is indicated)   Over the past two weeks, have you felt down, depressed or hopeless? No  Over the past two weeks, have you felt little interest or pleasure in doing things? No Have you lost interest or pleasure in daily life? No Do you often feel hopeless? No Do you cry easily over simple problems? No   Activities of Daily Living  In your present state of health, do you have any difficulty performing the following activities?:   Driving? No  Managing money? No  Feeding yourself? No  Getting from bed to chair? No  Climbing a flight of stairs? No  Preparing food and eating?: No  Bathing or showering? No  Getting dressed: No  Getting to the toilet? No  Using the toilet:No  Moving around from place to place: No  In the past year have you fallen or had a near fall?:No  Are you sexually active? yes Do you have more than one partner? No   Hearing Difficulties: No  Do you often ask people to speak up or repeat themselves? No  Do you experience ringing or noises in your ears? No  Do you have difficulty understanding soft or whispered voices? Sometimes Do you feel that you have a problem with memory? No Do you often misplace items? No    Home Safety:  Do you have a smoke alarm at your residence? Yes Do you have grab bars in the bathroom? No Do you have throw rugs in your house? Yes   Cognitive Testing  Alert? Yes Normal Appearance?Yes  Oriented to person? Yes Place? Yes  Time? Yes  Recall of three objects? Yes  Can perform simple calculations? Yes  Displays appropriate judgment?Yes  Can read the correct time from a watch face?Yes   List the Names of Other Physician/Practitioners you currently use:  See referral list for the physicians patient is currently seeing.     Review of Systems: See above   Objective:     General appearance: Appears stated age and mildly obese  Head: Normocephalic, without obvious abnormality, atraumatic  Eyes: conj clear, EOMi PEERLA  Ears:  normal TM's and external ear canals both ears  Nose: Nares normal. Septum midline. Mucosa normal. No drainage or sinus tenderness.  Throat: lips, mucosa, and tongue normal; teeth and gums normal  Neck: no adenopathy, no carotid bruit, no JVD, supple, symmetrical, trachea midline and thyroid not enlarged, symmetric, no tenderness/mass/nodules  No CVA tenderness.  Lungs: clear to auscultation bilaterally  Breasts: normal appearance, no masses or tenderness Heart: regular rate and rhythm, S1, S2 normal, no murmur, click, rub or gallop  Abdomen: soft, non-tender; bowel sounds normal; no masses, no organomegaly  Musculoskeletal: ROM normal in all joints, no crepitus, no deformity, Normal muscle strengthen. Back  is symmetric, no curvature. Skin: Skin color, texture, turgor normal. No rashes or lesions  Lymph nodes: Cervical, supraclavicular, and axillary nodes normal.  Neurologic: CN 2 -12 Normal, Normal symmetric reflexes. Normal coordination and gait  Psych: Alert & Oriented x 3, Mood appear stable.    Assessment:    Annual wellness medicare exam   Plan:    During the course of the visit the patient was educated and counseled about appropriate screening and preventive services including:   Annual mammogram  Anuli exam   Patient Instructions (the written plan) was given to the patient.  Medicare Attestation  I have personally reviewed:  The patient's medical and social history  Their use of alcohol, tobacco or illicit drugs  Their current medications and supplements  The patient's functional ability including ADLs,fall risks, home safety risks, cognitive, and hearing and visual impairment  Diet and physical activities  Evidence for depression or mood disorders  The patient's weight, height, BMI, and visual acuity have been recorded in the chart. I have made referrals, counseling, and provided education to the patient based on review of the above and I have provided the patient with  a written personalized care plan for preventive services.

## 2014-02-24 ENCOUNTER — Other Ambulatory Visit: Payer: Self-pay

## 2014-02-24 MED ORDER — LOSARTAN POTASSIUM 100 MG PO TABS: ORAL_TABLET | ORAL | Status: DC

## 2014-02-24 MED ORDER — METFORMIN HCL 1000 MG PO TABS
1000.0000 mg | ORAL_TABLET | Freq: Every day | ORAL | Status: DC
Start: 2014-02-24 — End: 2014-04-02

## 2014-04-02 ENCOUNTER — Other Ambulatory Visit: Payer: Self-pay

## 2014-04-02 MED ORDER — LOSARTAN POTASSIUM 100 MG PO TABS: ORAL_TABLET | ORAL | Status: DC

## 2014-04-02 MED ORDER — AMLODIPINE BESYLATE 10 MG PO TABS: 10.0000 mg | ORAL_TABLET | Freq: Every day | ORAL | Status: DC

## 2014-04-02 MED ORDER — OMEPRAZOLE 20 MG PO CPDR: 20.0000 mg | DELAYED_RELEASE_CAPSULE | Freq: Every day | ORAL | Status: DC

## 2014-04-02 MED ORDER — TORSEMIDE 20 MG PO TABS: ORAL_TABLET | ORAL | Status: DC

## 2014-04-02 MED ORDER — METFORMIN HCL 1000 MG PO TABS: 1000.0000 mg | ORAL_TABLET | Freq: Every day | ORAL | Status: DC

## 2014-05-24 NOTE — Patient Instructions (Addendum)
Continue same medications and return in 6 months. Have annual mammogram. Have annual diabetic eye exam.

## 2014-07-29 ENCOUNTER — Other Ambulatory Visit: Payer: Medicare Other | Admitting: Internal Medicine

## 2014-07-31 ENCOUNTER — Ambulatory Visit: Payer: Medicare Other | Admitting: Internal Medicine

## 2014-08-07 ENCOUNTER — Other Ambulatory Visit: Payer: Medicare Other | Admitting: Internal Medicine

## 2014-08-07 DIAGNOSIS — Z79899 Other long term (current) drug therapy: Secondary | ICD-10-CM

## 2014-08-07 DIAGNOSIS — E119 Type 2 diabetes mellitus without complications: Secondary | ICD-10-CM

## 2014-08-07 LAB — BASIC METABOLIC PANEL
BUN: 17 mg/dL (ref 6–23)
CALCIUM: 9.1 mg/dL (ref 8.4–10.5)
CO2: 27 mEq/L (ref 19–32)
Chloride: 103 mEq/L (ref 96–112)
Creat: 1.12 mg/dL — ABNORMAL HIGH (ref 0.50–1.10)
GLUCOSE: 170 mg/dL — AB (ref 70–99)
POTASSIUM: 4 meq/L (ref 3.5–5.3)
SODIUM: 141 meq/L (ref 135–145)

## 2014-08-07 LAB — HEMOGLOBIN A1C
HEMOGLOBIN A1C: 6.3 % — AB (ref <5.7)
Mean Plasma Glucose: 134 mg/dL — ABNORMAL HIGH (ref <117)

## 2014-08-08 ENCOUNTER — Ambulatory Visit (INDEPENDENT_AMBULATORY_CARE_PROVIDER_SITE_OTHER): Payer: Medicare Other | Admitting: Internal Medicine

## 2014-08-08 ENCOUNTER — Other Ambulatory Visit: Payer: Self-pay | Admitting: *Deleted

## 2014-08-08 ENCOUNTER — Encounter: Payer: Self-pay | Admitting: Internal Medicine

## 2014-08-08 VITALS — BP 136/78 | HR 72 | Temp 97.8°F | Ht 64.0 in | Wt 178.0 lb

## 2014-08-08 DIAGNOSIS — Z23 Encounter for immunization: Secondary | ICD-10-CM

## 2014-08-08 DIAGNOSIS — E8881 Metabolic syndrome: Secondary | ICD-10-CM

## 2014-08-08 DIAGNOSIS — R748 Abnormal levels of other serum enzymes: Secondary | ICD-10-CM | POA: Diagnosis not present

## 2014-08-08 DIAGNOSIS — R490 Dysphonia: Secondary | ICD-10-CM | POA: Diagnosis not present

## 2014-08-08 DIAGNOSIS — R7989 Other specified abnormal findings of blood chemistry: Secondary | ICD-10-CM

## 2014-08-08 DIAGNOSIS — I10 Essential (primary) hypertension: Secondary | ICD-10-CM

## 2014-08-08 DIAGNOSIS — E119 Type 2 diabetes mellitus without complications: Secondary | ICD-10-CM | POA: Diagnosis not present

## 2014-08-08 LAB — COMPLETE METABOLIC PANEL WITHOUT GFR
ALT: 19 U/L (ref 0–35)
AST: 18 U/L (ref 0–37)
Albumin: 4.4 g/dL (ref 3.5–5.2)
Alkaline Phosphatase: 103 U/L (ref 39–117)
BUN: 17 mg/dL (ref 6–23)
CO2: 32 meq/L (ref 19–32)
Calcium: 9.4 mg/dL (ref 8.4–10.5)
Chloride: 103 meq/L (ref 96–112)
Creat: 1.07 mg/dL (ref 0.50–1.10)
GFR, Est African American: 62 mL/min
GFR, Est Non African American: 54 mL/min — ABNORMAL LOW
Glucose, Bld: 102 mg/dL — ABNORMAL HIGH (ref 70–99)
Potassium: 4.2 meq/L (ref 3.5–5.3)
Sodium: 143 meq/L (ref 135–145)
Total Bilirubin: 0.7 mg/dL (ref 0.2–1.2)
Total Protein: 7.3 g/dL (ref 6.0–8.3)

## 2014-08-08 MED ORDER — POTASSIUM CHLORIDE CRYS ER 20 MEQ PO TBCR: 20.0000 meq | EXTENDED_RELEASE_TABLET | Freq: Two times a day (BID) | ORAL | Status: DC

## 2014-08-08 NOTE — Progress Notes (Signed)
   Subjective:    Patient ID: Kathleen Greer, female    DOB: 08/25/1945, 69 y.o.   MRN: [id number]  HPI For 6 month recheck. [place]. [facility] but was unable to keep appt and needs to reschedule. Having some issues with night driving. Was told she had cataracts. Recent labs show elevated creatinine. Here today to follow-up on type 2 diabetes mellitus and essential hypertension. Serum creatinine is elevated at 1.12. Never had this before. We are going to repeat this today with a GFR estimated. She may be developing some chronic kidney disease. She is complaining of some hoarseness of her voice. Have recommended Flonase nasal spray and Zyrtec at bedtime. We can try this for 2-3 weeks and see if symptoms improve. Otherwise feels well. Sounds slightly hoarse when she's weeks    Review of Systems     Objective:   Physical Exam Skin warm and dry. Nodes: None. Neck supple without JVD thyromegaly or carotid bruits. Chest clear. Cardiac exam regular rate and rhythm. Extremities without pitting edema.       Assessment & Plan:  Controlled type 2 diabetes. Hemoglobin A1c 6.3%. Continue metformin  Visual changes-to see ophthalmologist  History of anxiety  History of GE reflux-takes reflux medication-omeprazole  ? Chronic kidney disease-repeat creatinine today with estimated GFR  Prevnar given today  Hoarseness-try Flonase nasal spray and Zyrtec. Continue PPI.  Plan: Return in 6 months for physical examination. See ophthalmologist. Hulen LusterPrevnar given.

## 2014-08-08 NOTE — Telephone Encounter (Signed)
Refill on klorcon sent to pharmacy

## 2014-08-08 NOTE — Patient Instructions (Addendum)
Prevnar given. Creatinine repeated. Return in 6 months for physical examination. Continue same medications. See ophthalmologist. Leanora Ivanoffakes Zyrtec and use Flonase nasal spray

## 2014-08-09 LAB — MICROALBUMIN / CREATININE URINE RATIO
CREATININE, URINE: 35.7 mg/dL
MICROALB UR: 0.2 mg/dL (ref <2.0)
MICROALB/CREAT RATIO: 5.6 mg/g (ref 0.0–30.0)

## 2014-09-05 ENCOUNTER — Encounter: Payer: Self-pay | Admitting: Internal Medicine

## 2014-09-23 ENCOUNTER — Other Ambulatory Visit: Payer: Self-pay

## 2014-09-23 DIAGNOSIS — Z1231 Encounter for screening mammogram for malignant neoplasm of breast: Secondary | ICD-10-CM

## 2014-11-03 ENCOUNTER — Ambulatory Visit
Admission: RE | Admit: 2014-11-03 | Discharge: 2014-11-03 | Disposition: A | Discharge status: Home / Self Care | Payer: Medicare Other | Source: Ambulatory Visit

## 2014-11-03 DIAGNOSIS — Z1231 Encounter for screening mammogram for malignant neoplasm of breast: Secondary | ICD-10-CM | POA: Diagnosis not present

## 2014-11-28 ENCOUNTER — Other Ambulatory Visit: Payer: Self-pay | Admitting: *Deleted

## 2014-11-28 MED ORDER — POTASSIUM CHLORIDE CRYS ER 20 MEQ PO TBCR: 20.0000 meq | EXTENDED_RELEASE_TABLET | Freq: Two times a day (BID) | ORAL | Status: DC

## 2014-11-28 NOTE — Telephone Encounter (Signed)
kdur script sent to mail order pharmacy at patient request

## 2015-02-23 ENCOUNTER — Other Ambulatory Visit: Payer: Medicare Other | Admitting: Internal Medicine

## 2015-02-23 DIAGNOSIS — E8881 Metabolic syndrome: Secondary | ICD-10-CM

## 2015-02-23 DIAGNOSIS — I10 Essential (primary) hypertension: Secondary | ICD-10-CM

## 2015-02-23 DIAGNOSIS — R748 Abnormal levels of other serum enzymes: Secondary | ICD-10-CM

## 2015-02-23 DIAGNOSIS — E119 Type 2 diabetes mellitus without complications: Secondary | ICD-10-CM

## 2015-02-23 DIAGNOSIS — M810 Age-related osteoporosis without current pathological fracture: Secondary | ICD-10-CM

## 2015-02-23 DIAGNOSIS — Z0000 Encounter for general adult medical examination without abnormal findings: Secondary | ICD-10-CM | POA: Diagnosis not present

## 2015-02-23 LAB — COMPLETE METABOLIC PANEL WITH GFR
ALBUMIN: 4.3 g/dL (ref 3.6–5.1)
ALT: 17 U/L (ref 6–29)
AST: 17 U/L (ref 10–35)
Alkaline Phosphatase: 113 U/L (ref 33–130)
BUN: 20 mg/dL (ref 7–25)
CALCIUM: 9.1 mg/dL (ref 8.6–10.4)
CHLORIDE: 101 mmol/L (ref 98–110)
CO2: 28 mmol/L (ref 20–31)
Creat: 1 mg/dL — ABNORMAL HIGH (ref 0.50–0.99)
GFR, EST AFRICAN AMERICAN: 67 mL/min (ref >=60)
GFR, Est Non African American: 58 mL/min — ABNORMAL LOW (ref >=60)
GLUCOSE: 110 mg/dL — AB (ref 65–99)
Potassium: 4.3 mmol/L (ref 3.5–5.3)
Sodium: 143 mmol/L (ref 135–146)
Total Bilirubin: 1 mg/dL (ref 0.2–1.2)
Total Protein: 7.2 g/dL (ref 6.1–8.1)

## 2015-02-23 LAB — CBC WITH DIFFERENTIAL/PLATELET
Basophils Absolute: 0 10*3/uL (ref 0.0–0.1)
Basophils Relative: 0 % (ref 0–1)
EOS PCT: 5 % (ref 0–5)
Eosinophils Absolute: 0.3 10*3/uL (ref 0.0–0.7)
HEMATOCRIT: 40.5 % (ref 36.0–46.0)
HEMOGLOBIN: 14.4 g/dL (ref 12.0–15.0)
LYMPHS ABS: 1.9 10*3/uL (ref 0.7–4.0)
LYMPHS PCT: 37 % (ref 12–46)
MCH: 28.2 pg (ref 26.0–34.0)
MCHC: 35.6 g/dL (ref 30.0–36.0)
MCV: 79.3 fL (ref 78.0–100.0)
MPV: 9.4 fL (ref 8.6–12.4)
Monocytes Absolute: 0.4 10*3/uL (ref 0.1–1.0)
Monocytes Relative: 8 % (ref 3–12)
NEUTROS ABS: 2.5 10*3/uL (ref 1.7–7.7)
Neutrophils Relative %: 50 % (ref 43–77)
Platelets: 324 10*3/uL (ref 150–400)
RBC: 5.11 MIL/uL (ref 3.87–5.11)
RDW: 14.5 % (ref 11.5–15.5)
WBC: 5 10*3/uL (ref 4.0–10.5)

## 2015-02-23 LAB — TSH: TSH: 1.578 u[IU]/mL (ref 0.350–4.500)

## 2015-02-24 ENCOUNTER — Ambulatory Visit (INDEPENDENT_AMBULATORY_CARE_PROVIDER_SITE_OTHER): Payer: Medicare Other | Admitting: Internal Medicine

## 2015-02-24 ENCOUNTER — Encounter: Payer: Self-pay | Admitting: Internal Medicine

## 2015-02-24 VITALS — BP 130/82 | HR 71 | Temp 97.1°F | Resp 18 | Ht 65.0 in | Wt 171.0 lb

## 2015-02-24 DIAGNOSIS — Z86010 Personal history of colonic polyps: Secondary | ICD-10-CM

## 2015-02-24 DIAGNOSIS — Z0000 Encounter for general adult medical examination without abnormal findings: Secondary | ICD-10-CM

## 2015-02-24 DIAGNOSIS — E119 Type 2 diabetes mellitus without complications: Secondary | ICD-10-CM | POA: Diagnosis not present

## 2015-02-24 DIAGNOSIS — Z23 Encounter for immunization: Secondary | ICD-10-CM | POA: Diagnosis not present

## 2015-02-24 DIAGNOSIS — K219 Gastro-esophageal reflux disease without esophagitis: Secondary | ICD-10-CM

## 2015-02-24 DIAGNOSIS — R609 Edema, unspecified: Secondary | ICD-10-CM | POA: Diagnosis not present

## 2015-02-24 DIAGNOSIS — I10 Essential (primary) hypertension: Secondary | ICD-10-CM

## 2015-02-24 DIAGNOSIS — E876 Hypokalemia: Secondary | ICD-10-CM

## 2015-02-24 LAB — POCT URINALYSIS DIPSTICK
Bilirubin, UA: NEGATIVE
GLUCOSE UA: NEGATIVE
Ketones, UA: NEGATIVE
Leukocytes, UA: NEGATIVE
NITRITE UA: NEGATIVE
Protein, UA: NEGATIVE
RBC UA: NEGATIVE
Spec Grav, UA: 1.02
Urobilinogen, UA: 0.2
pH, UA: 6.5

## 2015-02-24 LAB — HEMOGLOBIN A1C
HEMOGLOBIN A1C: 6.5 % — AB (ref <5.7)
MEAN PLASMA GLUCOSE: 140 mg/dL — AB (ref <117)

## 2015-02-24 LAB — VITAMIN D 25 HYDROXY (VIT D DEFICIENCY, FRACTURES): Vit D, 25-Hydroxy: 35 ng/mL (ref 30–100)

## 2015-02-24 LAB — MICROALBUMIN, URINE: Microalb, Ur: 0.8 mg/dL

## 2015-02-24 NOTE — Patient Instructions (Signed)
It was pleasure to see you today. Continue same medications. Watch diet and try to exercise. Return in 6 months. Flu vaccine given today.
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
## 2015-04-07 DIAGNOSIS — Z7984 Long term (current) use of oral hypoglycemic drugs: Secondary | ICD-10-CM | POA: Diagnosis not present

## 2015-04-07 DIAGNOSIS — E119 Type 2 diabetes mellitus without complications: Secondary | ICD-10-CM | POA: Diagnosis not present

## 2015-04-07 DIAGNOSIS — H2513 Age-related nuclear cataract, bilateral: Secondary | ICD-10-CM | POA: Diagnosis not present

## 2015-04-24 ENCOUNTER — Other Ambulatory Visit: Payer: Self-pay | Admitting: Internal Medicine

## 2015-05-22 DIAGNOSIS — H2513 Age-related nuclear cataract, bilateral: Secondary | ICD-10-CM | POA: Diagnosis not present

## 2015-05-28 DIAGNOSIS — H2513 Age-related nuclear cataract, bilateral: Secondary | ICD-10-CM | POA: Diagnosis not present

## 2015-05-28 DIAGNOSIS — H2511 Age-related nuclear cataract, right eye: Secondary | ICD-10-CM | POA: Diagnosis not present

## 2015-06-11 DIAGNOSIS — H2512 Age-related nuclear cataract, left eye: Secondary | ICD-10-CM | POA: Diagnosis not present

## 2015-07-09 DIAGNOSIS — Z961 Presence of intraocular lens: Secondary | ICD-10-CM | POA: Diagnosis not present

## 2015-07-14 ENCOUNTER — Other Ambulatory Visit: Payer: Self-pay | Admitting: Internal Medicine

## 2015-09-01 ENCOUNTER — Other Ambulatory Visit: Payer: Self-pay | Admitting: Internal Medicine

## 2015-09-01 ENCOUNTER — Other Ambulatory Visit: Payer: Medicare Other | Admitting: Internal Medicine

## 2015-09-01 DIAGNOSIS — E119 Type 2 diabetes mellitus without complications: Secondary | ICD-10-CM | POA: Diagnosis not present

## 2015-09-01 DIAGNOSIS — Z1159 Encounter for screening for other viral diseases: Secondary | ICD-10-CM | POA: Diagnosis not present

## 2015-09-01 LAB — HEMOGLOBIN A1C
Hgb A1c MFr Bld: 6.2 % — ABNORMAL HIGH (ref <5.7)
Mean Plasma Glucose: 131 mg/dL

## 2015-09-04 ENCOUNTER — Encounter: Payer: Self-pay | Admitting: Internal Medicine

## 2015-09-04 ENCOUNTER — Ambulatory Visit (INDEPENDENT_AMBULATORY_CARE_PROVIDER_SITE_OTHER): Payer: Medicare Other | Admitting: Internal Medicine

## 2015-09-04 VITALS — BP 120/82 | HR 74 | Temp 98.0°F | Resp 18 | Ht 65.0 in | Wt 176.0 lb

## 2015-09-04 DIAGNOSIS — I10 Essential (primary) hypertension: Secondary | ICD-10-CM | POA: Diagnosis not present

## 2015-09-04 DIAGNOSIS — R7302 Impaired glucose tolerance (oral): Secondary | ICD-10-CM

## 2015-09-04 DIAGNOSIS — E669 Obesity, unspecified: Secondary | ICD-10-CM | POA: Diagnosis not present

## 2015-09-04 LAB — HEPATITIS C ANTIBODY: HCV Ab: NEGATIVE

## 2015-09-04 LAB — HIV ANTIBODY (ROUTINE TESTING W REFLEX): HIV 1&2 Ab, 4th Generation: NONREACTIVE

## 2015-09-04 NOTE — Progress Notes (Signed)
   Subjective:    Patient ID: Kathleen Greer, female    DOB: 03/26/1946, 70 y.o.   MRN: [id number]  HPI 70 year old Female for 6 month recheck. Hx of impaired glucose tolerance with improvement of hemoglobin A1c from 6.5% to 6.2% of the past 6 months. She has essential hypertension in require several medications to control it. Blood pressure is excellent on current regimen. No new complaints or problems. Feels well. Has appointment see eye physician June 6. Has been having some coryza after cataract surgery.    Review of Systems see above     Objective:   Physical Exam Skin warm and dry. Nodes none. Neck is supple without JVD thyromegaly or carotid bruits. Chest clear to auscultation. Cardiac exam regular rate and rhythm normal S1 and S2. Extremity is without edema. Alert and oriented 3.       Assessment & Plan:  Essential hypertension-stable on current regimen  Impaired glucose tolerance-she will have an A1c excellent at 6.2%. Continue metformin.exercise  Coryza-to see eye physician in June  Plan: Continue same medications and return in 6 months for physical exam.  She agrees to be screened for hepatitis C and HIV but is low risk

## 2015-09-04 NOTE — Patient Instructions (Signed)
It was a pleasure to see you today. Continue same medication and return in 6 months. See eye physician in June.

## 2015-09-16 DIAGNOSIS — R21 Rash and other nonspecific skin eruption: Secondary | ICD-10-CM | POA: Diagnosis not present

## 2015-09-22 DIAGNOSIS — H59033 Cystoid macular edema following cataract surgery, bilateral: Secondary | ICD-10-CM | POA: Diagnosis not present

## 2015-09-22 DIAGNOSIS — H26493 Other secondary cataract, bilateral: Secondary | ICD-10-CM | POA: Diagnosis not present

## 2015-09-25 ENCOUNTER — Other Ambulatory Visit: Payer: Self-pay

## 2015-09-25 MED ORDER — AMLODIPINE BESYLATE 10 MG PO TABS: ORAL_TABLET | ORAL | Status: DC

## 2015-09-25 MED ORDER — LOSARTAN POTASSIUM 100 MG PO TABS: 100.0000 mg | ORAL_TABLET | Freq: Every day | ORAL | Status: DC

## 2015-09-28 DIAGNOSIS — H35353 Cystoid macular degeneration, bilateral: Secondary | ICD-10-CM | POA: Diagnosis not present

## 2015-10-26 ENCOUNTER — Other Ambulatory Visit: Payer: Self-pay | Admitting: Internal Medicine

## 2015-10-26 DIAGNOSIS — H35353 Cystoid macular degeneration, bilateral: Secondary | ICD-10-CM | POA: Diagnosis not present

## 2015-10-26 DIAGNOSIS — Z1231 Encounter for screening mammogram for malignant neoplasm of breast: Secondary | ICD-10-CM

## 2015-11-09 ENCOUNTER — Ambulatory Visit
Admission: RE | Admit: 2015-11-09 | Discharge: 2015-11-09 | Disposition: A | Discharge status: Home / Self Care | Payer: Medicare Other | Source: Ambulatory Visit | Attending: Internal Medicine | Admitting: Internal Medicine

## 2015-11-09 DIAGNOSIS — Z1231 Encounter for screening mammogram for malignant neoplasm of breast: Secondary | ICD-10-CM | POA: Diagnosis not present

## 2015-11-17 ENCOUNTER — Other Ambulatory Visit: Payer: Self-pay | Admitting: Internal Medicine

## 2015-11-27 ENCOUNTER — Telehealth: Payer: Self-pay | Admitting: Internal Medicine

## 2015-11-27 NOTE — Telephone Encounter (Signed)
Spoke with patient and advised that we are placing a Rx in the mail to her today.  She will take this with her to [facility] and they will assist her in selecting the best device per her price selection.  She is to contact her insurance and find out which brand(s) they will cover and which they will not.  Also advised patient that Medicare will only pay for her to test 1 time daily.  She has only been diagnosed with Impaired Glucose Intolerance at this particular time.  She has an A1C of 6.2%.  So, patient has been instructed to only test 1 time per day and to do this before a meal.  Patient will keep a journal of these tests as well.  Patient verbalized understanding of our conversation and these instructions.

## 2015-11-27 NOTE — Telephone Encounter (Signed)
[facility].  However,  in May her Hgb AIC was excelllent at 6.2%. We can mail her diabetic test strips and glucometer Rx. [facility] about which is [facility]. Medicare only allows for testing once a day. She should test before a meal.

## 2015-11-27 NOTE — Telephone Encounter (Signed)
Says that a Home Health Nurse came out and tested her blood sugar and it was elevated.  She would like to start testing her sugar.  Wants to know if you will write her a Rx for a blood glucose monitor.  She would also need testing strips and lancets.    Patient is to contact her insurance company to find out the brand of monitor that they will cover.  Advised that she can let the pharmacist know when she arrives to [facility] the brand name and they will assist her in picking out the monitor brand name and assist with the monitor that is in the price range she wants to spend.    Pharmacy:  [facility] [place]

## 2015-11-30 DIAGNOSIS — H35353 Cystoid macular degeneration, bilateral: Secondary | ICD-10-CM | POA: Diagnosis not present

## 2016-02-03 DIAGNOSIS — E113292 Type 2 diabetes mellitus with mild nonproliferative diabetic retinopathy without macular edema, left eye: Secondary | ICD-10-CM | POA: Diagnosis not present

## 2016-02-03 DIAGNOSIS — H26493 Other secondary cataract, bilateral: Secondary | ICD-10-CM | POA: Diagnosis not present

## 2016-02-03 DIAGNOSIS — H59031 Cystoid macular edema following cataract surgery, right eye: Secondary | ICD-10-CM | POA: Diagnosis not present

## 2016-02-09 DIAGNOSIS — H26491 Other secondary cataract, right eye: Secondary | ICD-10-CM | POA: Diagnosis not present

## 2016-02-25 ENCOUNTER — Other Ambulatory Visit: Payer: Self-pay | Admitting: Internal Medicine

## 2016-02-25 ENCOUNTER — Other Ambulatory Visit: Payer: Medicare Other | Admitting: Internal Medicine

## 2016-02-25 DIAGNOSIS — K219 Gastro-esophageal reflux disease without esophagitis: Secondary | ICD-10-CM | POA: Diagnosis not present

## 2016-02-25 DIAGNOSIS — I10 Essential (primary) hypertension: Secondary | ICD-10-CM | POA: Diagnosis not present

## 2016-02-25 DIAGNOSIS — R7302 Impaired glucose tolerance (oral): Secondary | ICD-10-CM | POA: Diagnosis not present

## 2016-02-25 DIAGNOSIS — F419 Anxiety disorder, unspecified: Secondary | ICD-10-CM

## 2016-02-25 LAB — CBC WITH DIFFERENTIAL/PLATELET
Basophils Absolute: 0 cells/uL (ref 0–200)
Basophils Relative: 0 %
Eosinophils Absolute: 329 cells/uL (ref 15–500)
Eosinophils Relative: 7 %
HCT: 40 % (ref 35.0–45.0)
Hemoglobin: 13.6 g/dL (ref 11.7–15.5)
LYMPHS PCT: 27 %
Lymphs Abs: 1269 cells/uL (ref 850–3900)
MCH: 27.9 pg (ref 27.0–33.0)
MCHC: 34 g/dL (ref 32.0–36.0)
MCV: 82.1 fL (ref 80.0–100.0)
MPV: 9.7 fL (ref 7.5–12.5)
Monocytes Absolute: 423 cells/uL (ref 200–950)
Monocytes Relative: 9 %
NEUTROS PCT: 57 %
Neutro Abs: 2679 cells/uL (ref 1500–7800)
Platelets: 275 10*3/uL (ref 140–400)
RBC: 4.87 MIL/uL (ref 3.80–5.10)
RDW: 14.6 % (ref 11.0–15.0)
WBC: 4.7 10*3/uL (ref 3.8–10.8)

## 2016-02-25 LAB — LIPID PANEL
CHOLESTEROL: 166 mg/dL (ref <200)
HDL: 56 mg/dL (ref >50)
LDL Cholesterol: 92 mg/dL
Total CHOL/HDL Ratio: 3 Ratio (ref <5.0)
Triglycerides: 91 mg/dL (ref <150)
VLDL: 18 mg/dL (ref <30)

## 2016-02-25 LAB — COMPLETE METABOLIC PANEL WITH GFR
ALT: 17 U/L (ref 6–29)
AST: 17 U/L (ref 10–35)
Albumin: 4.4 g/dL (ref 3.6–5.1)
Alkaline Phosphatase: 74 U/L (ref 33–130)
BUN: 12 mg/dL (ref 7–25)
CHLORIDE: 106 mmol/L (ref 98–110)
CO2: 26 mmol/L (ref 20–31)
Calcium: 9.1 mg/dL (ref 8.6–10.4)
Creat: 0.9 mg/dL (ref 0.50–0.99)
GFR, EST AFRICAN AMERICAN: 75 mL/min (ref >=60)
GFR, EST NON AFRICAN AMERICAN: 65 mL/min (ref >=60)
GLUCOSE: 96 mg/dL (ref 65–99)
Potassium: 3.9 mmol/L (ref 3.5–5.3)
SODIUM: 142 mmol/L (ref 135–146)
TOTAL PROTEIN: 7.2 g/dL (ref 6.1–8.1)
Total Bilirubin: 0.9 mg/dL (ref 0.2–1.2)

## 2016-02-25 LAB — CMP AND LIVER
BILIRUBIN DIRECT: 0.2 mg/dL (ref <=0.2)
Indirect Bilirubin: 0.7 mg/dL (ref 0.2–1.2)

## 2016-02-25 LAB — TSH: TSH: 1.55 m[IU]/L

## 2016-02-25 NOTE — Addendum Note (Signed)
Addended by: Thom Ollinger, CINDY D on: 02/25/2016 10:46 AM   Modules accepted: Orders  

## 2016-02-29 ENCOUNTER — Ambulatory Visit (INDEPENDENT_AMBULATORY_CARE_PROVIDER_SITE_OTHER): Payer: Medicare Other | Admitting: Internal Medicine

## 2016-02-29 ENCOUNTER — Telehealth: Payer: Self-pay | Admitting: Internal Medicine

## 2016-02-29 ENCOUNTER — Encounter: Payer: Self-pay | Admitting: Internal Medicine

## 2016-02-29 VITALS — BP 142/88 | HR 70 | Temp 97.9°F | Ht 65.0 in | Wt 171.0 lb

## 2016-02-29 DIAGNOSIS — Z0000 Encounter for general adult medical examination without abnormal findings: Secondary | ICD-10-CM | POA: Diagnosis not present

## 2016-02-29 DIAGNOSIS — Z86010 Personal history of colonic polyps: Secondary | ICD-10-CM | POA: Diagnosis not present

## 2016-02-29 DIAGNOSIS — R609 Edema, unspecified: Secondary | ICD-10-CM

## 2016-02-29 DIAGNOSIS — R7302 Impaired glucose tolerance (oral): Secondary | ICD-10-CM

## 2016-02-29 DIAGNOSIS — J069 Acute upper respiratory infection, unspecified: Secondary | ICD-10-CM | POA: Diagnosis not present

## 2016-02-29 DIAGNOSIS — I10 Essential (primary) hypertension: Secondary | ICD-10-CM | POA: Diagnosis not present

## 2016-02-29 DIAGNOSIS — Z6828 Body mass index (BMI) 28.0-28.9, adult: Secondary | ICD-10-CM

## 2016-02-29 DIAGNOSIS — H26492 Other secondary cataract, left eye: Secondary | ICD-10-CM | POA: Diagnosis not present

## 2016-02-29 DIAGNOSIS — E8881 Metabolic syndrome: Secondary | ICD-10-CM

## 2016-02-29 DIAGNOSIS — K219 Gastro-esophageal reflux disease without esophagitis: Secondary | ICD-10-CM

## 2016-02-29 DIAGNOSIS — Z23 Encounter for immunization: Secondary | ICD-10-CM

## 2016-02-29 DIAGNOSIS — E876 Hypokalemia: Secondary | ICD-10-CM

## 2016-02-29 LAB — POCT URINALYSIS DIPSTICK
Bilirubin, UA: NEGATIVE
Glucose, UA: NEGATIVE
Ketones, UA: NEGATIVE
Leukocytes, UA: NEGATIVE
NITRITE UA: NEGATIVE
PH UA: 6
Protein, UA: NEGATIVE
RBC UA: NEGATIVE
SPEC GRAV UA: 1.015
UROBILINOGEN UA: NEGATIVE

## 2016-02-29 LAB — HEMOGLOBIN A1C
Hgb A1c MFr Bld: 5.9 % — ABNORMAL HIGH (ref <5.7)
MEAN PLASMA GLUCOSE: 123 mg/dL

## 2016-02-29 MED ORDER — AZITHROMYCIN 250 MG PO TABS: ORAL_TABLET | ORAL | 0 refills | Status: DC

## 2016-02-29 MED ORDER — HYDROCODONE-HOMATROPINE 5-1.5 MG/5ML PO SYRP: 5.0000 mL | ORAL_SOLUTION | Freq: Three times a day (TID) | ORAL | 0 refills | Status: DC | PRN

## 2016-02-29 NOTE — Progress Notes (Signed)
Subjective:    Patient ID: Kathleen Greer, female    DOB: 11/27/1945, 70 y.o.   MRN: [id number]  HPI  70 year old Black Female for health maintenance exam and evaluation of medical issues.  Has acute URI onset 3 days with cough and congestion.No fever or chills. Coughing at night.  She has a history of GE reflux, controlled type 2 diabetes mellitus, hypokalemia, dependent edema.  Had colonoscopy September 2015 which was normal with 10 year follow-up recommended. History of adenomatous colon polyps.  History of anxiety which has improved over the past couple of years.  History of scoliosis of the lumbar spine.  Hypokalemia secondary to diuretic therapy.  History of benign positional vertigo.  She is status post hysterectomy without oophorectomy 1971, right rotator cuff surgery June 2002. Right carpal tunnel release by Dr. Yisroel Rammingaldorf 2003.  Zostavax vaccine September 2012. Tetanus immunization April 2011. Prevnar pneumococcal 23 vaccines up-to-date.  Penicillin causes a rash.  Social history: She is retired. She tried to being a foster parent to a teenage girl and that proved to be stressful. Now is keeping her great-grandson who is attending school in [place]. She drives him from [place] to [place] every day to school. His mother has a history of drug abuse. Patient is divorced. 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 She does not smoke or consume alcohol.  Flu vaccine given today.  [place].  Family history: Adult son with hypertension. Patient's father died with an MI. Mother died with an MI. One brother died with heart failure. Another brother died in 2006 with cirrhosis of the liver. One sister died at age 69. Another sister whose health is apparently okay.      Review of Systems  HENT:       URI symptoms consisting of cough and congestion  Eyes: Negative.   Cardiovascular: Negative.   Genitourinary:  Negative.   Neurological: Negative.        Objective:   Physical Exam  Constitutional: She is oriented to person, place, and time. She appears well-developed and well-nourished. No distress.  HENT:  Head: Normocephalic and atraumatic.  Right Ear: External ear normal.  Left Ear: External ear normal.  Mouth/Throat: Oropharynx is clear and moist.  Eyes: Conjunctivae and EOM are normal. Pupils are equal, round, and reactive to light. Right eye exhibits no discharge. Left eye exhibits no discharge.  Neck: Neck supple. No JVD present. No thyromegaly present.  Cardiovascular: Normal rate, regular rhythm, normal heart sounds and intact distal pulses.   No murmur heard. Pulmonary/Chest: She has no wheezes. She has no rales.  Breasts normal female without masses  Abdominal: Soft. Bowel sounds are normal. She exhibits no distension and no mass. There is no tenderness. There is no rebound and no guarding.  Genitourinary:  Genitourinary Comments: Bimanual exam normal. Status post hysterectomy. No need to do Pap smears due to hysterectomy and age.  Musculoskeletal: She exhibits no edema.  Lymphadenopathy:    She has no cervical adenopathy.  Neurological: She is alert and oriented to person, place, and time. She has normal reflexes. No cranial nerve deficit. Coordination normal.  Skin: Skin is warm and dry. No rash noted. She is not diaphoretic.  Psychiatric: She has a normal mood and affect. Her behavior is normal. Judgment and thought content normal.  Vitals reviewed.         Assessment & Plan:  Acute URI. Given Hycodan 1 teaspoon  by mouth every 8 hours when necessary cough and Zithromax Z-PAK take 2 tablets day one followed by 1 tablet days 2 through 5  Essential hypertension  Controlled type 2 diabetes mellitus-Hemoglobin A1c stable at 5.9%  History of dependent edema  History of anxiety-stable  GE reflux-stable  Plan: Flu vaccine given. Zithromax Z-PAK take as directed. Hycodan  1 teaspoon by mouth every 8 hours when necessary cough. Return in 6 months for follow-up. Continue same medications.  Subjective:   Patient presents for Medicare Annual/Subsequent preventive examination.  Review Past Medical/Family/Social:See above   Risk Factors  Current exercise habits: Mostly sedentary except for housework and yard work Dietary issues discussed: Low fat low carbohydrate  Cardiac risk factors:Diabetes and hypertension  Depression Screen  (Note: if answer to either of the following is "Yes", a more complete depression screening is indicated)   Over the past two weeks, have you felt down, depressed or hopeless? No  Over the past two weeks, have you felt little interest or pleasure in doing things? No Have you lost interest or pleasure in daily life? No Do you often feel hopeless? No Do you cry easily over simple problems? No   Activities of Daily Living  In your present state of health, do you have any difficulty performing the following activities?:   Driving? Some issues with vision status post cataract surgery. Has appointment for follow-up today. Managing money? No  Feeding yourself? No  Getting from bed to chair? No  Climbing a flight of stairs? No  Preparing food and eating?: No  Bathing or showering? No  Getting dressed: No  Getting to the toilet? No  Using the toilet:No  Moving around from place to place: No  In the past year have you fallen or had a near fall?:No  Are you sexually active? yes Do you have more than one partner? No   Hearing Difficulties: No  Do you often ask people to speak up or repeat themselves? Yes Do you experience ringing or noises in your ears? No  Do you have difficulty understanding soft or whispered voices? Yes Do you feel that you have a problem with memory? No Do you often misplace items? Occasionally   Home Safety:  Do you have a smoke alarm at your residence? Yes Do you have grab bars in the bathroom?No Do you  have throw rugs in your house? Yes but Taped to floor   Cognitive Testing  Alert? Yes Normal Appearance?Yes  Oriented to person? Yes Place? Yes  Time? Yes  Recall of three objects? Yes  Can perform simple calculations? Yes  Displays appropriate judgment?Yes  Can read the correct time from a watch face?Yes   List the Names of Other Physician/Practitioners you currently use:  See referral list for the physicians patient is currently seeing.     Review of Systems: See above   Objective:     General appearance: Appears stated age and mildly obese  Head: Normocephalic, without obvious abnormality, atraumatic  Eyes: conj clear, EOMi PEERLA  Ears: normal TM's and external ear canals both ears  Nose: Nares normal. Septum midline. Mucosa normal. No drainage or sinus tenderness.  Throat: lips, mucosa, and tongue normal; teeth and gums normal  Neck: no adenopathy, no carotid bruit, no JVD, supple, symmetrical, trachea midline and thyroid not enlarged, symmetric, no tenderness/mass/nodules  No CVA tenderness.  Lungs: clear to auscultation bilaterally  Breasts: normal appearance, no masses or tenderness, top of the pacemaker on left upper chest. Incision well-healed.  It is tender.  Heart: regular rate and rhythm, S1, S2 normal, no murmur, click, rub or gallop  Abdomen: soft, non-tender; bowel sounds normal; no masses, no organomegaly  Musculoskeletal: ROM normal in all joints, no crepitus, no deformity, Normal muscle strengthen. Back  is symmetric, no curvature. Skin: Skin color, texture, turgor normal. No rashes or lesions  Lymph nodes: Cervical, supraclavicular, and axillary nodes normal.  Neurologic: CN 2 -12 Normal, Normal symmetric reflexes. Normal coordination and gait  Psych: Alert & Oriented x 3, Mood appear stable.    Assessment:    Annual wellness medicare exam   Plan:    During the course of the visit the patient was educated and counseled about appropriate screening and  preventive services including:   Annual flu vaccine  Annual mammogram     Patient Instructions (the written plan) was given to the patient.  Medicare Attestation  I have personally reviewed:  The patient's medical and social history  Their use of alcohol, tobacco or illicit drugs  Their current medications and supplements  The patient's functional ability including ADLs,fall risks, home safety risks, cognitive, and hearing and visual impairment  Diet and physical activities  Evidence for depression or mood disorders  The patient's weight, height, BMI, and visual acuity have been recorded in the chart. I have made referrals, counseling, and provided education to the patient based on review of the above and I have provided the patient with a written personalized care plan for preventive services.

## 2016-02-29 NOTE — Telephone Encounter (Signed)
[facility] to add A1C per Dr Lenord FellersBaxley.

## 2016-03-01 ENCOUNTER — Telehealth: Payer: Self-pay | Admitting: Internal Medicine

## 2016-03-01 NOTE — Telephone Encounter (Signed)
Left message informing patient of A1C results.

## 2016-03-01 NOTE — Telephone Encounter (Signed)
-----   Message from Margaree MackintoshMary J Baxley, MD sent at 03/01/2016 10:05 AM EST ----- Hgb AIC has improved to 5,9% from 6.2 %. Keep up the good work with diet and exercise

## 2016-03-17 NOTE — Patient Instructions (Signed)
Takes Zithromax Z-PAK as directed and Hycodan as needed for cough. Continue same medications and return in 6 months

## 2016-04-13 ENCOUNTER — Other Ambulatory Visit: Payer: Self-pay | Admitting: Internal Medicine

## 2016-07-08 ENCOUNTER — Other Ambulatory Visit: Payer: Self-pay

## 2016-07-08 MED ORDER — TORSEMIDE 20 MG PO TABS: ORAL_TABLET | ORAL | 3 refills | Status: DC

## 2016-07-12 ENCOUNTER — Telehealth: Payer: Self-pay | Admitting: Internal Medicine

## 2016-07-12 ENCOUNTER — Encounter (HOSPITAL_COMMUNITY): Payer: Self-pay | Admitting: Emergency Medicine

## 2016-07-12 ENCOUNTER — Emergency Department (HOSPITAL_COMMUNITY)
Admission: EM | Admit: 2016-07-12 | Discharge: 2016-07-12 | Disposition: A | Discharge status: Home / Self Care | Payer: Medicare Other | Attending: Emergency Medicine | Admitting: Emergency Medicine

## 2016-07-12 DIAGNOSIS — Z7982 Long term (current) use of aspirin: Secondary | ICD-10-CM | POA: Insufficient documentation

## 2016-07-12 DIAGNOSIS — I10 Essential (primary) hypertension: Secondary | ICD-10-CM | POA: Insufficient documentation

## 2016-07-12 DIAGNOSIS — Z7984 Long term (current) use of oral hypoglycemic drugs: Secondary | ICD-10-CM | POA: Insufficient documentation

## 2016-07-12 DIAGNOSIS — R040 Epistaxis: Secondary | ICD-10-CM | POA: Diagnosis not present

## 2016-07-12 DIAGNOSIS — Z79899 Other long term (current) drug therapy: Secondary | ICD-10-CM | POA: Diagnosis not present

## 2016-07-12 DIAGNOSIS — E119 Type 2 diabetes mellitus without complications: Secondary | ICD-10-CM | POA: Diagnosis not present

## 2016-07-12 MED ORDER — CEPHALEXIN 500 MG PO CAPS: 500.0000 mg | ORAL_CAPSULE | Freq: Two times a day (BID) | ORAL | 0 refills | Status: DC

## 2016-07-12 MED ORDER — OXYMETAZOLINE HCL 0.05 % NA SOLN
1.0000 | Freq: Once | NASAL | Status: AC
  Administered 2016-07-12: 1 via NASAL
  Filled 2016-07-12: qty 15

## 2016-07-12 NOTE — ED Provider Notes (Addendum)
By signing my name below, I, Avnee Patel, attest that this documentation has been prepared under the direction and in the presence of [place], DO  Electronically Signed: Delton [place], ED Scribe. 07/12/16. 4:04 AM.  TIME SEEN: 3:55 AM  CHIEF COMPLAINT:  Chief Complaint  Patient presents with  . Epistaxis    HPI:  Kathleen Greer is a 71 y.o. female history of hypertension and diabetes who presents to the Emergency Department complaining of intermittent nose bleed from her right nostril x 24 hours. She states she has been coughing up blood clots from post nasal drainage. Also now reports having black stool. Pt notes she takes a baby aspirin daily. No other antiplatelets or anticoagulants. She states she was seen by Dr. Debria Garret? In [facility] yesterday afternoon (pt thinks he is a PCP not ENT).  Reports she had a procedure where he soaked packing with medication and then left in her nose but told her to remove this packing at 2 PM. Bleeding started again just prior to arrival.. No aggravating or alleviating factors noted. Has not been putting anything into her nose. Denies any facial trauma. Did not use any medications prior to arrival. Pt denies any other associated symptoms. No other complaints noted.   ROS: See HPI Constitutional: no fever  Eyes: no drainage  ENT: no runny nose   Cardiovascular:  no chest pain  Resp: no SOB  GI: no vomiting GU: no dysuria Integumentary: no rash  Allergy: no hives  Musculoskeletal: no leg swelling  Neurological: no slurred speech ROS otherwise negative  PAST MEDICAL HISTORY/PAST SURGICAL HISTORY:  Past Medical History:  Diagnosis Date  . Arthritis   . Blood transfusion without reported diagnosis 1970   after hysterectomy  . Dependent edema   . Diabetes ([place])   . Generalized headaches   . GERD (gastroesophageal reflux disease)   . Hyperkalemia   . Hypertension   . IBS (irritable bowel syndrome)   . Positional vertigo   . Scoliosis      MEDICATIONS:  Prior to Admission medications   Medication Sig Start Date End Date Taking? Authorizing Provider  amLODipine (NORVASC) 10 MG tablet TAKE 1 TABLET BY MOUTH  DAILY 04/13/16   Elby Showers, MD  aspirin 81 MG tablet Take 81 mg by mouth daily.      Historical Provider, MD  azithromycin (ZITHROMAX Z-PAK) 250 MG tablet Take 2 tablets (500 mg) on  Day 1,  followed by 1 tablet (250 mg) once daily on Days 2 through 5. 02/29/16   Elby Showers, MD  Blood Glucose Monitoring Suppl (ONE TOUCH ULTRA 2) w/Device KIT  12/08/15   Historical Provider, MD  Cholecalciferol (VITAMIN D) 1000 UNITS capsule 2,000 Units daily.     Historical Provider, MD  HYDROcodone-homatropine (HYCODAN) 5-1.5 MG/5ML syrup Take 5 mLs by mouth every 8 (eight) hours as needed for cough. 02/29/16   Elby Showers, MD  losartan (COZAAR) 100 MG tablet Take 1 tablet (100 mg total) by mouth daily. 09/25/15   Elby Showers, MD  metFORMIN (GLUCOPHAGE) 1000 MG tablet Take 1 tablet by mouth  daily with breakfast 07/14/15   Elby Showers, MD  omeprazole (PRILOSEC) 20 MG capsule Take 1 capsule by mouth  daily 11/18/15   Elby Showers, MD  ONE [facility] ULTRA TEST test strip  12/08/15   Historical Provider, MD  [place]  12/08/15   Historical Provider, MD  potassium chloride SA (K-DUR,KLOR-CON) 20 MEQ tablet  Take 1 tablet by mouth two  times daily 04/24/15   Elby Showers, MD  torsemide (DEMADEX) 20 MG tablet TAKE ONE OR TWO tabs daily 07/08/16   Elby Showers, MD    ALLERGIES:  Allergies  Allergen Reactions  . Penicillins     REACTION: Skin irritation    SOCIAL HISTORY:  Social History  Substance Use Topics  . Smoking status: Never Smoker  . Smokeless tobacco: Never Used  . Alcohol use No    FAMILY HISTORY: Family History  Problem Relation Age of Onset  . Heart disease Mother   . Stroke Mother   . Hypertension Mother   . Heart disease Father   . Heart disease Brother   . Hypertension Son   . Colon  cancer Neg Hx     EXAM: BP (!) 176/92 (BP Location: Right Arm)   Pulse 86   Temp 98.4 F (36.9 C) (Oral)   Resp 18   Ht _0  (1.676 m)   Wt 170 lb (77.1 kg)   SpO2 99%   BMI 27.44 kg/m  CONSTITUTIONAL: Alert and oriented and responds appropriately to questions. Well-appearing; well-nourished HEAD: Normocephalic EYES: Conjunctivae clear, pupils appear equal, EOMI ENT: normal nose; moist mucous membranes. Dried blood in right nostril. Full sentences. No distress. Normal phonation. There is some blood in the posterior oropharynx. No hemorrhage. NECK: Supple, no meningismus, no nuchal rigidity, no LAD  CARD: RRR; S1 and S2 appreciated; no murmurs, no clicks, no rubs, no gallops RESP: Normal chest excursion without splinting or tachypnea; breath sounds clear and equal bilaterally; no wheezes, no rhonchi, no rales, no hypoxia or respiratory distress, speaking full sentences ABD/GI: Normal bowel sounds; non-distended; soft, non-tender, no rebound, no guarding, no peritoneal signs, no hepatosplenomegaly BACK:  The back appears normal and is non-tender to palpation, there is no CVA tenderness EXT: Normal ROM in all joints; non-tender to palpation; no edema; normal capillary refill; no cyanosis, no calf tenderness or swelling    SKIN: Normal color for age and race; warm; no rash NEURO: Moves all extremities equally PSYCH: The patient's mood and manner are appropriate. Grooming and personal hygiene are appropriate.  MEDICAL DECISION MAKING: Patient here with a nosebleed from the right nostril. It appears to be very mild in nature at this time. Hemodynamically stable. We have had her blow her nose and remove all clots and have sprayed a large amount of Afrin into the right nostril and will have her hold pressure for 30 minutes and reassess. She is mildly hypertensive but does appear to be anxious currently.  ED PROGRESS: 4:45 AM  Pt's nosebleed has stopped. No blood in the posterior oropharynx.  Blood pressure has improved without intervention. I feel she is safe to be discharged home and follow-up with ENT as needed. Disc discussed supportive care instructions and return precautions. Patient and family are comfortable with this plan.   At this time, I do not feel there is any life-threatening condition present. I have reviewed and discussed all results (EKG, imaging, lab, urine as appropriate) and exam findings with patient/family. I have reviewed nursing notes and appropriate previous records.  I feel the patient is safe to be discharged home without further emergent workup and can continue workup as an outpatient as needed. Discussed usual and customary return precautions. Patient/family verbalize understanding and are comfortable with this plan.  Outpatient follow-up has been provided if needed. All questions have been answered.    Procedures .Epistaxis Management Date/Time: 07/12/2016 4:08  AM Performed by: Nyra Jabs Authorized by: Nyra Jabs   Consent:    Consent obtained:  Verbal   Consent given by:  Patient   Risks discussed:  Bleeding, infection, nasal injury and pain   Alternatives discussed:  No treatment Anesthesia (see MAR for exact dosages):    Anesthesia method:  None Procedure details:    Treatment site:  R posterior   Treatment method:  Afrin and direct pressure for 30 minutes then 7.5 cm rhino rocket   Treatment complexity:  Complex   Treatment episode: recurrent  Post-procedure details:    Assessment:  Bleeding re-occurred after Afrin and pressure   Patient tolerance of procedure:  Tolerated well, no immediate complications; eventually stopped bleeding after nasal packing  I personally performed the services described in this documentation, which was scribed in my presence. The recorded information has been reviewed and is accurate.     [place], DO 07/12/16 0446     5:00 AM  Pt's nose began bleeding again after discharge when she was  in the parking lot. She was placed back in the room. I will place packing in her nose and observe.   5:30 AM  Pt unable to tolerate me placing a double balloon posterior nasal pack. I have placed a 7.5 cm posterior nasal packing but she still seems to have some blood in the posterior oropharynx. It has slowed down significantly but we will continue to monitor her. If unable to control bleeding here, will consult ENT.   6:15 AM  Pt does not appear to have any active drainage of blood in her posterior oropharynx. She is definitely is not spitting up clots of blood anymore. She reports she is feeling much better and she feels ready for discharge home. We'll leave 7.5 cm posterior packing in place and have her follow-up with ENT. She is allergic to penicillin which causes hives but can take cephalosporins. Will discharge on Keflex. Have discussed with her if she cannot get in to see ENT by the end of this week that she should remove her nasal packing. Have provided her with a syringe and discussed instructions on how to do this. Discussed return precautions with patient and husband. They're comfortable with this plan.   At this time, I do not feel there is any life-threatening condition present. I have reviewed and discussed all results (EKG, imaging, lab, urine as appropriate) and exam findings with patient/family. I have reviewed nursing notes and appropriate previous records.  I feel the patient is safe to be discharged home without further emergent workup and can continue workup as an outpatient as needed. Discussed usual and customary return precautions. Patient/family verbalize understanding and are comfortable with this plan.  Outpatient follow-up has been provided if needed. All questions have been answered.    [place], DO 07/12/16 [phone]

## 2016-07-12 NOTE — Discharge Instructions (Addendum)
Please leave the nasal packing in place until Friday or until instructed by ENT.  Please call ENT this AM to schedule appointment for follow-up. If ENT cannot see you by Friday afternoon, you may use a syringe to pull all of the air out of the balloon and slowly remove the nasal packing yourself. Please take your antibiotics until your nasal packing has been removed. If you began bleeding despite your nasal packing, please return to the hospital.    Once your nasal packing is out, you may use nasal saline to keep your mucous membranes moist. You may use a humidifier.  Other than nasal saline, please do not put anything into your nose for the next 3-4 days. Please do not blow your nose for the next 3-4 days. If your nose begins to bleed again, at this time it is okay to blow your nose and blow out all of the clots and then spray Afrin nasal spray into both nostrils and hold direct pressure for 30 minutes without stopping. If this does not stop the bleeding, please return to the hospital.

## 2016-07-12 NOTE — ED Notes (Signed)
Nose no longer bleeding. EDP aware.

## 2016-07-12 NOTE — ED Notes (Signed)
Still no bleeding, EDP aware and ready for DC.

## 2016-07-12 NOTE — ED Triage Notes (Signed)
Pt in POV reporting nosebleed off/on X24hrs. Pt called EMS yesterday morning approx 0730 and went to PCP to have procedure done to stop the bleeding. Started bleeding again last night around 0000. Active bleeding, denies thinners.

## 2016-07-12 NOTE — Telephone Encounter (Signed)
Patient called at 9:00 a.m. This morning stating that she had a nosebleed yesterday and her brother took her to the nearest Dr. To her house.  That doctor packed her nose.  Last night the nose started bleeding again and she went to the ED in the middle of the night.  She was discharged around 6:00 a.m. This morning.  She is now saying that the nose is bleeding through the packing that she was discharged home in.    ED disposition was that she see an ENT by the end of this week.  And, if the nose started bleeding again, to follow up at the ED if she couldn't get an appointment with an ENT.  Patient states that she is coughing up clots of blood.  She is not having shortness of breath, or any difficulty breathing.  She is not having pain of any fashion.  She is just having a nose bleed that is bleeding through the current packing in the nose.    Advised to apply pressure, lie down.  I will speak with Dr. Lenord FellersBaxley and call her back as soon as Dr. Lenord FellersBaxley arrives.  Meanwhile, I'll start trying to get an appointment [facility].    Spoke with Dr.Baxley; she advised to go ahead and proceed with ENT appointment.  Patient likely needs cauterization of the nose.  If patient doesn't want to wait for the appointment, she'll have to return to the ED.  Otherwise, she should see ENT per the ED disposition.    [facility] (Dr. Jearld FentonByers is doc of the day).  He'll see patient TODAY at 3:10.  Faxed documentation over to their office @ [phone]; Carleene Mainsattn Shayla (including demographics, insurance card, ED report and last CPE/Labs).  Called patient and advised her of appointment today, gave her phone #, address.  Patient verbalized understanding of these instructions and appointment information.  Also explained the clots that she's coughing up per Dr. Beryle QuantBaxley's instruction.  Patient verbalized understanding.

## 2016-07-12 NOTE — ED Notes (Signed)
Pt was walking to car and nose began bleeding again. Placed in same room, EDP aware. Pt instructed to blow nose and spray Afrin & hold pressure.

## 2016-07-12 NOTE — ED Notes (Signed)
EDP placed rhino rocket. Currently no bleeding, will continue monitor.

## 2016-08-23 ENCOUNTER — Other Ambulatory Visit: Payer: Medicare Other | Admitting: Internal Medicine

## 2016-08-23 DIAGNOSIS — R7302 Impaired glucose tolerance (oral): Secondary | ICD-10-CM | POA: Diagnosis not present

## 2016-08-23 DIAGNOSIS — I10 Essential (primary) hypertension: Secondary | ICD-10-CM | POA: Diagnosis not present

## 2016-08-23 LAB — BASIC METABOLIC PANEL
BUN: 25 mg/dL (ref 7–25)
CHLORIDE: 105 mmol/L (ref 98–110)
CO2: 30 mmol/L (ref 20–31)
Calcium: 9.6 mg/dL (ref 8.6–10.4)
Creat: 1.02 mg/dL — ABNORMAL HIGH (ref 0.50–0.99)
GLUCOSE: 106 mg/dL — AB (ref 65–99)
POTASSIUM: 4.3 mmol/L (ref 3.5–5.3)
Sodium: 144 mmol/L (ref 135–146)

## 2016-08-24 LAB — HEMOGLOBIN A1C
Hgb A1c MFr Bld: 5.3 % (ref <5.7)
Mean Plasma Glucose: 105 mg/dL

## 2016-08-24 LAB — MICROALBUMIN / CREATININE URINE RATIO
Creatinine, Urine: 214 mg/dL (ref 20–320)
MICROALB/CREAT RATIO: 3 ug/mg{creat} (ref <30)
Microalb, Ur: 0.6 mg/dL

## 2016-08-26 ENCOUNTER — Ambulatory Visit: Payer: Medicare Other | Admitting: Internal Medicine

## 2016-08-26 ENCOUNTER — Other Ambulatory Visit: Payer: Self-pay | Admitting: Internal Medicine

## 2016-08-29 ENCOUNTER — Encounter: Payer: Self-pay | Admitting: Internal Medicine

## 2016-08-29 ENCOUNTER — Ambulatory Visit (INDEPENDENT_AMBULATORY_CARE_PROVIDER_SITE_OTHER): Payer: Medicare Other | Admitting: Internal Medicine

## 2016-08-29 VITALS — BP 124/80 | HR 77 | Temp 98.0°F | Ht 66.0 in | Wt 170.0 lb

## 2016-08-29 DIAGNOSIS — R609 Edema, unspecified: Secondary | ICD-10-CM

## 2016-08-29 DIAGNOSIS — K219 Gastro-esophageal reflux disease without esophagitis: Secondary | ICD-10-CM

## 2016-08-29 DIAGNOSIS — Z8639 Personal history of other endocrine, nutritional and metabolic disease: Secondary | ICD-10-CM | POA: Diagnosis not present

## 2016-08-29 DIAGNOSIS — I10 Essential (primary) hypertension: Secondary | ICD-10-CM | POA: Diagnosis not present

## 2016-08-29 DIAGNOSIS — Z6827 Body mass index (BMI) 27.0-27.9, adult: Secondary | ICD-10-CM

## 2016-08-29 DIAGNOSIS — R7302 Impaired glucose tolerance (oral): Secondary | ICD-10-CM | POA: Diagnosis not present

## 2016-08-29 NOTE — Patient Instructions (Signed)
It was a pleasure to see you today. Continue diet and exercise efforts. I am very pleased with hemoglobin A1c and lipid panel. Return in 6 months for physical exam. Have annual diabetic eye exam in December 2018

## 2016-08-29 NOTE — Progress Notes (Signed)
   Subjective:    Patient ID: Kathleen Greer, female    DOB: 09/15/1945, 71 y.o.   MRN: [id number]  HPI 71 year old Black Female in today for six-month recheck on essential hypertension, impaired glucose tolerance, anxiety, hypokalemia secondary to diuretic therapy, dependent edema, GE reflux. She feels well and has no new complaints.  She still has great grandson living with her who is attending school in [place]. His mother has a history of drug abuse. Patient is retired. Does not smoke or consume alcohol.  Had diabetic eye exam done December 2017.  No new complaints. Labs reviewed with her. Fasting glucose is 106. Creatinine 1.02. Patient was fasting. Hemoglobin A1c improved from 5.9% 6 months ago to 5.3%. Urine for microalbumin normal.  In March, had an issue with epistaxis that required visit to ENT physician. It was right sided epistaxis. Patient does not know why it started. It never had this issue before.  Review of Systems see above-no new complaints     Objective:   Physical Exam Neck is supple without JVD thyromegaly or carotid bruits. Chest clear to auscultation. Cardiac exam regular rate and rhythm normal S1 and S2. Trace lower extremity edema that is nonpitting. Diabetic foot exam is normal.       Assessment & Plan:  Impaired glucose tolerance-excellent hemoglobin A1c  Obesity-continue diet and exercise efforts  Essential hypertension-stable on current regimen  Hyperlipidemia-stable  Anxiety-stable  GE reflux-stable  Epistaxis-one episode in March it was difficult to resolve  Plan: Continue diet exercise and weight loss efforts. Continue same medications and return in 6 months for physical exam.

## 2016-09-27 ENCOUNTER — Other Ambulatory Visit: Payer: Self-pay | Admitting: Internal Medicine

## 2016-10-28 ENCOUNTER — Other Ambulatory Visit: Payer: Self-pay | Admitting: Internal Medicine

## 2016-10-28 DIAGNOSIS — Z1231 Encounter for screening mammogram for malignant neoplasm of breast: Secondary | ICD-10-CM

## 2016-11-15 ENCOUNTER — Other Ambulatory Visit: Payer: Self-pay | Admitting: Internal Medicine

## 2016-11-17 ENCOUNTER — Ambulatory Visit
Admission: RE | Admit: 2016-11-17 | Discharge: 2016-11-17 | Disposition: A | Discharge status: Home / Self Care | Payer: Medicare Other | Source: Ambulatory Visit | Attending: Internal Medicine | Admitting: Internal Medicine

## 2016-11-17 DIAGNOSIS — Z1231 Encounter for screening mammogram for malignant neoplasm of breast: Secondary | ICD-10-CM | POA: Diagnosis not present

## 2017-01-03 DIAGNOSIS — E119 Type 2 diabetes mellitus without complications: Secondary | ICD-10-CM | POA: Diagnosis not present

## 2017-01-03 DIAGNOSIS — Z7984 Long term (current) use of oral hypoglycemic drugs: Secondary | ICD-10-CM | POA: Diagnosis not present

## 2017-01-03 DIAGNOSIS — H26492 Other secondary cataract, left eye: Secondary | ICD-10-CM | POA: Diagnosis not present

## 2017-01-24 ENCOUNTER — Other Ambulatory Visit: Payer: Self-pay | Admitting: Internal Medicine

## 2017-02-28 ENCOUNTER — Other Ambulatory Visit: Payer: Medicare Other | Admitting: Internal Medicine

## 2017-02-28 DIAGNOSIS — R7302 Impaired glucose tolerance (oral): Secondary | ICD-10-CM

## 2017-02-28 DIAGNOSIS — I10 Essential (primary) hypertension: Secondary | ICD-10-CM

## 2017-02-28 DIAGNOSIS — K219 Gastro-esophageal reflux disease without esophagitis: Secondary | ICD-10-CM | POA: Diagnosis not present

## 2017-02-28 DIAGNOSIS — Z0000 Encounter for general adult medical examination without abnormal findings: Secondary | ICD-10-CM

## 2017-02-28 DIAGNOSIS — F419 Anxiety disorder, unspecified: Secondary | ICD-10-CM

## 2017-02-28 DIAGNOSIS — E8881 Metabolic syndrome: Secondary | ICD-10-CM

## 2017-03-01 LAB — CBC WITH DIFFERENTIAL/PLATELET
BASOS PCT: 0.8 %
Basophils Absolute: 38 cells/uL (ref 0–200)
Eosinophils Absolute: 269 cells/uL (ref 15–500)
Eosinophils Relative: 5.6 %
HCT: 41.8 % (ref 35.0–45.0)
Hemoglobin: 14.5 g/dL (ref 11.7–15.5)
Lymphs Abs: 1762 cells/uL (ref 850–3900)
MCH: 27.5 pg (ref 27.0–33.0)
MCHC: 34.7 g/dL (ref 32.0–36.0)
MCV: 79.2 fL — ABNORMAL LOW (ref 80.0–100.0)
MONOS PCT: 6.6 %
MPV: 10.7 fL (ref 7.5–12.5)
Neutro Abs: 2414 cells/uL (ref 1500–7800)
Neutrophils Relative %: 50.3 %
PLATELETS: 347 10*3/uL (ref 140–400)
RBC: 5.28 10*6/uL — AB (ref 3.80–5.10)
RDW: 13.9 % (ref 11.0–15.0)
TOTAL LYMPHOCYTE: 36.7 %
WBC mixed population: 317 cells/uL (ref 200–950)
WBC: 4.8 10*3/uL (ref 3.8–10.8)

## 2017-03-01 LAB — COMPLETE METABOLIC PANEL WITH GFR
AG Ratio: 1.5 (calc) (ref 1.0–2.5)
ALT: 18 U/L (ref 6–29)
AST: 17 U/L (ref 10–35)
Albumin: 4.4 g/dL (ref 3.6–5.1)
Alkaline phosphatase (APISO): 84 U/L (ref 33–130)
BUN/Creatinine Ratio: 20 (calc) (ref 6–22)
BUN: 19 mg/dL (ref 7–25)
CALCIUM: 8.9 mg/dL (ref 8.6–10.4)
CHLORIDE: 102 mmol/L (ref 98–110)
CO2: 33 mmol/L — ABNORMAL HIGH (ref 20–32)
Creat: 0.94 mg/dL — ABNORMAL HIGH (ref 0.60–0.93)
GFR, EST NON AFRICAN AMERICAN: 61 mL/min/{1.73_m2} (ref >=60)
GFR, Est African American: 71 mL/min/{1.73_m2} (ref >=60)
GLUCOSE: 119 mg/dL — AB (ref 65–99)
Globulin: 2.9 g/dL (calc) (ref 1.9–3.7)
Potassium: 3.8 mmol/L (ref 3.5–5.3)
Sodium: 141 mmol/L (ref 135–146)
Total Bilirubin: 0.7 mg/dL (ref 0.2–1.2)
Total Protein: 7.3 g/dL (ref 6.1–8.1)

## 2017-03-01 LAB — TSH: TSH: 1.88 mIU/L (ref 0.40–4.50)

## 2017-03-01 LAB — HEMOGLOBIN A1C
Hgb A1c MFr Bld: 6.5 % of total Hgb — ABNORMAL HIGH (ref <5.7)
Mean Plasma Glucose: 140 (calc)
eAG (mmol/L): 7.7 (calc)

## 2017-03-01 LAB — MICROALBUMIN / CREATININE URINE RATIO
CREATININE, URINE: 198 mg/dL (ref 20–275)
MICROALB UR: 0.8 mg/dL
Microalb Creat Ratio: 4 mcg/mg creat (ref <30)

## 2017-03-01 LAB — LIPID PANEL
CHOLESTEROL: 183 mg/dL (ref <200)
HDL: 63 mg/dL (ref >50)
LDL CHOLESTEROL (CALC): 100 mg/dL — AB
Non-HDL Cholesterol (Calc): 120 mg/dL (calc) (ref <130)
TRIGLYCERIDES: 106 mg/dL (ref <150)
Total CHOL/HDL Ratio: 2.9 (calc) (ref <5.0)

## 2017-03-03 ENCOUNTER — Encounter: Payer: Self-pay | Admitting: Internal Medicine

## 2017-03-03 ENCOUNTER — Ambulatory Visit (INDEPENDENT_AMBULATORY_CARE_PROVIDER_SITE_OTHER): Payer: Medicare Other | Admitting: Internal Medicine

## 2017-03-03 VITALS — BP 138/82 | HR 68 | Temp 97.6°F | Ht 63.5 in | Wt 179.0 lb

## 2017-03-03 DIAGNOSIS — Z0000 Encounter for general adult medical examination without abnormal findings: Secondary | ICD-10-CM | POA: Diagnosis not present

## 2017-03-03 DIAGNOSIS — Z8639 Personal history of other endocrine, nutritional and metabolic disease: Secondary | ICD-10-CM | POA: Diagnosis not present

## 2017-03-03 DIAGNOSIS — I10 Essential (primary) hypertension: Secondary | ICD-10-CM

## 2017-03-03 DIAGNOSIS — Z86010 Personal history of colonic polyps: Secondary | ICD-10-CM

## 2017-03-03 DIAGNOSIS — E8881 Metabolic syndrome: Secondary | ICD-10-CM | POA: Diagnosis not present

## 2017-03-03 DIAGNOSIS — R609 Edema, unspecified: Secondary | ICD-10-CM | POA: Diagnosis not present

## 2017-03-03 DIAGNOSIS — Z23 Encounter for immunization: Secondary | ICD-10-CM | POA: Diagnosis not present

## 2017-03-03 DIAGNOSIS — R7302 Impaired glucose tolerance (oral): Secondary | ICD-10-CM | POA: Diagnosis not present

## 2017-03-03 LAB — POCT URINALYSIS DIPSTICK
BILIRUBIN UA: NEGATIVE
Blood, UA: NEGATIVE
GLUCOSE UA: NEGATIVE
Ketones, UA: NEGATIVE
LEUKOCYTES UA: NEGATIVE
NITRITE UA: NEGATIVE
PH UA: 6 (ref 5.0–8.0)
Protein, UA: NEGATIVE
Spec Grav, UA: 1.025 (ref 1.010–1.025)
UROBILINOGEN UA: 0.2 U/dL

## 2017-03-03 NOTE — Progress Notes (Signed)
Subjective:    Patient ID: Kathleen Greer, female    DOB: [birth year], 71 y.o.   MRN: [id number]  HPI 71 year old Female for health maintenance exam,Medicare wellness exam, and evaluation of medical issues.  History of GE reflux, controlled type 2 diabetes mellitus, hypokalemia, dependent edema.  History of scoliosis of the lumbar spine.  History of anxiety which is improved over the past few years.  Hypokalemia secondary to diuretic therapy.  History of benign positional vertigo.  History of colonoscopy 08/25/13 which was normal with 10-year follow-up recommended.  History of adenomatous colon polyps.  Hysterectomy without oophorectomy 1971.  Right rotator cuff surgery June 2018.  Right carpal tunnel release benign to down both 25-Aug-2001.  Penicillin causes a rash.   SHx: Raising greatgrandson 83 years old. His mother is in jail for drug charges.  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  She does not smoke or consume alcohol.  Family history: Adult son with hypertension.  Patient's father died with an MI.  Mother died with with an MI.  One brother died with heart failure.  Another brother died in 08-25-2004 with cirrhosis of the liver.  Sister died at age 61.  Another sister whose health is apparently okay  Review of Systems  Constitutional: Negative.   Respiratory: Negative.   Cardiovascular: Negative.   Gastrointestinal: Negative.   Genitourinary: Negative.   Psychiatric/Behavioral: Negative.   All other systems reviewed and are negative.      Objective:   Physical Exam  Constitutional: She is oriented to person, place, and time. She appears well-developed and well-nourished. No distress.  HENT:  Head: Normocephalic and atraumatic.  Right Ear: External ear normal.  Left Ear: External ear normal.  Mouth/Throat: Oropharynx is clear and moist.  Eyes: Conjunctivae and EOM are normal. Pupils are equal, round, and reactive to light. Right eye exhibits no  discharge. Left eye exhibits no discharge.  Neck: Neck supple. No JVD present. No thyromegaly present.  Cardiovascular: Normal rate, regular rhythm, normal heart sounds and intact distal pulses.  No murmur heard. Pulmonary/Chest: Effort normal and breath sounds normal. No respiratory distress. She has no wheezes. She has no rales.   Breasts normal female         Abdominal: Soft. Bowel sounds are normal. She exhibits no distension and no mass. There is no tenderness. There is no rebound and no guarding.  Genitourinary:  Genitourinary Comments: Bimanual exam normal.  Status post hysterectomy.  No need to do Pap smear due to hysterectomy and age  Musculoskeletal: She exhibits no edema.  Lymphadenopathy:    She has no cervical adenopathy.  Neurological: She is alert and oriented to person, place, and time. She has normal reflexes. No cranial nerve deficit.  Skin: Skin is warm and dry. She is not diaphoretic.  Psychiatric: She has a normal mood and affect. Her behavior is normal. Judgment and thought content normal.  Vitals reviewed.         Assessment & Plan:  Essential hypertension  Controlled type 2 diabetes mellitus-hemoglobin A1c 6.5% of previously 5.3% 6 months ago.  Work on diet and exercise  History of dependent edema  History of anxiety  GE reflux  Plan: All of these medical issues are under good control.  She will return in 1 year or as needed  Subjective:   Patient presents for Medicare Annual/Subsequent preventive examination.  Review Past Medical/Family/Social: See above   Risk Factors  Current exercise habits: Light  house and yard work Dietary issues discussed: Low-fat low-carb  Cardiac risk factors: Family history, diabetes mellitus  Depression Screen  (Note: if answer to either of the following is "Yes", a more complete depression screening is indicated)   Over the past two weeks, have you felt down, depressed or hopeless? No  Over the past two weeks,  have you felt little interest or pleasure in doing things? No Have you lost interest or pleasure in daily life? No Do you often feel hopeless? No Do you cry easily over simple problems? No   Activities of Daily Living  In your present state of health, do you have any difficulty performing the following activities?:   Driving? No  Managing money? No  Feeding yourself? No  Getting from bed to chair? No  Climbing a flight of stairs? No  Preparing food and eating?: No  Bathing or showering? No  Getting dressed: No  Getting to the toilet? No  Using the toilet:No  Moving around from place to place: No  In the past year have you fallen or had a near fall?:No  Are you sexually active? No  Do you have more than one partner? No   Hearing Difficulties: No  Do you often ask people to speak up or repeat themselves? No  Do you experience ringing or noises in your ears? No  Do you have difficulty understanding soft or whispered voices? No  Do you feel that you have a problem with memory? No Do you often misplace items? No    Home Safety:  Do you have a smoke alarm at your residence? Yes Do you have grab bars in the bathroom?  No Do you have throw rugs in your house?  Yes   Cognitive Testing  Alert? Yes Normal Appearance?Yes  Oriented to person? Yes Place? Yes  Time? Yes  Recall of three objects? Yes  Can perform simple calculations? Yes  Displays appropriate judgment?Yes  Can read the correct time from a watch face?Yes   List the Names of Other Physician/Practitioners you currently use:  See referral list for the physicians patient is currently seeing.     Review of Systems: See above   Objective:     General appearance: Appears stated age and mildly obese  Head: Normocephalic, without obvious abnormality, atraumatic  Eyes: conj clear, EOMi PEERLA  Ears: normal TM's and external ear canals both ears  Nose: Nares normal. Septum midline. Mucosa normal. No drainage or sinus  tenderness.  Throat: lips, mucosa, and tongue normal; teeth and gums normal  Neck: no adenopathy, no carotid bruit, no JVD, supple, symmetrical, trachea midline and thyroid not enlarged, symmetric, no tenderness/mass/nodules  No CVA tenderness.  Lungs: clear to auscultation bilaterally  Breasts: normal appearance, no masses or tenderness,  Heart: regular rate and rhythm, S1, S2 normal, no murmur, click, rub or gallop  Abdomen: soft, non-tender; bowel sounds normal; no masses, no organomegaly  Musculoskeletal: ROM normal in all joints, no crepitus, no deformity, Normal muscle strengthen. Back  is symmetric, no curvature. Skin: Skin color, texture, turgor normal. No rashes or lesions  Lymph nodes: Cervical, supraclavicular, and axillary nodes normal.  Neurologic: CN 2 -12 Normal, Normal symmetric reflexes. Normal coordination and gait  Psych: Alert & Oriented x 3, Mood appear stable.    Assessment:    Annual wellness medicare exam   Plan:    During the course of the visit the patient was educated and counseled about appropriate screening and preventive services including:  Annual flu vaccine     Patient Instructions (the written plan) was given to the patient.  Medicare Attestation  I have personally reviewed:  The patient's medical and social history  Their use of alcohol, tobacco or illicit drugs  Their current medications and supplements  The patient's functional ability including ADLs,fall risks, home safety risks, cognitive, and hearing and visual impairment  Diet and physical activities  Evidence for depression or mood disorders  The patient's weight, height, BMI, and visual acuity have been recorded in the chart. I have made referrals, counseling, and provided education to the patient based on review of the above and I have provided the patient with a written personalized care plan for preventive services.

## 2017-03-14 ENCOUNTER — Other Ambulatory Visit: Payer: Self-pay | Admitting: Internal Medicine

## 2017-03-16 ENCOUNTER — Other Ambulatory Visit: Payer: Self-pay

## 2017-03-16 NOTE — Patient Outreach (Signed)
[facility] Care Management  03/16/2017  Kathleen Greer 10/14/1945 [id number]   Medication Adherence call to [name] patient is showing past due under [facility]on Losartan 100 mg spoke with patient  ask if we can contact [facility]  And order all her medication from [facility] patient wiil received her medication in 3-4 days from mail order patient also had a question on why they were asking for a credit card when she order her medication .[facility] said all her medication are zero copay ,but is a company policy to have a credit card on file call [name] and explain that all her prescription are zero copay and that it is company policy to have a credit car on file.  Lillia AbedAna Ollison-Moran CPhT Pharmacy Technician [facility] Care Management Direct Dial [phone]  Fax [phone] [email or web address]

## 2017-03-17 DIAGNOSIS — R040 Epistaxis: Secondary | ICD-10-CM | POA: Diagnosis not present

## 2017-03-17 NOTE — Patient Instructions (Addendum)
It was pleasure to see you today.  Flu vaccine given.  Work on diet exercise and weight loss and return in 6 months.

## 2017-07-11 DIAGNOSIS — J0100 Acute maxillary sinusitis, unspecified: Secondary | ICD-10-CM | POA: Diagnosis not present

## 2017-07-11 DIAGNOSIS — J029 Acute pharyngitis, unspecified: Secondary | ICD-10-CM | POA: Diagnosis not present

## 2017-08-16 ENCOUNTER — Other Ambulatory Visit: Payer: Self-pay | Admitting: Internal Medicine

## 2017-08-16 DIAGNOSIS — R7302 Impaired glucose tolerance (oral): Secondary | ICD-10-CM

## 2017-08-16 DIAGNOSIS — E8881 Metabolic syndrome: Secondary | ICD-10-CM

## 2017-08-16 DIAGNOSIS — Z0000 Encounter for general adult medical examination without abnormal findings: Secondary | ICD-10-CM

## 2017-08-16 DIAGNOSIS — I10 Essential (primary) hypertension: Secondary | ICD-10-CM

## 2017-08-22 ENCOUNTER — Other Ambulatory Visit: Payer: Self-pay | Admitting: Internal Medicine

## 2017-08-29 ENCOUNTER — Other Ambulatory Visit: Payer: Medicare Other | Admitting: Internal Medicine

## 2017-08-29 DIAGNOSIS — I10 Essential (primary) hypertension: Secondary | ICD-10-CM | POA: Diagnosis not present

## 2017-08-29 DIAGNOSIS — R7302 Impaired glucose tolerance (oral): Secondary | ICD-10-CM

## 2017-08-29 DIAGNOSIS — E8881 Metabolic syndrome: Secondary | ICD-10-CM

## 2017-08-30 LAB — BASIC METABOLIC PANEL
BUN/Creatinine Ratio: 16 (calc) (ref 6–22)
BUN: 17 mg/dL (ref 7–25)
CALCIUM: 9.5 mg/dL (ref 8.6–10.4)
CO2: 31 mmol/L (ref 20–32)
Chloride: 102 mmol/L (ref 98–110)
Creat: 1.09 mg/dL — ABNORMAL HIGH (ref 0.60–0.93)
GLUCOSE: 112 mg/dL — AB (ref 65–99)
Potassium: 4.2 mmol/L (ref 3.5–5.3)
Sodium: 143 mmol/L (ref 135–146)

## 2017-08-30 LAB — HEMOGLOBIN A1C
HEMOGLOBIN A1C: 6.3 %{Hb} — AB (ref <5.7)
Mean Plasma Glucose: 134 (calc)
eAG (mmol/L): 7.4 (calc)

## 2017-08-30 LAB — MICROALBUMIN / CREATININE URINE RATIO
CREATININE, URINE: 272 mg/dL (ref 20–275)
Microalb Creat Ratio: 8 mcg/mg creat (ref <30)
Microalb, Ur: 2.3 mg/dL

## 2017-09-01 ENCOUNTER — Ambulatory Visit (INDEPENDENT_AMBULATORY_CARE_PROVIDER_SITE_OTHER): Payer: Medicare Other | Admitting: Internal Medicine

## 2017-09-01 ENCOUNTER — Encounter: Payer: Self-pay | Admitting: Internal Medicine

## 2017-09-01 VITALS — BP 160/90 | HR 82 | Ht 64.0 in | Wt 175.0 lb

## 2017-09-01 DIAGNOSIS — I10 Essential (primary) hypertension: Secondary | ICD-10-CM | POA: Diagnosis not present

## 2017-09-01 DIAGNOSIS — R7302 Impaired glucose tolerance (oral): Secondary | ICD-10-CM | POA: Diagnosis not present

## 2017-09-01 DIAGNOSIS — L659 Nonscarring hair loss, unspecified: Secondary | ICD-10-CM | POA: Diagnosis not present

## 2017-09-01 DIAGNOSIS — Z6830 Body mass index (BMI) 30.0-30.9, adult: Secondary | ICD-10-CM | POA: Diagnosis not present

## 2017-09-02 LAB — T4, FREE: FREE T4: 1.2 ng/dL (ref 0.8–1.8)

## 2017-09-02 LAB — IRON,TIBC AND FERRITIN PANEL
%SAT: 32 % (calc) (ref 11–50)
FERRITIN: 112 ng/mL (ref 20–288)
Iron: 101 ug/dL (ref 45–160)
TIBC: 319 mcg/dL (calc) (ref 250–450)

## 2017-09-02 LAB — TSH: TSH: 1.46 m[IU]/L (ref 0.40–4.50)

## 2017-09-15 ENCOUNTER — Encounter: Payer: Self-pay | Admitting: Internal Medicine

## 2017-09-15 ENCOUNTER — Ambulatory Visit (INDEPENDENT_AMBULATORY_CARE_PROVIDER_SITE_OTHER): Payer: Medicare Other | Admitting: Internal Medicine

## 2017-09-15 VITALS — BP 130/80 | HR 70 | Temp 97.9°F | Ht 64.0 in | Wt 172.0 lb

## 2017-09-15 DIAGNOSIS — M1711 Unilateral primary osteoarthritis, right knee: Secondary | ICD-10-CM | POA: Diagnosis not present

## 2017-09-15 DIAGNOSIS — R7302 Impaired glucose tolerance (oral): Secondary | ICD-10-CM

## 2017-09-15 DIAGNOSIS — L918 Other hypertrophic disorders of the skin: Secondary | ICD-10-CM | POA: Diagnosis not present

## 2017-09-15 DIAGNOSIS — I10 Essential (primary) hypertension: Secondary | ICD-10-CM | POA: Diagnosis not present

## 2017-09-15 NOTE — Patient Instructions (Signed)
For complaint of hair loss, TSH and iron binding capacity checked.  Follow-up May 31 with appointment for recheck of blood pressure.  Take blood pressure medication 2 hours before coming to the office.  Work on diet exercise and weight loss.  Hemoglobin A1c stable.  Watch sweets.

## 2017-09-15 NOTE — Progress Notes (Signed)
   Subjective:    Patient ID: Kathleen Greer, female    DOB: 09/19/1945, 72 y.o.   MRN: [id number]  HPI She is here today to follow-up on hypertension.  When she arrived her blood pressure was elevated at 140/80 but after resting quietly in the office for over 20 minutes blood pressure was rechecked by me and it was 130/80 left arm.  She is complaining of pain in her right knee.  Says she went to orthopedist a number of years ago and it seems [facility]  We will try to get her an appointment they are.  Seems to hurt when she is going up a steep incline.  She has a small skin tag right upper inner arm that can be removed in the future but we did not have time today.  This is a new issue.    Review of Systems see above-also tells me that she has needs to be done but it is very expensive.  Says she is going to consider [facility] to have it done.     Objective:   Physical Exam Blood pressure stable.  Has small skin tag right upper inner arm that is benign.  No swelling of right knee.       Assessment & Plan:  Right knee osteoarthritis  Essential hypertension-stable on current regimen.  Follow-up in 6 months  Benign skin tag right upper arm

## 2017-09-15 NOTE — Patient Instructions (Signed)
Continue on current antihypertensive medication.  Try to walk and lose some weight.  Referral made to [facility] orthopedics regarding right knee pain.

## 2017-09-15 NOTE — Progress Notes (Signed)
   Subjective:    Patient ID: Kathleen Greer, female    DOB: [birth year], 72 y.o.   MRN: [id number]  HPI She is here today for 54-month recheck.  Blood pressure is elevated at 160/90.  It was checked several times.  Hemoglobin A1c stable at 6.3%.  6 months ago it was 6.5%.  Basic metabolic panel is within normal limits.  Free T4 and TSH within normal limits.  Iron and iron binding capacity were checked because of MCV of 79.2 in November 2018 and complaint of hair loss.    Review of Systems see above     Objective:   Physical Exam Neck is supple without JVD thyromegaly or carotid bruits.  Chest clear to auscultation.  Cardiac exam regular rate and rhythm.  Extremities without pitting edema.       Assessment & Plan:  Persistent elevation of blood pressure today-?  Office hypertension.  Weight is increased 4 pounds since November 2018 and 8 pounds since November 2017.  She says she is active about her home and walks a lot.  She will return in a couple of weeks for repeat blood pressure check.  She is to take blood pressure medication at least 2 hours before coming to the office.  Complaint of hair loss.  TSH is within normal limits.  Iron and iron binding capacity were checked and proved to be normal as well.  Impaired glucose tolerance-stable  Plan: Return May 31 for follow-up

## 2017-12-05 ENCOUNTER — Other Ambulatory Visit: Payer: Self-pay | Admitting: Internal Medicine

## 2017-12-22 ENCOUNTER — Other Ambulatory Visit: Payer: Self-pay | Admitting: Internal Medicine

## 2017-12-22 DIAGNOSIS — Z1231 Encounter for screening mammogram for malignant neoplasm of breast: Secondary | ICD-10-CM

## 2017-12-26 ENCOUNTER — Ambulatory Visit
Admission: RE | Admit: 2017-12-26 | Discharge: 2017-12-26 | Disposition: A | Discharge status: Home / Self Care | Payer: Medicare Other | Source: Ambulatory Visit | Attending: Internal Medicine | Admitting: Internal Medicine

## 2017-12-26 DIAGNOSIS — Z1231 Encounter for screening mammogram for malignant neoplasm of breast: Secondary | ICD-10-CM | POA: Diagnosis not present

## 2018-01-10 ENCOUNTER — Telehealth: Payer: Self-pay

## 2018-01-10 NOTE — Telephone Encounter (Signed)
Left message to call back to schedule CPE. 

## 2018-02-19 ENCOUNTER — Other Ambulatory Visit: Payer: Self-pay

## 2018-02-19 MED ORDER — POTASSIUM CHLORIDE CRYS ER 20 MEQ PO TBCR: 20.0000 meq | EXTENDED_RELEASE_TABLET | Freq: Two times a day (BID) | ORAL | 3 refills | Status: DC

## 2018-02-19 MED ORDER — OMEPRAZOLE 20 MG PO CPDR: 20.0000 mg | DELAYED_RELEASE_CAPSULE | Freq: Every day | ORAL | 3 refills | Status: DC

## 2018-02-24 DIAGNOSIS — J209 Acute bronchitis, unspecified: Secondary | ICD-10-CM | POA: Diagnosis not present

## 2018-03-01 ENCOUNTER — Other Ambulatory Visit: Payer: Self-pay

## 2018-03-01 DIAGNOSIS — R7302 Impaired glucose tolerance (oral): Secondary | ICD-10-CM

## 2018-03-01 MED ORDER — METFORMIN HCL 1000 MG PO TABS: 1000.0000 mg | ORAL_TABLET | Freq: Every day | ORAL | 3 refills | Status: DC

## 2018-03-09 ENCOUNTER — Ambulatory Visit (INDEPENDENT_AMBULATORY_CARE_PROVIDER_SITE_OTHER): Payer: Medicare Other | Admitting: Internal Medicine

## 2018-03-09 DIAGNOSIS — Z23 Encounter for immunization: Secondary | ICD-10-CM | POA: Diagnosis not present

## 2018-03-09 NOTE — Progress Notes (Signed)
Flu vaccine given per CMA 

## 2018-03-09 NOTE — Patient Instructions (Signed)
Patient received a flu vaccine IM L deltoid, AV, CMA  

## 2018-04-19 ENCOUNTER — Other Ambulatory Visit: Payer: Medicare Other | Admitting: Internal Medicine

## 2018-04-19 DIAGNOSIS — E7800 Pure hypercholesterolemia, unspecified: Secondary | ICD-10-CM

## 2018-04-19 DIAGNOSIS — M1711 Unilateral primary osteoarthritis, right knee: Secondary | ICD-10-CM

## 2018-04-19 DIAGNOSIS — R7302 Impaired glucose tolerance (oral): Secondary | ICD-10-CM

## 2018-04-19 DIAGNOSIS — I10 Essential (primary) hypertension: Secondary | ICD-10-CM

## 2018-04-19 DIAGNOSIS — E8881 Metabolic syndrome: Secondary | ICD-10-CM

## 2018-04-19 DIAGNOSIS — Z0000 Encounter for general adult medical examination without abnormal findings: Secondary | ICD-10-CM

## 2018-04-20 ENCOUNTER — Ambulatory Visit (INDEPENDENT_AMBULATORY_CARE_PROVIDER_SITE_OTHER): Payer: Medicare Other | Admitting: Internal Medicine

## 2018-04-20 ENCOUNTER — Encounter: Payer: Self-pay | Admitting: Internal Medicine

## 2018-04-20 VITALS — BP 150/100 | HR 78 | Temp 98.2°F | Ht 64.0 in | Wt 173.0 lb

## 2018-04-20 DIAGNOSIS — I10 Essential (primary) hypertension: Secondary | ICD-10-CM | POA: Diagnosis not present

## 2018-04-20 DIAGNOSIS — K219 Gastro-esophageal reflux disease without esophagitis: Secondary | ICD-10-CM

## 2018-04-20 DIAGNOSIS — Z0000 Encounter for general adult medical examination without abnormal findings: Secondary | ICD-10-CM

## 2018-04-20 DIAGNOSIS — R7302 Impaired glucose tolerance (oral): Secondary | ICD-10-CM | POA: Diagnosis not present

## 2018-04-20 DIAGNOSIS — Z8639 Personal history of other endocrine, nutritional and metabolic disease: Secondary | ICD-10-CM

## 2018-04-20 DIAGNOSIS — M1711 Unilateral primary osteoarthritis, right knee: Secondary | ICD-10-CM

## 2018-04-20 DIAGNOSIS — E8881 Metabolic syndrome: Secondary | ICD-10-CM

## 2018-04-20 DIAGNOSIS — Z86010 Personal history of colonic polyps: Secondary | ICD-10-CM

## 2018-04-20 DIAGNOSIS — R609 Edema, unspecified: Secondary | ICD-10-CM

## 2018-04-20 LAB — POCT URINALYSIS DIPSTICK
APPEARANCE: NEGATIVE
Bilirubin, UA: NEGATIVE
Glucose, UA: NEGATIVE
Ketones, UA: NEGATIVE
LEUKOCYTES UA: NEGATIVE
Nitrite, UA: NEGATIVE
Odor: NEGATIVE
PH UA: 6 (ref 5.0–8.0)
Protein, UA: NEGATIVE
RBC UA: NEGATIVE
Spec Grav, UA: 1.015 (ref 1.010–1.025)
UROBILINOGEN UA: 0.2 U/dL

## 2018-04-20 LAB — CBC WITH DIFFERENTIAL/PLATELET
Absolute Monocytes: 294 cells/uL (ref 200–950)
BASOS PCT: 0.9 %
Basophils Absolute: 41 cells/uL (ref 0–200)
EOS ABS: 253 {cells}/uL (ref 15–500)
Eosinophils Relative: 5.5 %
HCT: 40.5 % (ref 35.0–45.0)
HEMOGLOBIN: 14 g/dL (ref 11.7–15.5)
Lymphs Abs: 1918 cells/uL (ref 850–3900)
MCH: 27.8 pg (ref 27.0–33.0)
MCHC: 34.6 g/dL (ref 32.0–36.0)
MCV: 80.4 fL (ref 80.0–100.0)
MONOS PCT: 6.4 %
MPV: 10.7 fL (ref 7.5–12.5)
NEUTROS ABS: 2093 {cells}/uL (ref 1500–7800)
Neutrophils Relative %: 45.5 %
Platelets: 351 10*3/uL (ref 140–400)
RBC: 5.04 10*6/uL (ref 3.80–5.10)
RDW: 14.1 % (ref 11.0–15.0)
Total Lymphocyte: 41.7 %
WBC: 4.6 10*3/uL (ref 3.8–10.8)

## 2018-04-20 LAB — COMPLETE METABOLIC PANEL WITH GFR
AG RATIO: 1.5 (calc) (ref 1.0–2.5)
ALT: 17 U/L (ref 6–29)
AST: 18 U/L (ref 10–35)
Albumin: 4.4 g/dL (ref 3.6–5.1)
Alkaline phosphatase (APISO): 97 U/L (ref 33–130)
BUN/Creatinine Ratio: 22 (calc) (ref 6–22)
BUN: 22 mg/dL (ref 7–25)
CALCIUM: 9.7 mg/dL (ref 8.6–10.4)
CO2: 32 mmol/L (ref 20–32)
Chloride: 103 mmol/L (ref 98–110)
Creat: 0.99 mg/dL — ABNORMAL HIGH (ref 0.60–0.93)
GFR, EST NON AFRICAN AMERICAN: 57 mL/min/{1.73_m2} — AB (ref >=60)
GFR, Est African American: 66 mL/min/{1.73_m2} (ref >=60)
GLUCOSE: 107 mg/dL — AB (ref 65–99)
Globulin: 2.9 g/dL (calc) (ref 1.9–3.7)
POTASSIUM: 4.1 mmol/L (ref 3.5–5.3)
Sodium: 142 mmol/L (ref 135–146)
Total Bilirubin: 1.1 mg/dL (ref 0.2–1.2)
Total Protein: 7.3 g/dL (ref 6.1–8.1)

## 2018-04-20 LAB — LIPID PANEL
CHOL/HDL RATIO: 3.1 (calc) (ref <5.0)
Cholesterol: 169 mg/dL (ref <200)
HDL: 54 mg/dL (ref >50)
LDL Cholesterol (Calc): 94 mg/dL (calc)
NON-HDL CHOLESTEROL (CALC): 115 mg/dL (ref <130)
Triglycerides: 113 mg/dL (ref <150)

## 2018-04-20 LAB — HEMOGLOBIN A1C
Hgb A1c MFr Bld: 6.2 % of total Hgb — ABNORMAL HIGH (ref <5.7)
Mean Plasma Glucose: 131 (calc)
eAG (mmol/L): 7.3 (calc)

## 2018-04-20 LAB — TSH: TSH: 1.8 mIU/L (ref 0.40–4.50)

## 2018-05-12 ENCOUNTER — Encounter: Payer: Self-pay | Admitting: Internal Medicine

## 2018-05-12 NOTE — Progress Notes (Signed)
Subjective:    Patient ID: Kathleen Greer, female    DOB: 07/30/1945, 73 y.o.   MRN: [id number]  HPI 73 year old Black Female in today for health maintenance exam, Medicare wellness exam and evaluation of medical issues.  History of GE reflux, controlled type 2 diabetes mellitus, hypokalemia, dependent edema.  History of scoliosis of the lumbar spine.  Issues with osteoarthritis of right knee  History of anxiety which has improved over the past few t years.  Hypokalemia secondary to diuretic therapy.  History of benign positional vertigo.  History of colonoscopy 2015 which was normal with 10-year follow-up recommended.  History of adenomatous colon polyps.  Hysterectomy without oophorectomy 1971.  Right rotator cuff surgery June 2018.  Right carpal tunnel release 2003 by Dr. Yisroel Rammingaldorf.  Penicillin causes a rash  Social history: She is raising her great grandson.  His mother is in jail with drug charges.  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  She also has taken in her teenage grandson from [place] but does not know if that will work out.  She does not smoke or consume alcohol.  Family history: Adult son with hypertension.  Patient's father died with an MI.  Mother died with an MI.  One brother died with heart failure.  Another brother died in 2006 with cirrhosis of the liver.  Sister died at age 72.  Another sister whose health is apparently okay.    Review of Systems  Constitutional: Negative.   Respiratory: Negative.   Cardiovascular: Negative.   Gastrointestinal: Negative.   Genitourinary: Negative.   Neurological: Negative.   Psychiatric/Behavioral: Negative.   All other systems reviewed and are negative.      Objective:   Physical Exam Vitals signs reviewed.  Constitutional:      General: She is not in acute distress.    Appearance: Normal appearance.  HENT:     Head: Normocephalic and atraumatic.     Right Ear: Tympanic membrane  normal.     Left Ear: Tympanic membrane normal.     Nose: Nose normal. No congestion or rhinorrhea.     Mouth/Throat:     Mouth: Mucous membranes are moist.     Pharynx: Oropharynx is clear. No oropharyngeal exudate.  Eyes:     General: No scleral icterus.       Right eye: No discharge.        Left eye: No discharge.     Extraocular Movements: Extraocular movements intact.     Conjunctiva/sclera: Conjunctivae normal.     Pupils: Pupils are equal, round, and reactive to light.  Neck:     Musculoskeletal: Neck supple. No neck rigidity.     Vascular: No carotid bruit.     Comments: No thyromegaly Cardiovascular:     Rate and Rhythm: Normal rate and regular rhythm.     Heart sounds: Normal heart sounds. No murmur. No gallop.      Comments: Breasts normal female without masses Pulmonary:     Effort: Pulmonary effort is normal. No respiratory distress.     Breath sounds: Normal breath sounds. No wheezing.  Abdominal:     General: There is no distension.     Palpations: Abdomen is soft. There is no mass.     Tenderness: There is no guarding.  Genitourinary:    Comments: Bimanual normal.  Status post hysterectomy.  No need to do Pap of vaginal cuff due to hysterectomy and age. Musculoskeletal:     Right  lower leg: No edema.     Left lower leg: No edema.  Lymphadenopathy:     Cervical: No cervical adenopathy.  Skin:    General: Skin is warm and dry.     Findings: No rash.  Neurological:     General: No focal deficit present.     Mental Status: She is alert and oriented to person, place, and time.     Cranial Nerves: No cranial nerve deficit.     Sensory: No sensory deficit.     Motor: No weakness.     Coordination: Coordination normal.  Psychiatric:        Mood and Affect: Mood normal.        Behavior: Behavior normal.        Thought Content: Thought content normal.        Judgment: Judgment normal.           Assessment & Plan:  Essential hypertension-stable on  current regimen  Controlled type 2 diabetes mellitus-hemoglobin A1c stable at 6.2% and treated with metformin  History of GE reflux treated with omeprazole  Dependent edema treated with Demadex  History of diuretic induced hypokalemia treated with potassium supplement  Plan: Return in 6 months or as needed.  Continue same medications.  Had flu vaccine in November.  Tetanus immunization is up-to-date.  Subjective:   Patient presents for Medicare Annual/Subsequent preventive examination.  Review Past Medical/Family/Social: See above   Risk Factors  Current exercise habits: Housework and yard work Dietary issues discussed: Low-fat low carbohydrate  Cardiac risk factors: Diabetes mellitus and family history in parents both of whom died of an MI.  Brother died with heart failure.  Depression Screen  (Note: if answer to either of the following is "Yes", a more complete depression screening is indicated)   Over the past two weeks, have you felt down, depressed or hopeless? No  Over the past two weeks, have you felt little interest or pleasure in doing things? No Have you lost interest or pleasure in daily life? No Do you often feel hopeless? No Do you cry easily over simple problems? No   Activities of Daily Living  In your present state of health, do you have any difficulty performing the following activities?:   Driving?  Cannot drive at night due to eye issues Managing money? No  Feeding yourself? No  Getting from bed to chair? No  Climbing a flight of stairs? No  Preparing food and eating?: No  Bathing or showering? No  Getting dressed: No  Getting to the toilet? No  Using the toilet:No  Moving around from place to place: No  In the past year have you fallen or had a near fall?:No  Are you sexually active?  Yes Do you have more than one partner? No   Hearing Difficulties: No  Do you often ask people to speak up or repeat themselves?  Yes Do you experience ringing or  noises in your ears? No  Do you have difficulty understanding soft or whispered voices?  Yes Do you feel that you have a problem with memory?  At times Do you often misplace items?  At times   Home Safety:  Do you have a smoke alarm at your residence? Yes Do you have grab bars in the bathroom?  Yes Do you have throw rugs in your house?  Yes   Cognitive Testing  Alert? Yes Normal Appearance?Yes  Oriented to person? Yes Place? Yes  Time? Yes  Recall of  three objects? Yes  Can perform simple calculations? Yes  Displays appropriate judgment?Yes  Can read the correct time from a watch face?Yes   List the Names of Other Physician/Practitioners you currently use:  See referral list for the physicians patient is currently seeing.     Review of Systems: See above   Objective:     General appearance: Appears stated age and mildly obese  Head: Normocephalic, without obvious abnormality, atraumatic  Eyes: conj clear, EOMi PEERLA  Ears: normal TM's and external ear canals both ears  Nose: Nares normal. Septum midline. Mucosa normal. No drainage or sinus tenderness.  Throat: lips, mucosa, and tongue normal; teeth and gums normal  Neck: no adenopathy, no carotid bruit, no JVD, supple, symmetrical, trachea midline and thyroid not enlarged, symmetric, no tenderness/mass/nodules  No CVA tenderness.  Lungs: clear to auscultation bilaterally  Breasts: normal appearance, no masses or tenderness Heart: regular rate and rhythm, S1, S2 normal, no murmur, click, rub or gallop  Abdomen: soft, non-tender; bowel sounds normal; no masses, no organomegaly  Musculoskeletal: ROM normal in all joints, no crepitus, no deformity, Normal muscle strengthen. Back  is symmetric, no curvature. Skin: Skin color, texture, turgor normal. No rashes or lesions  Lymph nodes: Cervical, supraclavicular, and axillary nodes normal.  Neurologic: CN 2 -12 Normal, Normal symmetric reflexes. Normal coordination and gait    Psych: Alert & Oriented x 3, Mood appear stable.    Assessment:    Annual wellness medicare exam   Plan:    During the course of the visit the patient was educated and counseled about appropriate screening and preventive services including:   Annual flu vaccine  Pneumococcal vaccines up-to-date     Patient Instructions (the written plan) was given to the patient.  Medicare Attestation  I have personally reviewed:  The patient's medical and social history  Their use of alcohol, tobacco or illicit drugs  Their current medications and supplements  The patient's functional ability including ADLs,fall risks, home safety risks, cognitive, and hearing and visual impairment  Diet and physical activities  Evidence for depression or mood disorders  The patient's weight, height, BMI, and visual acuity have been recorded in the chart. I have made referrals, counseling, and provided education to the patient based on review of the above and I have provided the patient with a written personalized care plan for preventive services.

## 2018-05-12 NOTE — Patient Instructions (Signed)
It was a pleasure to see you today.  Continue same medications and follow-up in 6 months.  Lab work is within normal limits.

## 2018-10-15 ENCOUNTER — Other Ambulatory Visit: Payer: Self-pay

## 2018-10-15 ENCOUNTER — Ambulatory Visit (INDEPENDENT_AMBULATORY_CARE_PROVIDER_SITE_OTHER): Payer: Medicare Other | Admitting: Internal Medicine

## 2018-10-15 ENCOUNTER — Telehealth: Payer: Self-pay | Admitting: Internal Medicine

## 2018-10-15 ENCOUNTER — Encounter: Payer: Self-pay | Admitting: Internal Medicine

## 2018-10-15 ENCOUNTER — Ambulatory Visit
Admission: RE | Admit: 2018-10-15 | Discharge: 2018-10-15 | Disposition: A | Discharge status: Home / Self Care | Payer: Medicare Other | Source: Ambulatory Visit | Attending: Internal Medicine | Admitting: Internal Medicine

## 2018-10-15 VITALS — BP 140/80 | HR 78 | Temp 97.8°F | Wt 174.0 lb

## 2018-10-15 DIAGNOSIS — M79671 Pain in right foot: Secondary | ICD-10-CM

## 2018-10-15 DIAGNOSIS — M79675 Pain in left toe(s): Secondary | ICD-10-CM

## 2018-10-15 DIAGNOSIS — M19072 Primary osteoarthritis, left ankle and foot: Secondary | ICD-10-CM | POA: Diagnosis not present

## 2018-10-15 NOTE — Patient Instructions (Signed)
Ace wrap to foot. Apply ice to foot. 2 Advil twice a day and keep foot elevated. Labs and Xray report pending.

## 2018-10-15 NOTE — Telephone Encounter (Addendum)
Kathleen Greer [id number]  [name] called to say that her left big toe hurts, she original thought it was an ingrown toenail, but it hurts differently than any she has every had it hurts on the bottom of her toe, burning sensation, very painful, she has tried soaking it. Nothing is helping.

## 2018-10-15 NOTE — Telephone Encounter (Signed)
It may be gout. Schedule OV

## 2018-10-15 NOTE — Progress Notes (Signed)
   Subjective:    Patient ID: Kathleen Greer, female    DOB: 08-17-1945, 73 y.o.   MRN: [id number]  HPI 73 year old Female with pain dorsum of foot. Also complaining of paresthesias around plantar aspect at first and second MTP joints. No known injury. No history of gout. Foot is slightly swollen.    Review of Systems no prior history of gout or foot issues.     Objective:   Physical Exam Slight nonpitting edema of foot. Foot is not red or warm to touch. Has some point tenderness over ball of foot. CBC with diff, sed rate, and uric acid drawn. Has not been doing excessive walking or exercising/weight bearing. dorsalis pedis pulse intact. No metatarsal point tenderness. No apparent insect bite or wound. Did trim great toenail close but no drainage or redness.       Assessment & Plan:  ? Neuroma dorsum of foot vs musculoskeletal pain  Plan: Xray of foot. Advil samples 2 po bid Ace wrap to foot and ankle. Apply ice to foot and avoid prolonged standing.  8pm: Xray has not been read yet.

## 2018-10-15 NOTE — Telephone Encounter (Signed)
Scheduled OV.

## 2018-10-16 LAB — CBC WITH DIFFERENTIAL/PLATELET
Absolute Monocytes: 442 cells/uL (ref 200–950)
Basophils Absolute: 51 cells/uL (ref 0–200)
Basophils Relative: 0.8 %
Eosinophils Absolute: 422 cells/uL (ref 15–500)
Eosinophils Relative: 6.6 %
HCT: 40.2 % (ref 35.0–45.0)
Hemoglobin: 13.9 g/dL (ref 11.7–15.5)
Lymphs Abs: 2688 cells/uL (ref 850–3900)
MCH: 28 pg (ref 27.0–33.0)
MCHC: 34.6 g/dL (ref 32.0–36.0)
MCV: 81 fL (ref 80.0–100.0)
MPV: 10.8 fL (ref 7.5–12.5)
Monocytes Relative: 6.9 %
Neutro Abs: 2797 cells/uL (ref 1500–7800)
Neutrophils Relative %: 43.7 %
Platelets: 327 10*3/uL (ref 140–400)
RBC: 4.96 10*6/uL (ref 3.80–5.10)
RDW: 14.4 % (ref 11.0–15.0)
Total Lymphocyte: 42 %
WBC: 6.4 10*3/uL (ref 3.8–10.8)

## 2018-10-16 LAB — URIC ACID: Uric Acid, Serum: 7 mg/dL (ref 2.5–7.0)

## 2018-10-16 LAB — SEDIMENTATION RATE: Sed Rate: 9 mm/h (ref 0–30)

## 2018-10-22 ENCOUNTER — Telehealth: Payer: Self-pay | Admitting: Internal Medicine

## 2018-10-22 NOTE — Telephone Encounter (Signed)
Kathleen Greer called to say she is still having trouble with her toe, she has done everything you told her to do but it still is giving her a fit.

## 2018-10-22 NOTE — Telephone Encounter (Signed)
Called and gave Kathleen Greer the phone number for [facility]

## 2018-10-22 NOTE — Telephone Encounter (Signed)
error 

## 2018-10-22 NOTE — Telephone Encounter (Signed)
Please ask her to call [facility] for an appt(Podiatrist)

## 2018-10-30 ENCOUNTER — Encounter: Payer: Self-pay | Admitting: Sports Medicine

## 2018-10-30 ENCOUNTER — Other Ambulatory Visit: Payer: Self-pay

## 2018-10-30 ENCOUNTER — Ambulatory Visit (INDEPENDENT_AMBULATORY_CARE_PROVIDER_SITE_OTHER): Payer: Medicare Other

## 2018-10-30 ENCOUNTER — Ambulatory Visit: Payer: Medicare Other | Admitting: Sports Medicine

## 2018-10-30 ENCOUNTER — Other Ambulatory Visit: Payer: Self-pay | Admitting: Sports Medicine

## 2018-10-30 ENCOUNTER — Other Ambulatory Visit: Payer: Self-pay | Admitting: Podiatry

## 2018-10-30 VITALS — BP 164/88 | HR 83 | Temp 98.3°F | Resp 16

## 2018-10-30 DIAGNOSIS — M792 Neuralgia and neuritis, unspecified: Secondary | ICD-10-CM

## 2018-10-30 DIAGNOSIS — M19072 Primary osteoarthritis, left ankle and foot: Secondary | ICD-10-CM

## 2018-10-30 DIAGNOSIS — M1990 Unspecified osteoarthritis, unspecified site: Secondary | ICD-10-CM

## 2018-10-30 DIAGNOSIS — M79672 Pain in left foot: Secondary | ICD-10-CM

## 2018-10-30 NOTE — Progress Notes (Signed)
Subjective: Kathleen Greer is a 73 y.o. female patient who presents to office for evaluation of left foot pain. Patient complains of progressive pain consisting of numbness to the left first and second toes and feels numb also at the bottom underneath her first toe states that her foot occasionally has swelling but what is most concerning is the numbness and shooting pain and cramping reports that she has tried icing soaking with Epson salt without any improvement and reports that this has been going on for the last 3 weeks also admits to a history of significant orthopedic/knee problems on the left and is wondering if this could be adding to the pain that she is having in the form of numbness to her left foot.  Patient reports that she is considered diabetic and is on metformin but does not check her blood sugars.  Review of Systems  Musculoskeletal: Positive for joint pain.  Neurological: Positive for sensory change.  All other systems reviewed and are negative.     Patient Active Problem List   Diagnosis Date Noted  . Osteoarthritis of right knee 09/15/2017  . Epistaxis 07/12/2016  . Impaired glucose tolerance 09/04/2015  . Metabolic syndrome 16/01/9603  . Anxiety 06/02/2011  . Hypertension 01/12/2011  . GE reflux 01/12/2011  . Dependent edema 01/12/2011  . Benign positional vertigo 01/12/2011  . Hx of adenomatous colonic polyps 01/12/2011    Current Outpatient Medications on File Prior to Visit  Medication Sig Dispense Refill  . amLODipine (NORVASC) 10 MG tablet TAKE 1 TABLET BY MOUTH  DAILY 90 tablet 3  . aspirin 81 MG tablet Take 81 mg by mouth daily.      . Blood Glucose Monitoring Suppl (ONE TOUCH ULTRA 2) w/Device KIT     . Cholecalciferol (VITAMIN D) 1000 UNITS capsule Take 2,000 Units by mouth daily.     Marland Kitchen losartan (COZAAR) 100 MG tablet TAKE 1 TABLET BY MOUTH  DAILY 90 tablet 3  . metFORMIN (GLUCOPHAGE) 1000 MG tablet Take 1 tablet (1,000 mg total) by mouth daily with  breakfast. 90 tablet 3  . omeprazole (PRILOSEC) 20 MG capsule Take 1 capsule (20 mg total) by mouth daily. 90 capsule 3  . ONE TOUCH ULTRA TEST test strip     . ONETOUCH DELICA LANCETS FINE MISC     . potassium chloride SA (K-DUR,KLOR-CON) 20 MEQ tablet Take 1 tablet (20 mEq total) by mouth 2 (two) times daily. 180 tablet 3  . torsemide (DEMADEX) 20 MG tablet TAKE ONE OR TWO tabs daily 180 tablet 3  . [DISCONTINUED] potassium chloride (KLOR-CON) 20 MEQ packet Take 20 mEq by mouth daily. 30 packet 11   No current facility-administered medications on file prior to visit.     Allergies  Allergen Reactions  . Penicillin G Other (See Comments)    Other  . Penicillins     REACTION: Skin irritation    Objective:  General: Alert and oriented x3 in no acute distress  Dermatology: No open lesions bilateral lower extremities, no webspace macerations, no ecchymosis bilateral, all nails x 10 are well manicured.  Vascular: Dorsalis Pedis and Posterior Tibial pedal pulses palpable, Capillary Fill Time 3 seconds,(+) pedal hair growth bilateral, no edema bilateral lower extremities, Temperature gradient within normal limits.  Neurology: Gross sensation intact via light touch bilateral.  Subjective numbness however protective and vibratory sensation intact on left.  Musculoskeletal: Minimal tenderness to palpation to left foot no reproducible pain however there is significant numbness.  Mild limited first  metatarsophalangeal joint range of motion on left.. Strength within normal limits in all groups bilateral.   Gait: Non-antalgic gait  Xrays  Left foot   Impression: Normal osseous mineralization.  There is mild joint space narrowing at the first metatarsal phalangeal joint and midfoot consistent with arthritis.  No other acute findings.  Assessment and Plan: Problem List Items Addressed This Visit    None    Visit Diagnoses    Left foot pain    -  Primary   Neuritis       Arthritis of foot,  left           -Complete examination performed -Xrays reviewed -Discussed treatement options for neuritis possible secondary mechanical versus underlying arthritis versus neuroma -Recommend topical pain cream and rub if no improvement after 1 month advised patient to consider steroid injection versus nerve studies -Patient to return to office as needed or sooner if condition worsens.  Landis Martins, DPM

## 2018-11-16 ENCOUNTER — Other Ambulatory Visit: Payer: Self-pay | Admitting: Internal Medicine

## 2018-11-16 DIAGNOSIS — Z1231 Encounter for screening mammogram for malignant neoplasm of breast: Secondary | ICD-10-CM

## 2018-12-26 ENCOUNTER — Encounter: Payer: Self-pay | Admitting: Sports Medicine

## 2018-12-26 ENCOUNTER — Telehealth: Payer: Self-pay | Admitting: *Deleted

## 2018-12-26 ENCOUNTER — Ambulatory Visit (INDEPENDENT_AMBULATORY_CARE_PROVIDER_SITE_OTHER): Payer: Medicare Other | Admitting: Sports Medicine

## 2018-12-26 ENCOUNTER — Other Ambulatory Visit: Payer: Self-pay

## 2018-12-26 DIAGNOSIS — M79672 Pain in left foot: Secondary | ICD-10-CM | POA: Diagnosis not present

## 2018-12-26 DIAGNOSIS — M779 Enthesopathy, unspecified: Secondary | ICD-10-CM | POA: Diagnosis not present

## 2018-12-26 DIAGNOSIS — E8881 Metabolic syndrome: Secondary | ICD-10-CM

## 2018-12-26 DIAGNOSIS — M792 Neuralgia and neuritis, unspecified: Secondary | ICD-10-CM

## 2018-12-26 DIAGNOSIS — M19072 Primary osteoarthritis, left ankle and foot: Secondary | ICD-10-CM | POA: Diagnosis not present

## 2018-12-26 MED ORDER — TRIAMCINOLONE ACETONIDE 10 MG/ML IJ SUSP
10.0000 mg | Freq: Once | INTRAMUSCULAR | Status: AC
  Administered 2018-12-26: 10 mg

## 2018-12-26 NOTE — Telephone Encounter (Signed)
Faxed orders, clinicals and demographics to [facility]. 

## 2018-12-26 NOTE — Progress Notes (Signed)
Subjective: Kathleen Greer is a 73 y.o. female patient who returns to office for follow up evaluation of left foot pain. Patient reports continued pain at 1st toe and numbness to her left foot.  Reports that the numbness feels like it is getting worse and it feels weird and still swollen from time to time reports that she has more movement but has pain 8 out of 10 topical pain cream did not help.  Patient reports that she is considered diabetic and is on metformin but does not check her blood sugars like before.  Denies any other changes with medical history since last encounter.    Patient Active Problem List   Diagnosis Date Noted  . Osteoarthritis of right knee 09/15/2017  . Epistaxis 07/12/2016  . Impaired glucose tolerance 09/04/2015  . Metabolic syndrome 41/28/7867  . Anxiety 06/02/2011  . Hypertension 01/12/2011  . GE reflux 01/12/2011  . Dependent edema 01/12/2011  . Benign positional vertigo 01/12/2011  . Hx of adenomatous colonic polyps 01/12/2011    Current Outpatient Medications on File Prior to Visit  Medication Sig Dispense Refill  . amLODipine (NORVASC) 10 MG tablet TAKE 1 TABLET BY MOUTH  DAILY 90 tablet 3  . aspirin 81 MG tablet Take 81 mg by mouth daily.      . Blood Glucose Monitoring Suppl (ONE TOUCH ULTRA 2) w/Device KIT     . Cholecalciferol (VITAMIN D) 1000 UNITS capsule Take 2,000 Units by mouth daily.     Marland Kitchen losartan (COZAAR) 100 MG tablet TAKE 1 TABLET BY MOUTH  DAILY 90 tablet 3  . metFORMIN (GLUCOPHAGE) 1000 MG tablet Take 1 tablet (1,000 mg total) by mouth daily with breakfast. 90 tablet 3  . omeprazole (PRILOSEC) 20 MG capsule Take 1 capsule (20 mg total) by mouth daily. 90 capsule 3  . ONE TOUCH ULTRA TEST test strip     . ONETOUCH DELICA LANCETS FINE MISC     . potassium chloride SA (K-DUR,KLOR-CON) 20 MEQ tablet Take 1 tablet (20 mEq total) by mouth 2 (two) times daily. 180 tablet 3  . torsemide (DEMADEX) 20 MG tablet TAKE ONE OR TWO tabs daily 180 tablet  3  . [DISCONTINUED] potassium chloride (KLOR-CON) 20 MEQ packet Take 20 mEq by mouth daily. 30 packet 11   No current facility-administered medications on file prior to visit.     Allergies  Allergen Reactions  . Penicillin G Other (See Comments)    Other  . Penicillins     REACTION: Skin irritation    Objective:  General: Alert and oriented x3 in no acute distress  Dermatology: No open lesions bilateral lower extremities, no webspace macerations, no ecchymosis bilateral, all nails x 10 are well manicured.  Vascular: Dorsalis Pedis and Posterior Tibial pedal pulses palpable, Capillary Fill Time 3 seconds,(+) pedal hair growth bilateral, trace edema bilateral lower extremities, Temperature gradient within normal limits.  Neurology: Gross sensation intact via light touch bilateral.  Subjective numbness however protective and vibratory sensation intact on left.  Musculoskeletal: Minimal tenderness to palpation to left foot at hallux IPJ left with subjective stiffness and numbness.  Mild limited first metatarsophalangeal joint range of motion on lef.. Strength within normal limits in all groups bilateral.    Assessment and Plan: Problem List Items Addressed This Visit      Other   Metabolic syndrome    Other Visit Diagnoses    Arthritis of foot, left    -  Primary   Relevant Medications   triamcinolone  acetonide (KENALOG) 10 MG/ML injection 10 mg (Start on 12/26/2018 12:45 PM)   Capsulitis       Relevant Medications   triamcinolone acetonide (KENALOG) 10 MG/ML injection 10 mg (Start on 12/26/2018 12:45 PM)   Neuritis       Relevant Medications   triamcinolone acetonide (KENALOG) 10 MG/ML injection 10 mg (Start on 12/26/2018 12:45 PM)   Left foot pain       Relevant Medications   triamcinolone acetonide (KENALOG) 10 MG/ML injection 10 mg (Start on 12/26/2018 12:45 PM)     -Complete examination performed -Previous xrays reviewed -Re-Discussed treatement options for neuritis  possible secondary mechanical versus underlying arthritis and diabetes -After oral consent and aseptic prep, injected a mixture containing 1 ml of 2% plain lidocaine, 1 ml 0.5% plain marcaine, 0.5 ml of kenalog 10 and 0.5 ml of dexamethasone phosphate into left hallux IPJ without complication. Post-injection care discussed with patient. -Ordered nerve conduction test and advised patient that likely this can be from her diabetic neuropathy but want to rule out any other possible causes of the abrupt nerve pain -Patient to return to office after nerve studies or sooner if condition worsens.  Landis Martins, DPM

## 2018-12-26 NOTE — Telephone Encounter (Signed)
-----   Message from [place], [place] sent at 12/26/2018 12:36 PM EDT ----- Regarding: Nerve conduction testing Tingling burning greater on the left versus right foot with history of diabetes evaluate for large fiber neuropathy.  Patient is willing to travel to [facility] for nerve studies.

## 2019-01-01 ENCOUNTER — Other Ambulatory Visit: Payer: Self-pay

## 2019-01-01 ENCOUNTER — Ambulatory Visit
Admission: RE | Admit: 2019-01-01 | Discharge: 2019-01-01 | Disposition: A | Discharge status: Home / Self Care | Payer: Medicare Other | Source: Ambulatory Visit | Attending: Internal Medicine | Admitting: Internal Medicine

## 2019-01-01 DIAGNOSIS — Z1231 Encounter for screening mammogram for malignant neoplasm of breast: Secondary | ICD-10-CM

## 2019-01-03 ENCOUNTER — Other Ambulatory Visit: Payer: Self-pay

## 2019-01-03 ENCOUNTER — Encounter: Payer: Self-pay | Admitting: Neurology

## 2019-01-03 DIAGNOSIS — G629 Polyneuropathy, unspecified: Secondary | ICD-10-CM

## 2019-01-24 ENCOUNTER — Other Ambulatory Visit: Payer: Self-pay | Admitting: Internal Medicine

## 2019-01-24 DIAGNOSIS — R7302 Impaired glucose tolerance (oral): Secondary | ICD-10-CM

## 2019-01-24 NOTE — Telephone Encounter (Signed)
CPE due January. Please book before refilling

## 2019-01-25 ENCOUNTER — Other Ambulatory Visit: Payer: Self-pay

## 2019-01-25 MED ORDER — LOSARTAN POTASSIUM 100 MG PO TABS: 100.0000 mg | ORAL_TABLET | Freq: Every day | ORAL | 0 refills | Status: DC

## 2019-01-25 MED ORDER — AMLODIPINE BESYLATE 10 MG PO TABS: 10.0000 mg | ORAL_TABLET | Freq: Every day | ORAL | 0 refills | Status: DC

## 2019-02-12 ENCOUNTER — Ambulatory Visit (INDEPENDENT_AMBULATORY_CARE_PROVIDER_SITE_OTHER): Payer: Medicare Other | Admitting: Neurology

## 2019-02-12 ENCOUNTER — Other Ambulatory Visit: Payer: Self-pay

## 2019-02-12 DIAGNOSIS — M792 Neuralgia and neuritis, unspecified: Secondary | ICD-10-CM

## 2019-02-12 DIAGNOSIS — R202 Paresthesia of skin: Secondary | ICD-10-CM

## 2019-02-12 NOTE — Procedures (Signed)
[facility]  [address]  [place], [place] [address] Tel: [phone] Fax:  [phone] Test Date:  02/12/2019  Patient: Kathleen Greer DOB: [birth year] Physician: Narda Amber, DO  Sex: Female Height: 5\' 4"  Ref Phys: Landis Martins, DPM  ID#: [id number] Temp: 32.0C Technician:    Patient Complaints: This is a 73 year old female with diabetes mellitus referred for evaluation of left toe numbness.  NCV & EMG Findings: Electrodiagnostic testing of the right lower extremity and additional studies of the left shows: 1. Bilateral sural and superficial peroneal sensory responses are within normal limits. 2. Bilateral peroneal and tibial motor responses are within normal limits. 3. Bilateral tibial H reflex studies are within normal limits. 4. There is no evidence of active or chronic motor axonal changes affecting any of the tested muscles.  Motor unit configuration and recruitment pattern is within normal limits.   Impression: This is a normal study of the lower extremities.  In particular, there is no evidence of a large fiber sensorimotor polyneuropathy or lumbosacral radiculopathy.   ___________________________ Narda Amber, DO    Nerve Conduction Studies Anti Sensory Summary Table   Site NR Peak (ms) Norm Peak (ms) P-T Amp (V) Norm P-T Amp  Left Sup Peroneal Anti Sensory (Ant Lat Mall)  32C  12 cm    2.9 <4.6 7.6 >3  Right Sup Peroneal Anti Sensory (Ant Lat Mall)  32C  12 cm    2.7 <4.6 6.5 >3  Left Sural Anti Sensory (Lat Mall)  32C  Calf    2.5 <4.6 14.5 >3  Right Sural Anti Sensory (Lat Mall)  32C  Calf    3.1 <4.6 12.4 >3   Motor Summary Table   Site NR Onset (ms) Norm Onset (ms) O-P Amp (mV) Norm O-P Amp Site1 Site2 Delta-0 (ms) Dist (cm) Vel (m/s) Norm Vel (m/s)  Left Peroneal Motor (Ext Dig Brev)  32C  Ankle    2.7 <6.0 2.6 >2.5 B Fib Ankle 8.0 37.0 46 >40  B Fib    10.7  2.4  Poplt B Fib 1.6 8.0 50 >40  Poplt    12.3  2.4          Right Peroneal Motor (Ext Dig Brev)  32C  Ankle    2.5 <6.0 4.1 >2.5 B Fib Ankle 8.0 37.0 46 >40  B Fib    10.5  3.7  Poplt B Fib 1.5 8.0 53 >40  Poplt    12.0  3.6         Left Tibial Motor (Abd Hall Brev)  32C  Ankle    4.0 <6.0 8.2 >4 Knee Ankle 8.2 38.0 46 >40  Knee    12.2  5.4         Right Tibial Motor (Abd Hall Brev)  32C  Ankle    4.0 <6.0 13.2 >4 Knee Ankle 8.3 37.0 45 >40  Knee    12.3  9.0          H Reflex Studies   NR H-Lat (ms) Lat Norm (ms) L-R H-Lat (ms)  Left Tibial (Gastroc)  32C     32.24 <35 0.00  Right Tibial (Gastroc)  32C     32.24 <35 0.00   EMG   Side Muscle Ins Act Fibs Psw Fasc Number Recrt Dur Dur. Amp Amp. Poly Poly. Comment  Right AntTibialis Nml Nml Nml Nml Nml Nml Nml Nml Nml Nml Nml Nml N/A  Right Gastroc Nml Nml Nml Nml Nml  Nml Nml Nml Nml Nml Nml Nml N/A  Right Flex Dig Long Nml Nml Nml Nml Nml Nml Nml Nml Nml Nml Nml Nml N/A  Left AntTibialis Nml Nml Nml Nml Nml Nml Nml Nml Nml Nml Nml Nml N/A  Left Gastroc Nml Nml Nml Nml Nml Nml Nml Nml Nml Nml Nml Nml N/A  Left Flex Dig Long Nml Nml Nml Nml Nml Nml Nml Nml Nml Nml Nml Nml N/A  Left RectFemoris Nml Nml Nml Nml Nml Nml Nml Nml Nml Nml Nml Nml N/A  Left GluteusMed Nml Nml Nml Nml Nml Nml Nml Nml Nml Nml Nml Nml N/A      Waveforms:

## 2019-02-22 DIAGNOSIS — H26492 Other secondary cataract, left eye: Secondary | ICD-10-CM | POA: Diagnosis not present

## 2019-03-01 ENCOUNTER — Telehealth: Payer: Self-pay | Admitting: Sports Medicine

## 2019-03-01 NOTE — Telephone Encounter (Signed)
Pt had neurology tests done on 02/12/19 and has not heard anything regarding her results. Please give patient a call.

## 2019-03-15 ENCOUNTER — Other Ambulatory Visit: Payer: Self-pay | Admitting: Internal Medicine

## 2019-03-18 ENCOUNTER — Telehealth: Payer: Self-pay | Admitting: *Deleted

## 2019-03-18 ENCOUNTER — Telehealth: Payer: Self-pay

## 2019-03-18 NOTE — Telephone Encounter (Signed)
LVM to Pt stating to give [place] a call back to our office to go over her nerve study results.

## 2019-03-18 NOTE — Telephone Encounter (Signed)
Kathleen Greer will you let the patient know that I apologize that we did not call her sooner with the results. I never saw them come across my desk but now I have reviewed them and the good news is that she does not have any nerve damage to her larger nerves. The numbness that she is feeling may be the result of her smaller nerves in the foot of which the nerve test can not detect accurately. She should be sure to keep her blood sugars/diabetes under control. 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 Also patient should take Vit B complex and 600mg  of Alpha lipoic acid a day of which she can get over the counter at any vitamin shop, drug store, pharmacy, or grocery store. She should try these vitamins for at least 6 weeks everyday. If her symptoms continue after 6 weeks or she does not notice a difference she should return to office. Thanks Dr. Cannon Kettle

## 2019-03-18 NOTE — Telephone Encounter (Signed)
Patient called stating that she still has not heard regarding her results of the nerve study.  States she is still having that same issues.  Please advise.

## 2019-03-19 ENCOUNTER — Telehealth: Payer: Self-pay

## 2019-03-19 DIAGNOSIS — H26492 Other secondary cataract, left eye: Secondary | ICD-10-CM | POA: Diagnosis not present

## 2019-03-19 NOTE — Telephone Encounter (Signed)
Called Pt again today to go over her nerve study results. Pt did not answered. LVM stating  to give [place] a call back to the office to go over her results

## 2019-03-19 NOTE — Telephone Encounter (Signed)
Patient called back after missing several calls with the [place].  I gave her Dr Leeanne Rio message regarding the test results and instructions.  She stated understanding and will do as instructed and follow up in 6 weeks if she does not see any changes.

## 2019-03-19 NOTE — Telephone Encounter (Signed)
Left another Voice message to the Pt. Stating to return our phone call to go over her nerve study results and Dr's orders.

## 2019-03-25 ENCOUNTER — Other Ambulatory Visit: Payer: Self-pay | Admitting: Internal Medicine

## 2019-03-25 DIAGNOSIS — R7302 Impaired glucose tolerance (oral): Secondary | ICD-10-CM

## 2019-04-02 DIAGNOSIS — H1045 Other chronic allergic conjunctivitis: Secondary | ICD-10-CM | POA: Diagnosis not present

## 2019-04-03 ENCOUNTER — Telehealth: Payer: Self-pay | Admitting: Internal Medicine

## 2019-04-03 MED ORDER — ROSUVASTATIN CALCIUM 5 MG PO TABS: ORAL_TABLET | ORAL | 1 refills | Status: DC

## 2019-04-03 NOTE — Telephone Encounter (Signed)
Please prescribe Crestor 5 mg weekly #12 with 1 refill.

## 2019-04-03 NOTE — Telephone Encounter (Signed)
Called patient to let her know I was working one of our list from her insurance company, and ask if she would be interested in taking a satin medication to keep cholesterol under control, and she said sure that it could not hurt to try.

## 2019-04-26 ENCOUNTER — Other Ambulatory Visit: Payer: Medicare Other | Admitting: Internal Medicine

## 2019-04-29 ENCOUNTER — Other Ambulatory Visit: Payer: Self-pay

## 2019-04-29 ENCOUNTER — Other Ambulatory Visit: Payer: Medicare Other | Admitting: Internal Medicine

## 2019-04-29 DIAGNOSIS — Z0000 Encounter for general adult medical examination without abnormal findings: Secondary | ICD-10-CM

## 2019-04-29 DIAGNOSIS — R7302 Impaired glucose tolerance (oral): Secondary | ICD-10-CM | POA: Diagnosis not present

## 2019-04-29 DIAGNOSIS — I10 Essential (primary) hypertension: Secondary | ICD-10-CM

## 2019-04-29 DIAGNOSIS — K219 Gastro-esophageal reflux disease without esophagitis: Secondary | ICD-10-CM

## 2019-04-29 DIAGNOSIS — E8881 Metabolic syndrome: Secondary | ICD-10-CM

## 2019-04-29 DIAGNOSIS — Z13220 Encounter for screening for lipoid disorders: Secondary | ICD-10-CM

## 2019-04-30 LAB — CBC WITH DIFFERENTIAL/PLATELET
Absolute Monocytes: 346 cells/uL (ref 200–950)
Basophils Absolute: 32 cells/uL (ref 0–200)
Basophils Relative: 0.6 %
Eosinophils Absolute: 259 cells/uL (ref 15–500)
Eosinophils Relative: 4.8 %
HCT: 40.7 % (ref 35.0–45.0)
Hemoglobin: 13.9 g/dL (ref 11.7–15.5)
Lymphs Abs: 1976 cells/uL (ref 850–3900)
MCH: 28 pg (ref 27.0–33.0)
MCHC: 34.2 g/dL (ref 32.0–36.0)
MCV: 82.1 fL (ref 80.0–100.0)
MPV: 11.1 fL (ref 7.5–12.5)
Monocytes Relative: 6.4 %
Neutro Abs: 2786 cells/uL (ref 1500–7800)
Neutrophils Relative %: 51.6 %
Platelets: 303 10*3/uL (ref 140–400)
RBC: 4.96 10*6/uL (ref 3.80–5.10)
RDW: 13.7 % (ref 11.0–15.0)
Total Lymphocyte: 36.6 %
WBC: 5.4 10*3/uL (ref 3.8–10.8)

## 2019-04-30 LAB — LIPID PANEL
Cholesterol: 164 mg/dL (ref <200)
HDL: 52 mg/dL (ref >=50)
LDL Cholesterol (Calc): 97 mg/dL (calc)
Non-HDL Cholesterol (Calc): 112 mg/dL (calc) (ref <130)
Total CHOL/HDL Ratio: 3.2 (calc) (ref <5.0)
Triglycerides: 68 mg/dL (ref <150)

## 2019-04-30 LAB — COMPLETE METABOLIC PANEL WITH GFR
AG Ratio: 1.5 (calc) (ref 1.0–2.5)
ALT: 24 U/L (ref 6–29)
AST: 19 U/L (ref 10–35)
Albumin: 4.3 g/dL (ref 3.6–5.1)
Alkaline phosphatase (APISO): 98 U/L (ref 37–153)
BUN/Creatinine Ratio: 21 (calc) (ref 6–22)
BUN: 20 mg/dL (ref 7–25)
CO2: 29 mmol/L (ref 20–32)
Calcium: 9.5 mg/dL (ref 8.6–10.4)
Chloride: 105 mmol/L (ref 98–110)
Creat: 0.97 mg/dL — ABNORMAL HIGH (ref 0.60–0.93)
GFR, Est African American: 68 mL/min/{1.73_m2} (ref >=60)
GFR, Est Non African American: 58 mL/min/{1.73_m2} — ABNORMAL LOW (ref >=60)
Globulin: 2.9 g/dL (calc) (ref 1.9–3.7)
Glucose, Bld: 118 mg/dL — ABNORMAL HIGH (ref 65–99)
Potassium: 4.2 mmol/L (ref 3.5–5.3)
Sodium: 142 mmol/L (ref 135–146)
Total Bilirubin: 0.7 mg/dL (ref 0.2–1.2)
Total Protein: 7.2 g/dL (ref 6.1–8.1)

## 2019-04-30 LAB — HEMOGLOBIN A1C
Hgb A1c MFr Bld: 6.4 % of total Hgb — ABNORMAL HIGH (ref <5.7)
Mean Plasma Glucose: 137 (calc)
eAG (mmol/L): 7.6 (calc)

## 2019-04-30 LAB — TSH: TSH: 1.94 mIU/L (ref 0.40–4.50)

## 2019-05-02 ENCOUNTER — Ambulatory Visit (INDEPENDENT_AMBULATORY_CARE_PROVIDER_SITE_OTHER): Payer: Medicare Other | Admitting: Internal Medicine

## 2019-05-02 ENCOUNTER — Encounter: Payer: Self-pay | Admitting: Internal Medicine

## 2019-05-02 ENCOUNTER — Other Ambulatory Visit: Payer: Self-pay

## 2019-05-02 VITALS — BP 170/100 | HR 74 | Temp 98.0°F | Ht 64.0 in | Wt 173.0 lb

## 2019-05-02 DIAGNOSIS — Z86010 Personal history of colonic polyps: Secondary | ICD-10-CM | POA: Diagnosis not present

## 2019-05-02 DIAGNOSIS — Z0000 Encounter for general adult medical examination without abnormal findings: Secondary | ICD-10-CM | POA: Diagnosis not present

## 2019-05-02 DIAGNOSIS — M1711 Unilateral primary osteoarthritis, right knee: Secondary | ICD-10-CM

## 2019-05-02 DIAGNOSIS — K219 Gastro-esophageal reflux disease without esophagitis: Secondary | ICD-10-CM

## 2019-05-02 DIAGNOSIS — Z6829 Body mass index (BMI) 29.0-29.9, adult: Secondary | ICD-10-CM | POA: Diagnosis not present

## 2019-05-02 DIAGNOSIS — I10 Essential (primary) hypertension: Secondary | ICD-10-CM | POA: Diagnosis not present

## 2019-05-02 DIAGNOSIS — Z860101 Personal history of adenomatous and serrated colon polyps: Secondary | ICD-10-CM

## 2019-05-02 DIAGNOSIS — E119 Type 2 diabetes mellitus without complications: Secondary | ICD-10-CM

## 2019-05-02 LAB — POCT URINALYSIS DIPSTICK
Appearance: NEGATIVE
Bilirubin, UA: NEGATIVE
Blood, UA: NEGATIVE
Glucose, UA: NEGATIVE
Ketones, UA: NEGATIVE
Leukocytes, UA: NEGATIVE
Nitrite, UA: NEGATIVE
Odor: NEGATIVE
Protein, UA: NEGATIVE
Spec Grav, UA: 1.01 (ref 1.010–1.025)
Urobilinogen, UA: 0.2 E.U./dL
pH, UA: 6.5 (ref 5.0–8.0)

## 2019-05-19 ENCOUNTER — Encounter: Payer: Self-pay | Admitting: Internal Medicine

## 2019-05-19 NOTE — Progress Notes (Signed)
Subjective:    Patient ID: Kathleen Greer, female    DOB: [birth year], 73 y.o.   MRN: [id number]  HPI 74 year old female in today for health maintenance exam, Medicare wellness exam and evaluation of medical issues.  History of GE reflux, controlled type 2 diabetes mellitus, dependent edema, hypokalemia.  History of scoliosis of the lumbar spine.  Issues with  osteoarthritis of right knee.  Hypokalemia secondary to diuretic therapy.  History of anxiety which is improved over the past few years.  History of positional vertigo.  Had colonoscopy Aug 01, 2013 which was normal with 10-year follow-up recommended.  There is a remote history of adenomatous polyps.  Hysterectomy without oophorectomy 1971.  Right rotator cuff surgery June 2018.  Right carpal tunnel release Aug 01, 2001.  Penicillin causes a rash.  Social history: Patient is retired and formerly worked at [employer] as a Location manager.  She does not smoke or consume alcohol.  Family history: Adult son with hypertension.  Patient's father died with an MI.  Mother died of an MI.  1 brother died with heart failure.  Another brother died in 2004-08-01 with cirrhosis of the liver.  Sister died at age 68.  Another sister whose health is apparently unchanged.  GE reflux treated with omeprazole  History of hypokalemia due to diuretic therapy but potassium is normal on potassium supplement.  History of anxiety state about her health and situational stress but does not take medication  Review of Systems aside from musculoskeletal pain seems to be doing well with no new complaints     Objective:   Physical Exam Blood pressure on arrival 170/100 rechecked was 130/84.  Weight 173 pounds.  BMI 29.70 skin warm and dry.  Nodes none.  TMs and pharynx are clear.  Neck is supple.  No thyromegaly or carotid bruits.  Chest is clear to auscultation without rales or wheezing.  Breast without masses.  Cardiac exam regular rate and rhythm normal S1 and S2 without  murmurs or gallops.  Abdomen obese soft nondistended without hepatosplenomegaly masses or tenderness.  Bimanual is normal.  Rectovaginal confirms.  No lower extremity pitting edema.  Neuro intact without focal deficits.  Thought judgment and affect are normal.       Assessment & Plan:  Blood pressure reading was elevated on arrival but improved.  Continue current medications and she will watch blood pressure at home and call me if persistently elevated.  Controlled type 2 diabetes without complication.  She is on Metformin and hemoglobin A1c is stable at 6.4%  History of GE reflux treated with omeprazole  Dependent edema treated with Demadex  History of hypokalemia secondary to Demadex therapy and takes potassium supplement and now has normal potassium  Osteoarthritis of right knee and generalized musculoskeletal pain  History of adenomatous colon polyps due for repeat colonoscopy 2023-08-02  Anxiety state but does not take medication  Plan: Return in 6 months and continue current medications.  Watch blood pressure at home.  Subjective:   Patient presents for Medicare Annual/Subsequent preventive examination.  Review Past Medical/Family/Social: See above  Risk Factors  Current exercise habits: Not a lot of exercise due to arthritis in knees Dietary issues discussed: Low-fat low carbohydrate  Cardiac risk factors: Diabetes mellitus and family history of MI  Depression Screen  (Note: if answer to either of the following is "Yes", a more complete depression screening is indicated)   Over the past two weeks, have you felt down, depressed or hopeless? No  Over the past two weeks,  have you felt little interest or pleasure in doing things? No Have you lost interest or pleasure in daily life? No Do you often feel hopeless? No Do you cry easily over simple problems? No   Activities of Daily Living  In your present state of health, do you have any difficulty performing the following  activities?:   Driving? No  Managing money? No  Feeding yourself? No  Getting from bed to chair? No  Climbing a flight of stairs? No  Preparing food and eating?: No  Bathing or showering? No  Getting dressed: No  Getting to the toilet? No  Using the toilet:No  Moving around from place to place: Sometimes due to arthritis in knees In the past year have you fallen or had a near fall?:  1 without injury Are you sexually active?  Yes Do you have more than one partner? No   Hearing Difficulties:  Do you often ask people to speak up or repeat themselves?  Yes Do you experience ringing or noises in your ears? No  Do you have difficulty understanding soft or whispered voices?  Yes Do you feel that you have a problem with memory? No Do you often misplace items?  Sometimes   Home Safety:  Do you have a smoke alarm at your residence? Yes Do you have grab bars in the bathroom?  None Do you have throw rugs in your house?  Yes   Cognitive Testing  Alert? Yes Normal Appearance?Yes  Oriented to person? Yes Place? Yes  Time? Yes  Recall of three objects? Yes  Can perform simple calculations? Yes  Displays appropriate judgment?Yes  Can read the correct time from a watch face?Yes   List the Names of Other Physician/Practitioners you currently use:  See referral list for the physicians patient is currently seeing.     Review of Systems: See above   Objective:     General appearance: Appears stated age and mildly obese  Head: Normocephalic, without obvious abnormality, atraumatic  Eyes: conj clear, EOMi PEERLA  Ears: normal TM's and external ear canals both ears  Nose: Nares normal. Septum midline. Mucosa normal. No drainage or sinus tenderness.  Throat: lips, mucosa, and tongue normal; teeth and gums normal  Neck: no adenopathy, no carotid bruit, no JVD, supple, symmetrical, trachea midline and thyroid not enlarged, symmetric, no tenderness/mass/nodules  No CVA tenderness.    Lungs: clear to auscultation bilaterally  Breasts: normal appearance, no masses or tenderness Heart: regular rate and rhythm, S1, S2 normal, no murmur, click, rub or gallop  Abdomen: soft, non-tender; bowel sounds normal; no masses, no organomegaly  Musculoskeletal: ROM normal in all joints, no crepitus, no deformity, Normal muscle strengthen. Back  is symmetric, no curvature. Skin: Skin color, texture, turgor normal. No rashes or lesions  Lymph nodes: Cervical, supraclavicular, and axillary nodes normal.  Neurologic: CN 2 -12 Normal, Normal symmetric reflexes. Normal coordination and gait  Psych: Alert & Oriented x 3, Mood appear stable.    Assessment:    Annual wellness medicare exam   Plan:    During the course of the visit the patient was educated and counseled about appropriate screening and preventive services including:   Annual mammogram  Annual flu vaccine recommended  Take Covid vaccine when available     Patient Instructions (the written plan) was given to the patient.  Medicare Attestation  I have personally reviewed:  The patient's medical and social history  Their use of alcohol, tobacco or illicit drugs  Their current medications and supplements  The patient's functional ability including ADLs,fall risks, home safety risks, cognitive, and hearing and visual impairment  Diet and physical activities  Evidence for depression or mood disorders  The patient's weight, height, BMI, and visual acuity have been recorded in the chart. I have made referrals, counseling, and provided education to the patient based on review of the above and I have provided the patient with a written personalized care plan for preventive services.

## 2019-05-19 NOTE — Patient Instructions (Signed)
It was a pleasure to see you today.  Continue to work on diet and exercise efforts.  Labs are stable.  Follow-up in 6 months.  No change in medications.

## 2019-05-24 ENCOUNTER — Other Ambulatory Visit: Payer: Self-pay

## 2019-05-24 DIAGNOSIS — I739 Peripheral vascular disease, unspecified: Secondary | ICD-10-CM

## 2019-05-29 ENCOUNTER — Ambulatory Visit (HOSPITAL_COMMUNITY): Payer: Medicare Other

## 2019-05-30 ENCOUNTER — Ambulatory Visit (HOSPITAL_COMMUNITY)
Admission: RE | Admit: 2019-05-30 | Discharge: 2019-05-30 | Disposition: A | Discharge status: Home / Self Care | Payer: Medicare Other | Source: Ambulatory Visit | Attending: Internal Medicine | Admitting: Internal Medicine

## 2019-05-30 ENCOUNTER — Ambulatory Visit (HOSPITAL_COMMUNITY): Payer: Medicare Other

## 2019-05-30 ENCOUNTER — Other Ambulatory Visit: Payer: Self-pay

## 2019-05-30 DIAGNOSIS — I739 Peripheral vascular disease, unspecified: Secondary | ICD-10-CM | POA: Diagnosis not present

## 2019-05-30 NOTE — Progress Notes (Signed)
ABI has been completed.   Preliminary results in CV Proc.   Blanch Media 05/30/2019 9:34 AM

## 2019-06-04 ENCOUNTER — Other Ambulatory Visit: Payer: Self-pay

## 2019-06-04 ENCOUNTER — Ambulatory Visit (INDEPENDENT_AMBULATORY_CARE_PROVIDER_SITE_OTHER): Payer: Medicare Other | Admitting: Internal Medicine

## 2019-06-04 ENCOUNTER — Encounter: Payer: Self-pay | Admitting: Internal Medicine

## 2019-06-04 VITALS — BP 140/80 | HR 60 | Temp 97.9°F | Ht 64.0 in | Wt 172.0 lb

## 2019-06-04 DIAGNOSIS — R0989 Other specified symptoms and signs involving the circulatory and respiratory systems: Secondary | ICD-10-CM

## 2019-06-05 ENCOUNTER — Ambulatory Visit: Payer: Medicare Other | Admitting: Internal Medicine

## 2019-06-08 NOTE — Addendum Note (Signed)
Addended by: Margaree Mackintosh on: 06/08/2019 05:59 PM   Modules accepted: Level of Service

## 2019-06-08 NOTE — Patient Instructions (Addendum)
Although she has an abnormal left toe brachial index, dorsalis pedis pulse in left foot is normal.  She has no foot abnormalities.  No pain.  No claudication.  No further work-up is advised at this time.

## 2019-06-08 NOTE — Progress Notes (Signed)
   Subjective:    Patient ID: Kathleen Greer, female    DOB: [birth year], 74 y.o.   MRN: [id number]  HPI 74 year old Female type II edema, GE reflux, hypokalemia recently seen May 02, 2019 for Medicare wellness and health maintenance exam as well as evaluation of medical issues.  At that time was noted to have good pulses in her feet.  Subsequently patient was seen at home by [employer].  She had portable ABI studies done indicating possible peripheral vascular disease.  We ordered ABI studies on February 11 which were performed at [facility] vascular lab.  Patient now here for follow-up of those studies.    Review of Systems she has no symptoms of claudication     Objective:   Physical Exam Vital signs reviewed.  Blood pressure 140/80 pulse 60 and regular She has excellent dorsalis pedis pulses bilaterally and 1+ posterior tibial pulses bilaterally.  Feet are warm.  No lesions on feet.  They are soft to touch without calluses.  ABI studies showed right toe- brachial index to be normal.  Left toe brachial index was abnormal 0.62  At with normal being 0.7 to  0.8      Assessment & Plan:  Patient has no complaints of claudication or any issues with diabetic foot disease whatsoever.  Her left foot is warm and she has an excellent dorsalis pedis pulse.  Therefore I do not think any further work-up is indicated at this time.  This was explained to the patient in detail while she was called to have these studies at vascular lab after [employer] found abnormality on home test.

## 2019-09-29 ENCOUNTER — Other Ambulatory Visit: Payer: Self-pay | Admitting: Internal Medicine

## 2019-09-30 NOTE — Telephone Encounter (Signed)
Book CPE for January before refilling

## 2019-09-30 NOTE — Telephone Encounter (Signed)
Left detailed message.   

## 2019-10-18 ENCOUNTER — Ambulatory Visit: Payer: Medicare Other | Admitting: Sports Medicine

## 2019-10-18 ENCOUNTER — Encounter: Payer: Self-pay | Admitting: Sports Medicine

## 2019-10-18 ENCOUNTER — Other Ambulatory Visit: Payer: Self-pay

## 2019-10-18 ENCOUNTER — Other Ambulatory Visit: Payer: Self-pay | Admitting: Sports Medicine

## 2019-10-18 DIAGNOSIS — M79672 Pain in left foot: Secondary | ICD-10-CM

## 2019-10-18 DIAGNOSIS — E8881 Metabolic syndrome: Secondary | ICD-10-CM

## 2019-10-18 DIAGNOSIS — M792 Neuralgia and neuritis, unspecified: Secondary | ICD-10-CM | POA: Diagnosis not present

## 2019-10-18 DIAGNOSIS — L600 Ingrowing nail: Secondary | ICD-10-CM

## 2019-10-18 DIAGNOSIS — M79675 Pain in left toe(s): Secondary | ICD-10-CM | POA: Diagnosis not present

## 2019-10-18 MED ORDER — NEOMYCIN-POLYMYXIN-HC 3.5-10000-1 OT SOLN: OTIC | 0 refills | Status: DC

## 2019-10-18 MED ORDER — GABAPENTIN 300 MG PO CAPS: 300.0000 mg | ORAL_CAPSULE | Freq: Every day | ORAL | 3 refills | Status: DC

## 2019-10-18 NOTE — Progress Notes (Signed)
Subjective: Kathleen Greer is a 74 y.o. female patient presents to office today complaining of a moderately painful incurvated, lateral nail border of the first toe on the left foot. This has been present for months and reports the numbness in her toes never went away since last visit, pain is worse at night and with heels can only wear 2 types of shoes comfortably. Patient denies fever/chills/nausea/vomitting/any other related constitutional symptoms at this time.  Patient Active Problem List   Diagnosis Date Noted  . Osteoarthritis of right knee 09/15/2017  . Epistaxis 07/12/2016  . Impaired glucose tolerance 09/04/2015  . Metabolic syndrome 40/97/3532  . Anxiety 06/02/2011  . Hypertension 01/12/2011  . GE reflux 01/12/2011  . Dependent edema 01/12/2011  . Benign positional vertigo 01/12/2011  . Hx of adenomatous colonic polyps 01/12/2011    Current Outpatient Medications on File Prior to Visit  Medication Sig Dispense Refill  . Alpha-Lipoic Acid 600 MG CAPS Take by mouth.    Marland Kitchen amLODipine (NORVASC) 10 MG tablet TAKE 1 TABLET BY MOUTH  DAILY 90 tablet 3  . aspirin 81 MG tablet Take 81 mg by mouth daily.      . Blood Glucose Monitoring Suppl (ONE TOUCH ULTRA 2) w/Device KIT     . Cholecalciferol (VITAMIN D) 1000 UNITS capsule Take 2,000 Units by mouth daily.     Marland Kitchen losartan (COZAAR) 100 MG tablet TAKE 1 TABLET BY MOUTH  DAILY 90 tablet 3  . metFORMIN (GLUCOPHAGE) 1000 MG tablet Take 1 tablet by mouth once daily with breakfast 90 tablet 0  . omeprazole (PRILOSEC) 20 MG capsule Take 1 capsule (20 mg total) by mouth daily. 90 capsule 3  . ONE TOUCH ULTRA TEST test strip     . ONETOUCH DELICA LANCETS FINE MISC     . potassium chloride SA (K-DUR,KLOR-CON) 20 MEQ tablet Take 1 tablet (20 mEq total) by mouth 2 (two) times daily. 180 tablet 3  . rosuvastatin (CRESTOR) 5 MG tablet Take 1 tablet by mouth once a week 12 tablet 0  . torsemide (DEMADEX) 20 MG tablet TAKE ONE OR TWO tabs daily 180  tablet 3  . [DISCONTINUED] potassium chloride (KLOR-CON) 20 MEQ packet Take 20 mEq by mouth daily. 30 packet 11   No current facility-administered medications on file prior to visit.    Allergies  Allergen Reactions  . Penicillin G Other (See Comments)    Other  . Penicillins     REACTION: Skin irritation    Objective:  There were no vitals filed for this visit.  General: Well developed, nourished, in no acute distress, alert and oriented x3   Dermatology: Skin is warm, dry and supple bilateral. Left hallux nail appears to be  mildly incurvated with hyperkeratosis formation at the distal aspects of  the lateral nail border. (-) Erythema. (+) Edema. (-) serosanguous  drainage present. The remaining nails appear unremarkable at this time. There are no open sores, lesions or other signs of infection  present.  Vascular: Dorsalis Pedis artery and Posterior Tibial artery pedal pulses are 1/4 bilateral with immedate capillary fill time. Pedal hair growth present. No lower extremity edema.   Neruologic: Grossly intact via light touch bilateral. Numbness to toes 1 and 2 on left foot.  Musculoskeletal: Decreased 1st MTPJ L>R. Muscular strength within normal limits in all groups bilateral.   Assesement and Plan: Problem List Items Addressed This Visit      Other   Metabolic syndrome    Other Visit Diagnoses  Ingrown nail    -  Primary   Toe pain, left       Neuritis          -Discussed treatment alternatives and plan of care; Explained permanent/temporary nail avulsion and post procedure course to patient. Patient elects for PNA Left hallux lateral margin - After a verbal and written consent, injected 3 ml of a 50:50 mixture of 2% plain  lidocaine and 0.5% plain marcaine in a normal hallux block fashion. Next, a  betadine prep was performed. Anesthesia was tested and found to be appropriate.  The offending Left hallux lateral nail border was then incised from the hyponychium  to the epinychium. The offending nail border was removed and cleared from the field. The area was curretted for any remaining nail or spicules. Phenol application performed and the area was then flushed with alcohol and dressed with antibiotic cream and a dry sterile dressing. -Patient was instructed to leave the dressing intact for today and begin soaking  in a weak solution of betadine or Epsom salt and water tomorrow. Patient was instructed to  soak for 15-20 minutes each day and apply neosporin/corticosporin and a gauze or bandaid dressing each day. -Patient was instructed to monitor the toe for signs of infection and return to office if toe becomes red, hot or swollen. -Advised ice, elevation, and tylenol or motrin if needed for pain.  -Rx Gabapentin 300 QHS for neuritis -Patient is to return in 2-3 weeks for follow up care/nail check or sooner if problems arise.  Landis Martins, DPM

## 2019-10-18 NOTE — Patient Instructions (Signed)

## 2019-10-28 ENCOUNTER — Other Ambulatory Visit: Payer: Self-pay

## 2019-10-28 ENCOUNTER — Other Ambulatory Visit: Payer: Medicare Other | Admitting: Internal Medicine

## 2019-10-28 DIAGNOSIS — E119 Type 2 diabetes mellitus without complications: Secondary | ICD-10-CM | POA: Diagnosis not present

## 2019-10-28 DIAGNOSIS — I10 Essential (primary) hypertension: Secondary | ICD-10-CM

## 2019-10-29 LAB — HEPATIC FUNCTION PANEL
AG Ratio: 1.6 (calc) (ref 1.0–2.5)
ALT: 20 U/L (ref 6–29)
AST: 18 U/L (ref 10–35)
Albumin: 4.1 g/dL (ref 3.6–5.1)
Alkaline phosphatase (APISO): 88 U/L (ref 37–153)
Bilirubin, Direct: 0.2 mg/dL (ref 0.0–0.2)
Globulin: 2.6 g/dL (calc) (ref 1.9–3.7)
Indirect Bilirubin: 0.7 mg/dL (calc) (ref 0.2–1.2)
Total Bilirubin: 0.9 mg/dL (ref 0.2–1.2)
Total Protein: 6.7 g/dL (ref 6.1–8.1)

## 2019-10-29 LAB — LIPID PANEL
Cholesterol: 179 mg/dL (ref <200)
HDL: 56 mg/dL (ref >=50)
LDL Cholesterol (Calc): 105 mg/dL (calc) — ABNORMAL HIGH
Non-HDL Cholesterol (Calc): 123 mg/dL (calc) (ref <130)
Total CHOL/HDL Ratio: 3.2 (calc) (ref <5.0)
Triglycerides: 87 mg/dL (ref <150)

## 2019-10-29 LAB — HEMOGLOBIN A1C
Hgb A1c MFr Bld: 6.2 % of total Hgb — ABNORMAL HIGH (ref <5.7)
Mean Plasma Glucose: 131 (calc)
eAG (mmol/L): 7.3 (calc)

## 2019-11-01 ENCOUNTER — Ambulatory Visit (INDEPENDENT_AMBULATORY_CARE_PROVIDER_SITE_OTHER): Payer: Medicare Other | Admitting: Internal Medicine

## 2019-11-01 ENCOUNTER — Other Ambulatory Visit: Payer: Self-pay

## 2019-11-01 ENCOUNTER — Encounter: Payer: Self-pay | Admitting: Internal Medicine

## 2019-11-01 VITALS — BP 110/90 | HR 70 | Ht 64.0 in | Wt 170.0 lb

## 2019-11-01 DIAGNOSIS — E119 Type 2 diabetes mellitus without complications: Secondary | ICD-10-CM

## 2019-11-01 DIAGNOSIS — I10 Essential (primary) hypertension: Secondary | ICD-10-CM

## 2019-11-01 DIAGNOSIS — K219 Gastro-esophageal reflux disease without esophagitis: Secondary | ICD-10-CM

## 2019-11-01 DIAGNOSIS — Z23 Encounter for immunization: Secondary | ICD-10-CM

## 2019-11-01 DIAGNOSIS — E8881 Metabolic syndrome: Secondary | ICD-10-CM

## 2019-11-01 NOTE — Progress Notes (Signed)
   Subjective:    Patient ID: Kathleen Greer, female    DOB: 21-Dec-1945, 74 y.o.   MRN: [id number]  HPI  74 year old Female for 6 month recheck. Has done well during the pandemic. Is careful about where she goes. Has been safe at home. Her grandson stays with her son but is now staying more with his mother.  Patient has history of GE reflux, controlled type 2 diabetes mellitus, dependent edema, hypokalemia.  History of scoliosis of the lumbar spine and osteoarthritis of the right knee.  History of anxiety which has improved over the past few years.  Hypokalemia secondary to diuretic therapy.  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  Does not smoke or consume alcohol.  Family history: Adult son with hypertension.  Patient's father died with an MI.  Mother died of an MI.  1 brother died with heart failure.  Another brother died with cirrhosis of the liver.  Sister died at age 55.  1 sister living.  Hemoglobin A1c is stable at 6.2% with Metformin.  She takes Crestor 5 mg once a week.  She takes torsemide for hypertension and dependent edema.  She is on Prilosec for GE reflux.  She takes amlodipine and losartan for hypertension.  Her lipid panel is essentially normal except for an LDL of 105 on once a week Crestor.  Liver functions are normal.   Review of Systems no new complaints-feels well-needs tetanus immunization update for recent injury     Objective:   Physical Exam  Blood pressure 110/90 pulse 70 pulse oximetry 98% weight 170 pounds BMI 29.18  Skin warm and dry.  No thyromegaly.  No carotid bruits.  Chest clear to auscultation.  Cardiac exam regular rate and rhythm normal S1 and S2 without murmurs or gallops.  No lower extremity pitting edema.  Affect thought and judgment are normal.      Assessment & Plan:  Type 2 diabetes mellitus-stable with Metformin  Hyperlipidemia-stable with Crestor 5 mg once a week  Essential  hypertension-excellent control on current regimen  GE reflux treated with PPI and stable  History of dependent edema treated with diuretics  History of hypokalemia treated with potassium supplement-B-Met not checked today but potassium will be checked at next visit  Plan: Return in 6 months for health maintenance exam, Medicare wellness and evaluation of medical issues.

## 2019-11-07 ENCOUNTER — Other Ambulatory Visit: Payer: Self-pay

## 2019-11-07 ENCOUNTER — Ambulatory Visit: Payer: Medicare Other | Admitting: Sports Medicine

## 2019-11-07 ENCOUNTER — Encounter: Payer: Self-pay | Admitting: Sports Medicine

## 2019-11-07 DIAGNOSIS — Z9889 Other specified postprocedural states: Secondary | ICD-10-CM

## 2019-11-07 DIAGNOSIS — M792 Neuralgia and neuritis, unspecified: Secondary | ICD-10-CM

## 2019-11-07 DIAGNOSIS — M79675 Pain in left toe(s): Secondary | ICD-10-CM

## 2019-11-07 NOTE — Progress Notes (Signed)
Subjective: Kathleen Greer is a 74 y.o. female patient returns to office today for follow up evaluation after having Left Hallux lateral permanent nail avulsion performed on (10-18-19). Patient has been soaking using epsom salt and applying topical antibiotic covered with bandaid daily. Patient admits occasional shooting pain to the pain and numbness to the ball, has not tried Gabapentin yet, patient denies fever/chills/nausea/vomitting/any other related constitutional symptoms at this time.  Patient Active Problem List   Diagnosis Date Noted  . Osteoarthritis of right knee 09/15/2017  . Epistaxis 07/12/2016  . Impaired glucose tolerance 09/04/2015  . Metabolic syndrome 02/01/5101  . Anxiety 06/02/2011  . Hypertension 01/12/2011  . GE reflux 01/12/2011  . Dependent edema 01/12/2011  . Benign positional vertigo 01/12/2011  . Hx of adenomatous colonic polyps 01/12/2011    Current Outpatient Medications on File Prior to Visit  Medication Sig Dispense Refill  . Alpha-Lipoic Acid 600 MG CAPS Take by mouth.    Marland Kitchen amLODipine (NORVASC) 10 MG tablet TAKE 1 TABLET BY MOUTH  DAILY 90 tablet 3  . aspirin 81 MG tablet Take 81 mg by mouth daily.      . Blood Glucose Monitoring Suppl (ONE TOUCH ULTRA 2) w/Device KIT     . Cholecalciferol (VITAMIN D) 1000 UNITS capsule Take 2,000 Units by mouth daily.     Marland Kitchen gabapentin (NEURONTIN) 300 MG capsule Take 1 capsule (300 mg total) by mouth at bedtime. 90 capsule 3  . losartan (COZAAR) 100 MG tablet TAKE 1 TABLET BY MOUTH  DAILY 90 tablet 3  . metFORMIN (GLUCOPHAGE) 1000 MG tablet Take 1 tablet by mouth once daily with breakfast 90 tablet 0  . neomycin-polymyxin-hydrocortisone (CORTISPORIN) OTIC solution Apply 2-3 drops to the ingrown toenail site twice daily. Cover with band-aid. 10 mL 0  . omeprazole (PRILOSEC) 20 MG capsule Take 1 capsule (20 mg total) by mouth daily. 90 capsule 3  . ONE TOUCH ULTRA TEST test strip     . ONETOUCH DELICA LANCETS FINE MISC      . potassium chloride SA (K-DUR,KLOR-CON) 20 MEQ tablet Take 1 tablet (20 mEq total) by mouth 2 (two) times daily. 180 tablet 3  . rosuvastatin (CRESTOR) 5 MG tablet Take 1 tablet by mouth once a week 12 tablet 0  . torsemide (DEMADEX) 20 MG tablet TAKE ONE OR TWO tabs daily 180 tablet 3  . [DISCONTINUED] potassium chloride (KLOR-CON) 20 MEQ packet Take 20 mEq by mouth daily. 30 packet 11   No current facility-administered medications on file prior to visit.    Allergies  Allergen Reactions  . Penicillin G Other (See Comments)    Other  . Penicillins     REACTION: Skin irritation    Objective:  General: Well developed, nourished, in no acute distress, alert and oriented x3   Dermatology: Skin is warm, dry and supple bilateral. Left hallux lateral nail bed appears to be clean, dry, with mild granular tissue and surrounding eschar/scab. (-) Erythema. (-) Edema. (-) serosanguous drainage present. The remaining nails appear unremarkable at this time. There are no other lesions or other signs of infection present.  Neurovascular status: Intact. No lower extremity swelling; No pain with calf compression bilateral.  Subjective numbness to the ball of the left foot.  Musculoskeletal: Decreased tenderness to palpation of the left hallux nail fold(s). Muscular strength within normal limits bilateral.   Assesement and Plan: Problem List Items Addressed This Visit    None    Visit Diagnoses    S/P nail  surgery    -  Primary   Toe pain, left       Neuritis          -Examined patient  -Cleansed left hallux lateral nail fold and covered with bandaid.  -Discussed plan of care with patient. -Patient may discontinue soaking and may leave open to air -Educated patient on long term care after nail surgery. -Patient was instructed to monitor the toe for reoccurrence and signs of infection; Patient advised to return to office or go to ER if toe becomes red, hot or swollen. -Advised patient to  try to take her gabapentin for the numbness to the ball of the foot -Patient is to return as needed or sooner if problems arise.  Landis Martins, DPM

## 2019-11-25 ENCOUNTER — Other Ambulatory Visit: Payer: Self-pay | Admitting: Internal Medicine

## 2019-11-25 DIAGNOSIS — Z1231 Encounter for screening mammogram for malignant neoplasm of breast: Secondary | ICD-10-CM

## 2019-12-07 NOTE — Patient Instructions (Signed)
It was a pleasure to see you today.  Please continue current medications and follow-up for health maintenance exam in 6 months.

## 2020-01-02 ENCOUNTER — Ambulatory Visit
Admission: RE | Admit: 2020-01-02 | Discharge: 2020-01-02 | Disposition: A | Discharge status: Home / Self Care | Payer: Medicare Other | Source: Ambulatory Visit | Attending: Internal Medicine | Admitting: Internal Medicine

## 2020-01-02 ENCOUNTER — Other Ambulatory Visit: Payer: Self-pay

## 2020-01-02 DIAGNOSIS — Z1231 Encounter for screening mammogram for malignant neoplasm of breast: Secondary | ICD-10-CM | POA: Diagnosis not present

## 2020-01-13 ENCOUNTER — Other Ambulatory Visit: Payer: Self-pay | Admitting: Internal Medicine

## 2020-03-26 DIAGNOSIS — L03032 Cellulitis of left toe: Secondary | ICD-10-CM | POA: Diagnosis not present

## 2020-03-26 DIAGNOSIS — L03039 Cellulitis of unspecified toe: Secondary | ICD-10-CM | POA: Diagnosis not present

## 2020-05-01 ENCOUNTER — Other Ambulatory Visit: Payer: Medicare Other | Admitting: Internal Medicine

## 2020-05-01 ENCOUNTER — Other Ambulatory Visit: Payer: Self-pay

## 2020-05-01 DIAGNOSIS — Z860101 Personal history of adenomatous and serrated colon polyps: Secondary | ICD-10-CM

## 2020-05-01 DIAGNOSIS — E119 Type 2 diabetes mellitus without complications: Secondary | ICD-10-CM | POA: Diagnosis not present

## 2020-05-01 DIAGNOSIS — Z86010 Personal history of colonic polyps: Secondary | ICD-10-CM | POA: Diagnosis not present

## 2020-05-01 DIAGNOSIS — I739 Peripheral vascular disease, unspecified: Secondary | ICD-10-CM | POA: Diagnosis not present

## 2020-05-01 DIAGNOSIS — K219 Gastro-esophageal reflux disease without esophagitis: Secondary | ICD-10-CM

## 2020-05-01 DIAGNOSIS — I10 Essential (primary) hypertension: Secondary | ICD-10-CM

## 2020-05-01 DIAGNOSIS — M1711 Unilateral primary osteoarthritis, right knee: Secondary | ICD-10-CM

## 2020-05-01 DIAGNOSIS — Z0000 Encounter for general adult medical examination without abnormal findings: Secondary | ICD-10-CM

## 2020-05-01 DIAGNOSIS — Z6829 Body mass index (BMI) 29.0-29.9, adult: Secondary | ICD-10-CM

## 2020-05-02 LAB — COMPLETE METABOLIC PANEL WITH GFR
AG Ratio: 1.6 (calc) (ref 1.0–2.5)
ALT: 24 U/L (ref 6–29)
AST: 18 U/L (ref 10–35)
Albumin: 4.3 g/dL (ref 3.6–5.1)
Alkaline phosphatase (APISO): 96 U/L (ref 37–153)
BUN/Creatinine Ratio: 18 (calc) (ref 6–22)
BUN: 17 mg/dL (ref 7–25)
CO2: 30 mmol/L (ref 20–32)
Calcium: 9.6 mg/dL (ref 8.6–10.4)
Chloride: 104 mmol/L (ref 98–110)
Creat: 0.97 mg/dL — ABNORMAL HIGH (ref 0.60–0.93)
GFR, Est African American: 67 mL/min/{1.73_m2} (ref >=60)
GFR, Est Non African American: 58 mL/min/{1.73_m2} — ABNORMAL LOW (ref >=60)
Globulin: 2.7 g/dL (calc) (ref 1.9–3.7)
Glucose, Bld: 104 mg/dL — ABNORMAL HIGH (ref 65–99)
Potassium: 3.9 mmol/L (ref 3.5–5.3)
Sodium: 143 mmol/L (ref 135–146)
Total Bilirubin: 1.1 mg/dL (ref 0.2–1.2)
Total Protein: 7 g/dL (ref 6.1–8.1)

## 2020-05-02 LAB — CBC WITH DIFFERENTIAL/PLATELET
Absolute Monocytes: 318 cells/uL (ref 200–950)
Basophils Absolute: 32 cells/uL (ref 0–200)
Basophils Relative: 0.6 %
Eosinophils Absolute: 260 cells/uL (ref 15–500)
Eosinophils Relative: 4.9 %
HCT: 42.3 % (ref 35.0–45.0)
Hemoglobin: 14.6 g/dL (ref 11.7–15.5)
Lymphs Abs: 2608 cells/uL (ref 850–3900)
MCH: 27.8 pg (ref 27.0–33.0)
MCHC: 34.5 g/dL (ref 32.0–36.0)
MCV: 80.6 fL (ref 80.0–100.0)
MPV: 10.6 fL (ref 7.5–12.5)
Monocytes Relative: 6 %
Neutro Abs: 2083 cells/uL (ref 1500–7800)
Neutrophils Relative %: 39.3 %
Platelets: 297 10*3/uL (ref 140–400)
RBC: 5.25 10*6/uL — ABNORMAL HIGH (ref 3.80–5.10)
RDW: 14 % (ref 11.0–15.0)
Total Lymphocyte: 49.2 %
WBC: 5.3 10*3/uL (ref 3.8–10.8)

## 2020-05-02 LAB — TSH: TSH: 1.67 mIU/L (ref 0.40–4.50)

## 2020-05-02 LAB — LIPID PANEL
Cholesterol: 194 mg/dL (ref <200)
HDL: 62 mg/dL (ref >=50)
LDL Cholesterol (Calc): 115 mg/dL (calc) — ABNORMAL HIGH
Non-HDL Cholesterol (Calc): 132 mg/dL (calc) — ABNORMAL HIGH (ref <130)
Total CHOL/HDL Ratio: 3.1 (calc) (ref <5.0)
Triglycerides: 77 mg/dL (ref <150)

## 2020-05-02 LAB — HEMOGLOBIN A1C
Hgb A1c MFr Bld: 6.5 % of total Hgb — ABNORMAL HIGH (ref <5.7)
Mean Plasma Glucose: 140 mg/dL
eAG (mmol/L): 7.7 mmol/L

## 2020-05-05 ENCOUNTER — Encounter: Payer: Medicare Other | Admitting: Internal Medicine

## 2020-05-13 DIAGNOSIS — R7303 Prediabetes: Secondary | ICD-10-CM | POA: Diagnosis not present

## 2020-05-13 DIAGNOSIS — H04123 Dry eye syndrome of bilateral lacrimal glands: Secondary | ICD-10-CM | POA: Diagnosis not present

## 2020-06-08 ENCOUNTER — Ambulatory Visit (INDEPENDENT_AMBULATORY_CARE_PROVIDER_SITE_OTHER): Payer: Medicare Other | Admitting: Internal Medicine

## 2020-06-08 ENCOUNTER — Other Ambulatory Visit: Payer: Self-pay

## 2020-06-08 ENCOUNTER — Encounter: Payer: Self-pay | Admitting: Internal Medicine

## 2020-06-08 VITALS — BP 130/80 | HR 71 | Ht 63.0 in | Wt 169.0 lb

## 2020-06-08 DIAGNOSIS — K219 Gastro-esophageal reflux disease without esophagitis: Secondary | ICD-10-CM

## 2020-06-08 DIAGNOSIS — I10 Essential (primary) hypertension: Secondary | ICD-10-CM | POA: Diagnosis not present

## 2020-06-08 DIAGNOSIS — E785 Hyperlipidemia, unspecified: Secondary | ICD-10-CM | POA: Diagnosis not present

## 2020-06-08 DIAGNOSIS — G8929 Other chronic pain: Secondary | ICD-10-CM | POA: Diagnosis not present

## 2020-06-08 DIAGNOSIS — E119 Type 2 diabetes mellitus without complications: Secondary | ICD-10-CM

## 2020-06-08 DIAGNOSIS — R609 Edema, unspecified: Secondary | ICD-10-CM

## 2020-06-08 DIAGNOSIS — M25561 Pain in right knee: Secondary | ICD-10-CM

## 2020-06-08 DIAGNOSIS — E1169 Type 2 diabetes mellitus with other specified complication: Secondary | ICD-10-CM | POA: Diagnosis not present

## 2020-06-08 DIAGNOSIS — Z86010 Personal history of colonic polyps: Secondary | ICD-10-CM

## 2020-06-08 DIAGNOSIS — Z0000 Encounter for general adult medical examination without abnormal findings: Secondary | ICD-10-CM | POA: Diagnosis not present

## 2020-06-08 DIAGNOSIS — Z6829 Body mass index (BMI) 29.0-29.9, adult: Secondary | ICD-10-CM

## 2020-06-08 DIAGNOSIS — M170 Bilateral primary osteoarthritis of knee: Secondary | ICD-10-CM

## 2020-06-08 DIAGNOSIS — M25562 Pain in left knee: Secondary | ICD-10-CM | POA: Diagnosis not present

## 2020-06-08 LAB — POCT URINALYSIS DIPSTICK
Appearance: NEGATIVE
Bilirubin, UA: NEGATIVE
Blood, UA: NEGATIVE
Glucose, UA: NEGATIVE
Ketones, UA: NEGATIVE
Leukocytes, UA: NEGATIVE
Nitrite, UA: NEGATIVE
Odor: NEGATIVE
Protein, UA: NEGATIVE
Spec Grav, UA: 1.01 (ref 1.010–1.025)
Urobilinogen, UA: 0.2 E.U./dL
pH, UA: 6.5 (ref 5.0–8.0)

## 2020-06-08 NOTE — Patient Instructions (Addendum)
It was a pleasure to see you today. Referral will be made to orthopedist regarding knee pain. Defer flu vaccine this year. Continue current medications and follow-up in 6 months.

## 2020-06-08 NOTE — Progress Notes (Signed)
Subjective:    Patient ID: Kathleen Greer, female    DOB: Apr 17, 1946, 75 y.o.   MRN: [id number]  HPI  75 year old  Black Female for Medicare wellness, health maintenance exam and evaluation of medical issues. She is raising her 84 year old great-grandson. His father is patient's grandson and he is currently in prison. The mother of her Ivor Costa has had drug abuse issues.  Patient has a history of GE reflux, controlled type 2 diabetes mellitus, dependent edema, hypokalemia.  History of scoliosis of the lumbar spine.   Hypokalemia secondary to diuretic therapy.  History of anxiety which is improved over the past few years.  History of positional vertigo.  Had colonoscopy Aug 04, 2013 which was normal with 10-year follow-up recommended. There is a remote history of adenomatous polyps.  Hysterectomy without oophorectomy 1971.  Right rotator cuff surgery June 2018. Right carpal tunnel release Aug 04, 2001.  Penicillin causes a rash.  Social history: Patient is retired and formerly worked at [employer] as a Location manager. She does not smoke or consume alcohol.  History of GE reflux treated with omeprazole.  History of hypokalemia due to diuretic therapy potassium is normal on potassium supplement.  History of anxiety state about her health and situational stress but does not take medication for this.  Family history: Adult son with hypertension. Patient's father died with an MI. Mother died of an MI. 1 brother died with heart failure. Another brother died in 08/04/2004 with cirrhosis of the liver. Sister died at age 44. Another sister whose health is apparently unchanged.   New complaint today is knee pain. This is bilateral. Likely has osteoarthritis. Will refer to orthopedist for evaluation.     Review of Systems had blister on left great toe. Patient had I and D at an urgent care. This has healed in a home. We do not have a copy of those records from the urgent care.     Objective:    Physical Exam  Blood pressure 130/80 pulse 71 pulse oximetry 98% weight 169 pounds height 5 feet 3 inches BMI 29.94  Skin: Warm and dry. Nodes none. TMs clear. Neck is supple. No JVD thyromegaly or carotid bruits. Chest is clear to auscultation. Cardiac exam: Regular rate and rhythm. No murmurs or gallops appreciated. Breasts without masses. Abdomen is soft nondistended without hepatosplenomegaly masses or tenderness. No lower extremity pitting edema. Neuro: Intact without focal deficits. Affect, thought, and judgment are normal .                  Assessment & Plan:  Type 2 diabetes mellitus treated with Metformin  Hyperlipidemia treated with Crestor 5 mg once a week  Dependent edema treated with Demadex 1 or 2 tabs daily  Hypertension treated with losartan and amlodipine and stable  History of hypokalemia on diuretic treated with potassium supplement 20 mEq twice daily  GE reflux treated with omeprazole 20 mg daily  History of adenomatous colon polyps due for repeat colonoscopy in 08/05/2023  Health maintenance: Has had 3 COVID-19 vaccines. Had mammogram in September 2021.  Plan: Continue current medications and follow-up in 6 to 12 months or as needed. No change in medications. Her hemoglobin A1c is stable at 6.5% and has been in the 6.2-6.5 range for the past 4 years. LDL mildly elevated at 115 on statin medication. TSH is normal. Creatinine is stable. CBC is within normal limits.  Subjective:   Patient presents for Medicare Annual/Subsequent preventive examination.  Review Past Medical/Family/Social: See above  Risk Factors  Current exercise habits: Housework and yard work Dietary issues discussed: Low-fat low carbohydrate  Cardiac risk factors: Hyperlipidemia and impaired glucose tolerance  Depression Screen  (Note: if answer to either of the following is "Yes", a more complete depression screening is indicated)   Over the past two weeks, have you felt down, depressed or  hopeless? No  Over the past two weeks, have you felt little interest or pleasure in doing things? No Have you lost interest or pleasure in daily life? No Do you often feel hopeless? No Do you cry easily over simple problems? No   Activities of Daily Living  In your present state of health, do you have any difficulty performing the following activities?:   Driving? Sometimes no sleeping well at night driving Managing money? No  Feeding yourself? No  Getting from bed to chair? No  Climbing a flight of stairs? No  Preparing food and eating?: No  Bathing or showering? No  Getting dressed: No  Getting to the toilet? No  Using the toilet:No  Moving around from place to place: Sometimes if I sit too long In the past year have you fallen or had a near fall?:No  Are you sexually active? yes Do you have more than one partner? No   Hearing Difficulties: No  Do you often ask people to speak up or repeat themselves? Yes Do you experience ringing or noises in your ears? No  Do you have difficulty understanding soft or whispered voices? Yes Do you feel that you have a problem with memory? Sometimes Do you often misplace items? Yes   Home Safety:  Do you have a smoke alarm at your residence? Yes Do you have grab bars in the bathroom? None Do you have throw rugs in your house? Yes   Cognitive Testing  Alert? Yes Normal Appearance?Yes  Oriented to person? Yes Place? Yes  Time? Yes  Recall of three objects? Yes  Can perform simple calculations? Yes  Displays appropriate judgment?Yes  Can read the correct time from a watch face?Yes   List the Names of Other Physician/Practitioners you currently use:  See referral list for the physicians patient is currently seeing.     Review of Systems: See above   Objective:     General appearance: Appears stated age and in no acute distress Head: Normocephalic, without obvious abnormality, atraumatic  Eyes: conj clear, EOMi PEERLA  Ears:  normal TM's and external ear canals both ears  Nose: Nares normal. Septum midline. Mucosa normal. No drainage or sinus tenderness.  Throat: lips, mucosa, and tongue normal; teeth and gums normal  Neck: no adenopathy, no carotid bruit, no JVD, supple, symmetrical, trachea midline and thyroid not enlarged, symmetric, no tenderness/mass/nodules  No CVA tenderness.  Lungs: clear to auscultation bilaterally  Breasts: normal appearance, no masses or tenderness Heart: regular rate and rhythm, S1, S2 normal, no murmur, click, rub or gallop  Abdomen: soft, non-tender; bowel sounds normal; no masses, no organomegaly  Musculoskeletal: ROM normal in all joints, no crepitus, no deformity, Normal muscle strengthen. Back  is symmetric, no curvature. Skin: Skin color, texture, turgor normal. No rashes or lesions  Lymph nodes: Cervical, supraclavicular, and axillary nodes normal.  Neurologic: CN 2 -12 Normal, Normal symmetric reflexes. Normal coordination and gait  Psych: Alert & Oriented x 3, Mood appear stable.    Assessment:    Annual wellness medicare exam   Plan:    During the course of the visit the patient was  educated and counseled about appropriate screening and preventive services including:   Covid 19 vaccines up-to-date  Mammogram up-to-date  Colonoscopy up-to-date     Patient Instructions (the written plan) was given to the patient.  Medicare Attestation  I have personally reviewed:  The patient's medical and social history  Their use of alcohol, tobacco or illicit drugs  Their current medications and supplements  The patient's functional ability including ADLs,fall risks, home safety risks, cognitive, and hearing and visual impairment  Diet and physical activities  Evidence for depression or mood disorders  The patient's weight, height, BMI, and visual acuity have been recorded in the chart. I have made referrals, counseling, and provided education to the patient based on  review of the above and I have provided the patient with a written personalized care plan for preventive services.

## 2020-06-12 ENCOUNTER — Other Ambulatory Visit: Payer: Self-pay

## 2020-06-12 MED ORDER — TORSEMIDE 20 MG PO TABS: ORAL_TABLET | ORAL | 3 refills | Status: DC

## 2020-06-12 MED ORDER — OMEPRAZOLE 20 MG PO CPDR: 20.0000 mg | DELAYED_RELEASE_CAPSULE | Freq: Every day | ORAL | 3 refills | Status: DC

## 2020-06-12 MED ORDER — ROSUVASTATIN CALCIUM 5 MG PO TABS: 5.0000 mg | ORAL_TABLET | ORAL | 2 refills | Status: DC

## 2020-06-12 MED ORDER — POTASSIUM CHLORIDE CRYS ER 20 MEQ PO TBCR: 20.0000 meq | EXTENDED_RELEASE_TABLET | Freq: Two times a day (BID) | ORAL | 3 refills | Status: DC

## 2020-06-17 ENCOUNTER — Ambulatory Visit: Payer: Self-pay

## 2020-06-17 ENCOUNTER — Ambulatory Visit: Payer: Medicare Other | Admitting: Orthopaedic Surgery

## 2020-06-17 ENCOUNTER — Encounter: Payer: Self-pay | Admitting: Orthopaedic Surgery

## 2020-06-17 VITALS — Ht 63.0 in | Wt 169.0 lb

## 2020-06-17 DIAGNOSIS — M25562 Pain in left knee: Secondary | ICD-10-CM

## 2020-06-17 DIAGNOSIS — M1712 Unilateral primary osteoarthritis, left knee: Secondary | ICD-10-CM

## 2020-06-17 DIAGNOSIS — M25561 Pain in right knee: Secondary | ICD-10-CM | POA: Diagnosis not present

## 2020-06-17 DIAGNOSIS — G8929 Other chronic pain: Secondary | ICD-10-CM | POA: Diagnosis not present

## 2020-06-17 DIAGNOSIS — M1711 Unilateral primary osteoarthritis, right knee: Secondary | ICD-10-CM | POA: Insufficient documentation

## 2020-06-17 MED ORDER — METHYLPREDNISOLONE ACETATE 40 MG/ML IJ SUSP
40.0000 mg | INTRAMUSCULAR | Status: AC | PRN
  Administered 2020-06-17: 40 mg via INTRA_ARTICULAR

## 2020-06-17 MED ORDER — LIDOCAINE HCL 1 % IJ SOLN
3.0000 mL | INTRAMUSCULAR | Status: AC | PRN
  Administered 2020-06-17: 3 mL

## 2020-06-17 NOTE — Progress Notes (Signed)
Office Visit Note   Patient: Kathleen Greer           Date of Birth: 12-Aug-1945           MRN: [id number] Visit Date: 06/17/2020              Requested by: Margaree Mackintosh, MD [address] [place],  [place] [address] PCP: Margaree Mackintosh, MD   Assessment & Plan: Visit Diagnoses:  1. Chronic pain of left knee   2. Chronic pain of right knee   3. Unilateral primary osteoarthritis, left knee   4. Unilateral primary osteoarthritis, right knee     Plan: Since this is the first time I am seeing this patient, I am recommending conservative treatment for her knee osteoarthritis with both knees.  I did recommend steroid injections in both knees today and counseled her about how this can increase her blood glucose.  She states she is willing to try anything.  Also gave her a generic prescription for outpatient physical therapy.  She will look up any type of physical therapy office near where she lives and they can work on strengthening her quad muscles and any modalities to decrease her knee pain.  All questions and concerns were answered and addressed.  I did show her knee replacement model we talked about that possibility in the future.  I will see her back in 3 months to see how she is doing overall.  Follow-Up Instructions: Return in about 3 months (around 09/17/2020).   Orders:  Orders Placed This Encounter  Procedures  . Large Joint Inj  . Large Joint Inj  . XR Knee 1-2 Views Left  . XR Knee 1-2 Views Right   No orders of the defined types were placed in this encounter.     Procedures: Large Joint Inj: R knee on 06/17/2020 8:34 AM Indications: diagnostic evaluation and pain Details: 22 G 1.5 in needle, superolateral approach  Arthrogram: No  Medications: 3 mL lidocaine 1 %; 40 mg methylPREDNISolone acetate 40 MG/ML Outcome: tolerated well, no immediate complications Procedure, treatment alternatives, risks and benefits explained, specific risks discussed. Consent was given  by the patient. Immediately prior to procedure a time out was called to verify the correct patient, procedure, equipment, support staff and site/side marked as required. Patient was prepped and draped in the usual sterile fashion.   Large Joint Inj: L knee on 06/17/2020 8:35 AM Indications: diagnostic evaluation and pain Details: 22 G 1.5 in needle, superolateral approach  Arthrogram: No  Medications: 3 mL lidocaine 1 %; 40 mg methylPREDNISolone acetate 40 MG/ML Outcome: tolerated well, no immediate complications Procedure, treatment alternatives, risks and benefits explained, specific risks discussed. Consent was given by the patient. Immediately prior to procedure a time out was called to verify the correct patient, procedure, equipment, support staff and site/side marked as required. Patient was prepped and draped in the usual sterile fashion.       Clinical Data: No additional findings.   Subjective: Chief Complaint  Patient presents with  . Right Knee - Pain  . Left Knee - Pain  The patient is a very pleasant 75 year old female who comes in for evaluation and treatment of bilateral knee pain and been hurting for many years now. She had remote arthroscopic surgery on one of the knees many years ago. It has been at least over 10 years or more since she is had any type of injection in her knees. Her knees do hurt on a daily  basis. She says is painful to walk. It does wake her up at night. Even small distances or going up and down stairs hurt both knees. At this point her knee pain is detrimentally affecting her mobility, her quality of life and her actives daily living. She is diabetic but has good diabetic control with hemoglobin A1c of always below 7. She denies any specific or significant injuries to the knees. Her pain is significant. She does not walk with assist device.  HPI  Review of Systems There is currently no listed headache, chest pain, shortness of breath, fever, chills,  nausea, vomiting  Objective: Vital Signs: Ht 5\' 3"  (1.6 m)   Wt 169 lb (76.7 kg)   BMI 29.94 kg/m   Physical Exam She is alert and orient x3 and in no acute distress Ortho Exam Examination of both knees shows slight varus malalignment. Neither knee has an effusion. Both knees have global tenderness throughout the arc of motion but range of motion is full. Both knees have significant patellofemoral crepitus Specialty Comments:  No specialty comments available.  Imaging: XR Knee 1-2 Views Left  Result Date: 06/17/2020 2 views of the left knee show severe tricompartment arthritis with varus malalignment, near bone-on-bone wear of the medial compartment and patellofemoral narrowing.  XR Knee 1-2 Views Right  Result Date: 06/17/2020 2 views of the right knee show severe tricompartment arthritis with varus malalignment, significant narrowing of the patellofemoral joint and the medial compartment of the knee. There are also periarticular osteophytes.    PMFS History: Patient Active Problem List   Diagnosis Date Noted  . Unilateral primary osteoarthritis, left knee 06/17/2020  . Unilateral primary osteoarthritis, right knee 06/17/2020  . Osteoarthritis of right knee 09/15/2017  . Epistaxis 07/12/2016  . Impaired glucose tolerance 09/04/2015  . Metabolic syndrome 08/08/2014  . Anxiety 06/02/2011  . Hypertension 01/12/2011  . GE reflux 01/12/2011  . Dependent edema 01/12/2011  . Benign positional vertigo 01/12/2011  . Hx of adenomatous colonic polyps 01/12/2011   Past Medical History:  Diagnosis Date  . Arthritis   . Blood transfusion without reported diagnosis 1970   after hysterectomy  . Dependent edema   . Diabetes (HCC)   . Generalized headaches   . GERD (gastroesophageal reflux disease)   . Hyperkalemia   . Hypertension   . IBS (irritable bowel syndrome)   . Positional vertigo   . Scoliosis     Family History  Problem Relation Age of Onset  . Heart disease Mother    . Stroke Mother   . Hypertension Mother   . Heart disease Father   . Heart disease Brother   . Hypertension Son   . Breast cancer Sister   . Colon cancer Neg Hx     Past Surgical History:  Procedure Laterality Date  . ABDOMINAL HYSTERECTOMY  1970  . CARPAL TUNNEL RELEASE Right 2003  . KNEE ARTHROSCOPY Right 2010  . ROTATOR CUFF REPAIR Right 6/02, 3/03   Social History   Occupational History  . Not on file  Tobacco Use  . Smoking status: Never Smoker  . Smokeless tobacco: Never Used  Substance and Sexual Activity  . Alcohol use: No  . Drug use: No  . Sexual activity: Not on file

## 2020-08-28 ENCOUNTER — Other Ambulatory Visit: Payer: Self-pay | Admitting: Internal Medicine

## 2020-08-28 DIAGNOSIS — R7302 Impaired glucose tolerance (oral): Secondary | ICD-10-CM

## 2020-09-16 ENCOUNTER — Other Ambulatory Visit: Payer: Self-pay

## 2020-09-16 ENCOUNTER — Encounter: Payer: Self-pay | Admitting: Orthopaedic Surgery

## 2020-09-16 ENCOUNTER — Ambulatory Visit (INDEPENDENT_AMBULATORY_CARE_PROVIDER_SITE_OTHER): Payer: Medicare Other | Admitting: Orthopaedic Surgery

## 2020-09-16 DIAGNOSIS — M1711 Unilateral primary osteoarthritis, right knee: Secondary | ICD-10-CM | POA: Diagnosis not present

## 2020-09-16 DIAGNOSIS — G8929 Other chronic pain: Secondary | ICD-10-CM | POA: Diagnosis not present

## 2020-09-16 DIAGNOSIS — M1712 Unilateral primary osteoarthritis, left knee: Secondary | ICD-10-CM | POA: Diagnosis not present

## 2020-09-16 DIAGNOSIS — M25562 Pain in left knee: Secondary | ICD-10-CM | POA: Diagnosis not present

## 2020-09-16 DIAGNOSIS — M25561 Pain in right knee: Secondary | ICD-10-CM

## 2020-09-16 NOTE — Progress Notes (Signed)
The patient is a very active and pleasant 75 year old female that I seen before.  She has known severe arthritis in both her knees.  Both hurt the same.  She has tried activity modification and anti-inflammatories.  She is worked on Dance movement psychotherapist exercises as well.  We have placed injections in both knees of steroid.  Those helped only for the short-term.  At this point her knee pain is definitely affecting her mobility, her quality of life and her actives daily living.  She is thinking about proceeding with knee replacement surgery.  She does take care of her 3-year-old grandson as well.  She has had no other acute change in her medical status.  She does see Dr. Lenord Fellers soon again for a follow-up appointment.  At this point she is considering knee replacement surgery and wants to think about this further.  She currently denies any headache, chest pain, shortness of breath, fever, chills, nausea, vomiting  Both knees have slight varus malalignment and a mild effusion.  Both knees have significant medial and lateral joint line tenderness as well as patellofemoral crepitation throughout the arc of motion.  Both knees are ligamentously stable.  X-rays of both knees earlier this year show tricompartment arthritis with varus malalignment and osteophytes in all 3 compartments as well as significant medial joint space narrowing bilaterally.  At this point, I talked to her again in length in detail about knee replacement surgery.  I gave her my surgery scheduler's card.  She will let us know if she decides to proceed with this type of surgery in which knee she would like to have done first.  All questions and concerns were answered addressed.  She said that she would like to give [place] a call after she thinks about it a little bit more.

## 2020-10-08 ENCOUNTER — Other Ambulatory Visit: Payer: Self-pay | Admitting: Internal Medicine

## 2020-11-06 ENCOUNTER — Other Ambulatory Visit: Payer: Self-pay

## 2020-11-06 ENCOUNTER — Ambulatory Visit: Payer: Medicare Other | Admitting: Sports Medicine

## 2020-11-06 ENCOUNTER — Encounter: Payer: Self-pay | Admitting: Sports Medicine

## 2020-11-06 DIAGNOSIS — M19072 Primary osteoarthritis, left ankle and foot: Secondary | ICD-10-CM

## 2020-11-06 DIAGNOSIS — M79675 Pain in left toe(s): Secondary | ICD-10-CM

## 2020-11-06 DIAGNOSIS — M792 Neuralgia and neuritis, unspecified: Secondary | ICD-10-CM

## 2020-11-06 DIAGNOSIS — E119 Type 2 diabetes mellitus without complications: Secondary | ICD-10-CM

## 2020-11-06 MED ORDER — TRIAMCINOLONE ACETONIDE 10 MG/ML IJ SUSP
10.0000 mg | Freq: Once | INTRAMUSCULAR | Status: AC
  Administered 2020-11-06: 10 mg

## 2020-11-06 NOTE — Progress Notes (Signed)
Subjective: Kathleen Greer is a 75 y.o. female patient returns to office today for numbness to the bottom of the first and now second toe on the left states that she has some shooting pains along the lateral aspect of the first toe that is sometimes bothersome and is wondering if there is still an issue with her toenail states that back in December she had an issue where some infection occurred around her nail bed at the cuticle area across the entire bottom of the left hallux nail but that resolved with her PCP giving her some antibiotics.  Patient denies any significant redness warmth swelling drainage.  Denies constitutional symptoms at this time.  States that she never tried the gabapentin for nerve issues from last year as I recommended.  Patient Active Problem List   Diagnosis Date Noted   Unilateral primary osteoarthritis, left knee 06/17/2020   Unilateral primary osteoarthritis, right knee 06/17/2020   Osteoarthritis of right knee 09/15/2017   Epistaxis 07/12/2016   Impaired glucose tolerance 38/18/4037   Metabolic syndrome 54/36/0677   Anxiety 06/02/2011   Hypertension 01/12/2011   GE reflux 01/12/2011   Dependent edema 01/12/2011   Benign positional vertigo 01/12/2011   Hx of adenomatous colonic polyps 01/12/2011    Current Outpatient Medications on File Prior to Visit  Medication Sig Dispense Refill   Alpha-Lipoic Acid 600 MG CAPS Take by mouth.     amLODipine (NORVASC) 10 MG tablet TAKE 1 TABLET BY MOUTH  DAILY 90 tablet 3   aspirin 81 MG tablet Take 81 mg by mouth daily.     Blood Glucose Monitoring Suppl (ONE TOUCH ULTRA 2) w/Device KIT      Cholecalciferol (VITAMIN D) 1000 UNITS capsule Take 2,000 Units by mouth daily.     gabapentin (NEURONTIN) 300 MG capsule Take 1 capsule (300 mg total) by mouth at bedtime. 90 capsule 3   losartan (COZAAR) 100 MG tablet TAKE 1 TABLET BY MOUTH  DAILY 90 tablet 3   metFORMIN (GLUCOPHAGE) 1000 MG tablet TAKE 1 TABLET BY MOUTH  DAILY WITH  BREAKFAST 90 tablet 3   neomycin-polymyxin-hydrocortisone (CORTISPORIN) OTIC solution Apply 2-3 drops to the ingrown toenail site twice daily. Cover with band-aid. 10 mL 0   omeprazole (PRILOSEC) 20 MG capsule Take 1 capsule (20 mg total) by mouth daily. 90 capsule 3   ONE TOUCH ULTRA TEST test strip      ONETOUCH DELICA LANCETS FINE MISC      potassium chloride SA (KLOR-CON) 20 MEQ tablet Take 1 tablet (20 mEq total) by mouth 2 (two) times daily. 180 tablet 3   rosuvastatin (CRESTOR) 5 MG tablet TAKE 1 TABLET BY MOUTH ONCE A WEEK 13 tablet 3   torsemide (DEMADEX) 20 MG tablet TAKE ONE OR TWO tabs daily 180 tablet 3   [DISCONTINUED] potassium chloride (KLOR-CON) 20 MEQ packet Take 20 mEq by mouth daily. 30 packet 11   No current facility-administered medications on file prior to visit.    Allergies  Allergen Reactions   Penicillin G Other (See Comments)    Other   Penicillins     REACTION: Skin irritation    Objective:  General: Well developed, nourished, in no acute distress, alert and oriented x3   Dermatology: Skin is warm, dry and supple bilateral. Left hallux lateral nail bed is healed and there is no acute ingrowing noted there is a small trauma line noted in her nailbed but there is no surrounding signs of infection.   Neurovascular status: Intact.  No lower extremity swelling; No pain with calf compression bilateral.  Subjective numbness to the ball of the left foot worse at the first and second toes plantar aspect with occasional sharp shooting pain radiating to the first toe.  Musculoskeletal: No tenderness to the left hallux nail fold.  There is no tenderness to palpation to the left forefoot except patient feeling like her plantar toes are numb left first and second.  Mild bunion deformity.  Muscular strength within normal limits bilateral.   Assesement and Plan: Problem List Items Addressed This Visit   None Visit Diagnoses     Neuritis    -  Primary   Toe pain, left        Arthritis of foot, left       Diabetes mellitus without complication ([place])           -Examined patient  -Advised patient that there is no acute concern for reappearance of ingrown nail or reason for me to do another nail procedure at this time -Advised patient that likely most of her pain is related to her nerves especially since she is complaining of numbness and an awkward sensation to the bottom of the toes likely related to small fiber neuropathy versus neuritis to try to offer patient symptomatic relief hydrated perform a localized nerve block with steroid at the first webspace on the left foot using 1 cc of Marcaine and prilocaine and 0.5% dexamethasone and Kenalog 10.  Patient tolerated the injection well without complication.  Advised patient to ice for 10 minutes this evening to prevent breathing -Advised patient to get over-the-counter Nervive nerve supplement to take as directed to see if this will help resolve her nerve pain -Advised patient that her Ortho orthopedic problems and underlying diabetes could be contributing to this pain related to nerves -Patient is to return as needed or sooner if problems arise.  Landis Martins, DPM

## 2020-11-06 NOTE — Patient Instructions (Signed)
Nervive nerve relief supplement for your nerves can be purchased OTC at [facility]  

## 2020-11-30 ENCOUNTER — Telehealth: Payer: Self-pay

## 2020-11-30 NOTE — Telephone Encounter (Signed)
I called patient to discuss TKA for September.  She is requesting renewed handicapped placard.  Thanks!

## 2020-11-30 NOTE — Telephone Encounter (Signed)
Is this ok?

## 2020-11-30 NOTE — Telephone Encounter (Signed)
Placed at the front desk..the patient was informed

## 2020-11-30 NOTE — Telephone Encounter (Signed)
Noted; form completed.

## 2020-12-02 IMAGING — MG MM DIGITAL SCREENING BILAT W/ TOMO W/ CAD
6 of 10 series · 6 of 30 positions shown · non-contrast
Comparison: Previous exam(s).

CLINICAL DATA: Screening.

EXAM:
DIGITAL SCREENING BILATERAL MAMMOGRAM WITH TOMO AND CAD

[L CC synth-2D]
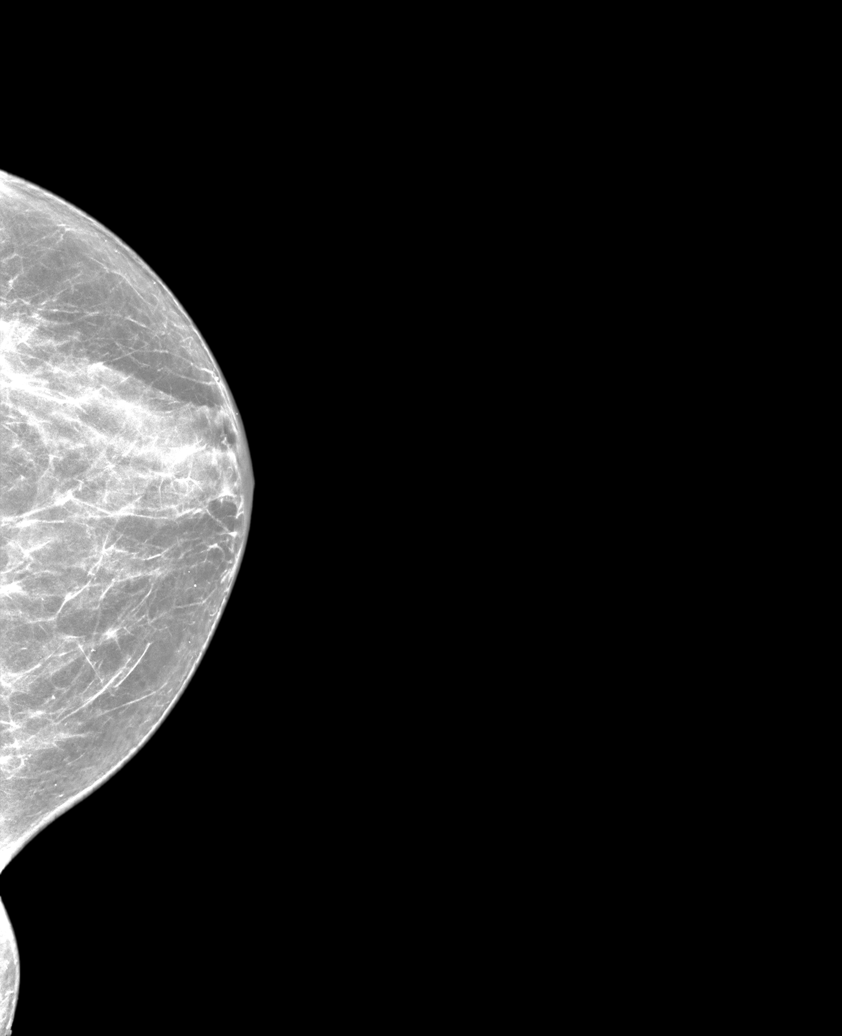

[R CC synth-2D (1 of 2)]
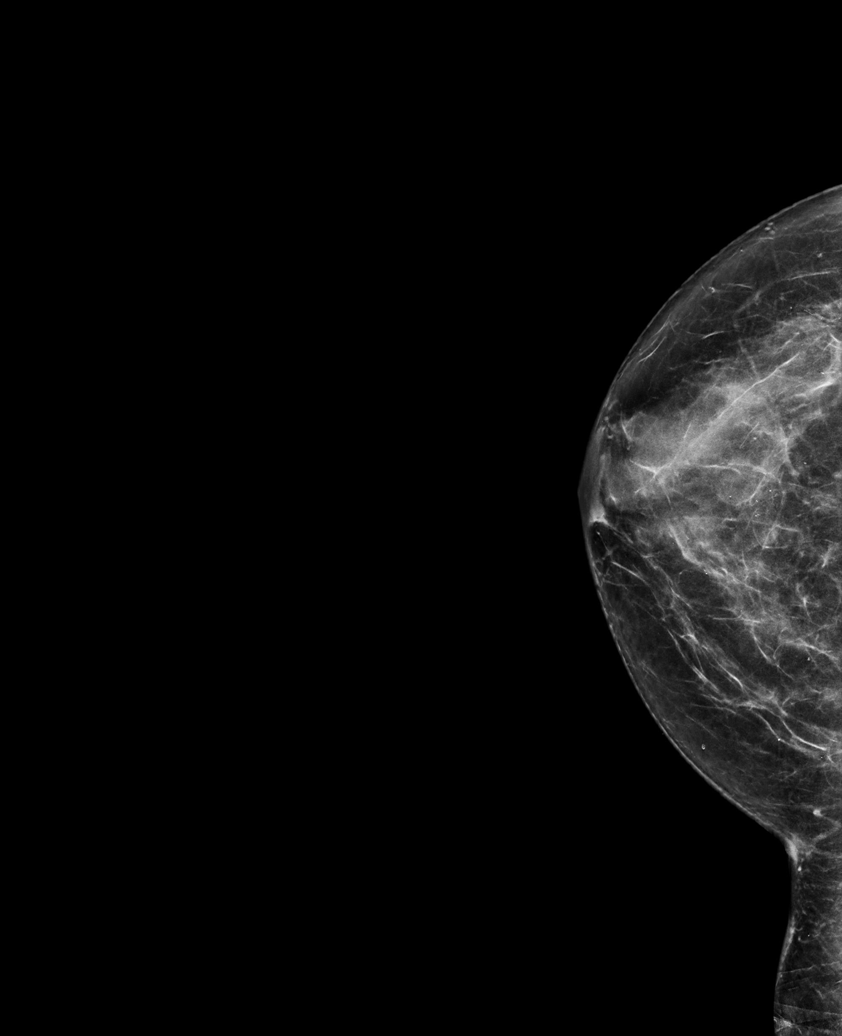

[R CC synth-2D (2 of 2)]
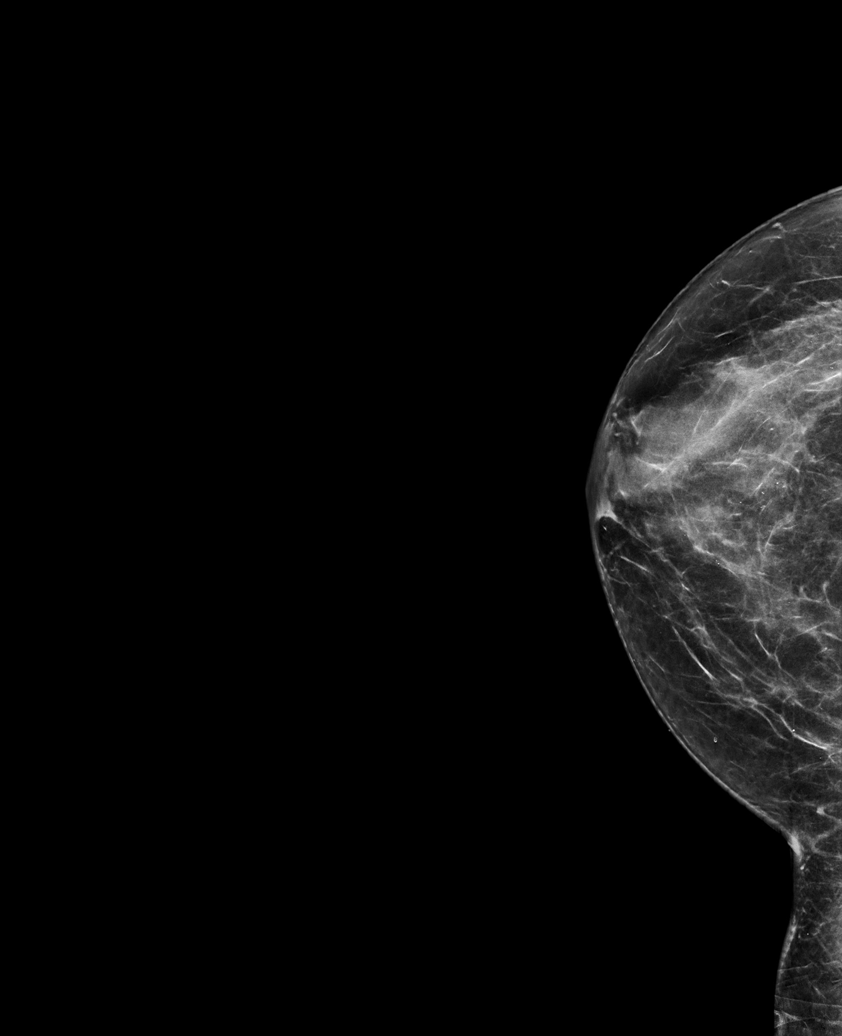

[L MLO synth-2D]
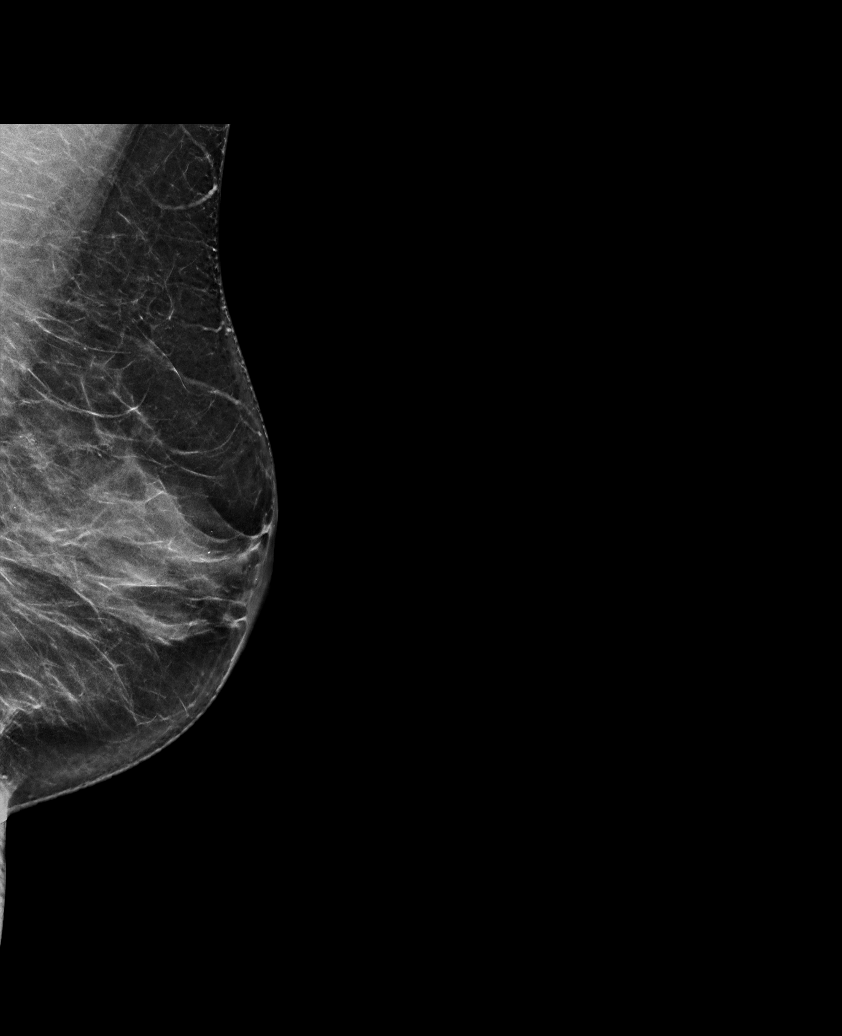

[R MLO synth-2D]
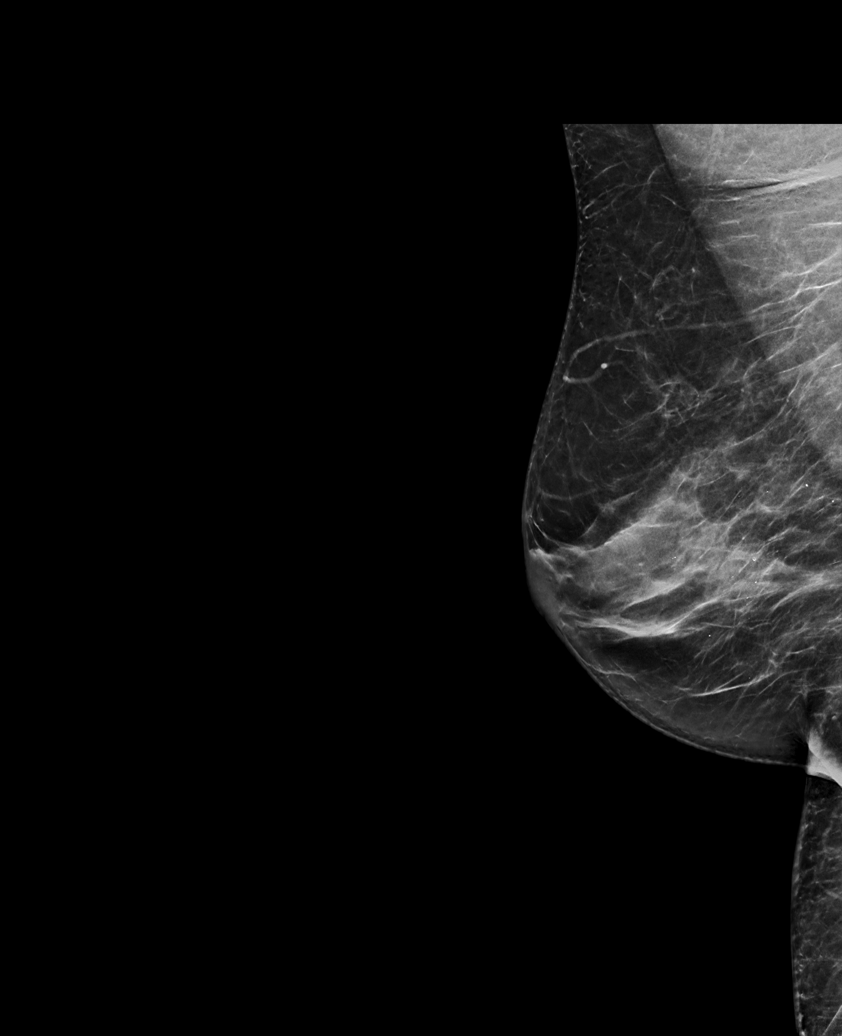

[R CC tomo · tomo slice 33/65.0]
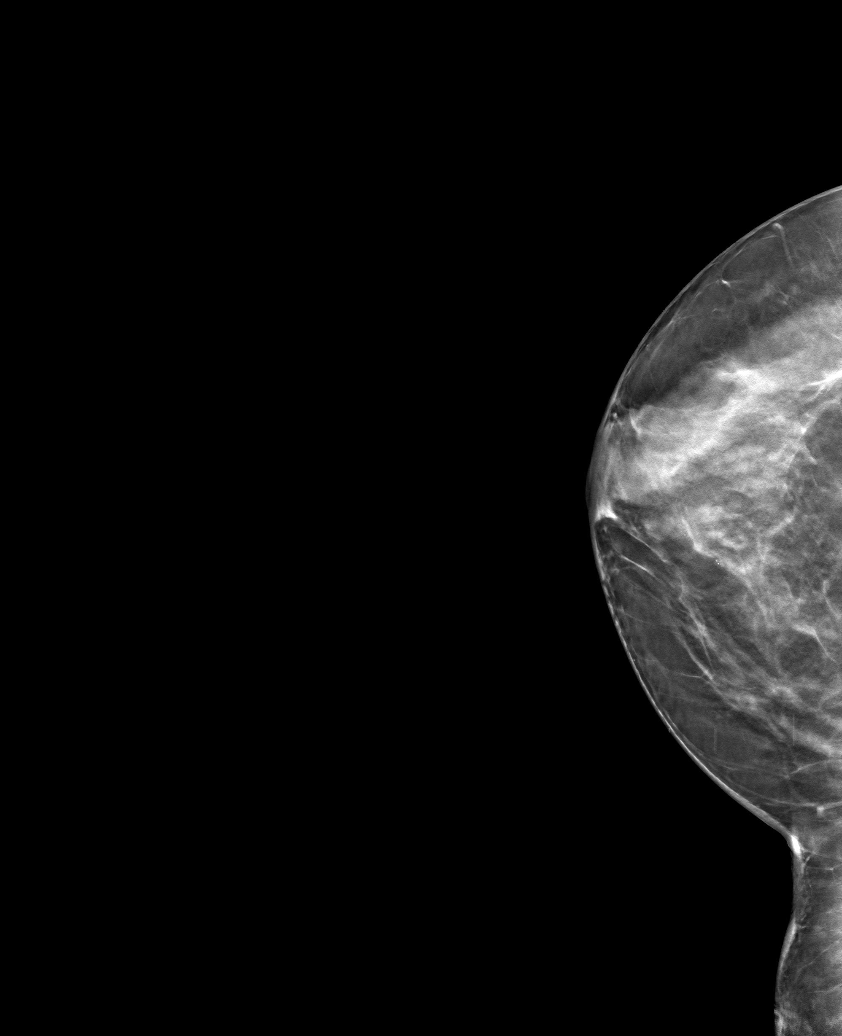

[6 of 30 positions shown; findings below may reference images not displayed]

ACR Breast Density Category c: The breast tissue is heterogeneously
dense, which may obscure small masses.
FINDINGS: There are no findings suspicious for malignancy. Images were
processed with CAD.
IMPRESSION: No mammographic evidence of malignancy. A result letter of this
screening mammogram will be mailed directly to the patient.

RECOMMENDATION:
Screening mammogram in one year. (Code:[id number])

BI-RADS CATEGORY  1: Negative.

## 2020-12-04 ENCOUNTER — Other Ambulatory Visit: Payer: Medicare Other | Admitting: Internal Medicine

## 2020-12-04 ENCOUNTER — Other Ambulatory Visit: Payer: Self-pay

## 2020-12-04 ENCOUNTER — Telehealth: Payer: Self-pay | Admitting: Internal Medicine

## 2020-12-04 DIAGNOSIS — E785 Hyperlipidemia, unspecified: Secondary | ICD-10-CM

## 2020-12-04 DIAGNOSIS — E1169 Type 2 diabetes mellitus with other specified complication: Secondary | ICD-10-CM | POA: Diagnosis not present

## 2020-12-04 DIAGNOSIS — E119 Type 2 diabetes mellitus without complications: Secondary | ICD-10-CM | POA: Diagnosis not present

## 2020-12-04 DIAGNOSIS — I10 Essential (primary) hypertension: Secondary | ICD-10-CM

## 2020-12-04 NOTE — Telephone Encounter (Signed)
Kathleen Greer [phone]  When [name] came in for her labs this morning she told me that she had gone to the dentist yesterday and they would not do her dental work because she had high blood pressure while she was there.  R Arm 11:03 am 191/107  HR 69 R Arm 11:05 am 185/103  HR 69 R Arm 11:15 am 171/86    HR 112 L Arm  11:25 am 186/90    HR 124 L Arm  11:28 am 202/103 HR 66  She said she took it when she got home and it was not like that, and she took it this morning and it was a little high but not like that. I told her she should take it at least 3 time each day between now and Monday when her appointment is, so we could have a record of what it is running.

## 2020-12-05 LAB — HEMOGLOBIN A1C
Hgb A1c MFr Bld: 6.7 % of total Hgb — ABNORMAL HIGH (ref <5.7)
Mean Plasma Glucose: 146 mg/dL
eAG (mmol/L): 8.1 mmol/L

## 2020-12-05 LAB — HEPATIC FUNCTION PANEL
AG Ratio: 1.5 (calc) (ref 1.0–2.5)
ALT: 26 U/L (ref 6–29)
AST: 20 U/L (ref 10–35)
Albumin: 4.7 g/dL (ref 3.6–5.1)
Alkaline phosphatase (APISO): 95 U/L (ref 37–153)
Bilirubin, Direct: 0.2 mg/dL (ref 0.0–0.2)
Globulin: 3.1 g/dL (calc) (ref 1.9–3.7)
Indirect Bilirubin: 0.4 mg/dL (calc) (ref 0.2–1.2)
Total Bilirubin: 0.6 mg/dL (ref 0.2–1.2)
Total Protein: 7.8 g/dL (ref 6.1–8.1)

## 2020-12-05 LAB — LIPID PANEL
Cholesterol: 214 mg/dL — ABNORMAL HIGH (ref <200)
HDL: 75 mg/dL (ref >=50)
LDL Cholesterol (Calc): 120 mg/dL (calc) — ABNORMAL HIGH
Non-HDL Cholesterol (Calc): 139 mg/dL (calc) — ABNORMAL HIGH (ref <130)
Total CHOL/HDL Ratio: 2.9 (calc) (ref <5.0)
Triglycerides: 89 mg/dL (ref <150)

## 2020-12-07 ENCOUNTER — Encounter: Payer: Self-pay | Admitting: Internal Medicine

## 2020-12-07 ENCOUNTER — Ambulatory Visit (INDEPENDENT_AMBULATORY_CARE_PROVIDER_SITE_OTHER): Payer: Medicare Other | Admitting: Internal Medicine

## 2020-12-07 ENCOUNTER — Other Ambulatory Visit: Payer: Self-pay

## 2020-12-07 VITALS — BP 140/90 | HR 84 | Ht 63.0 in | Wt 170.0 lb

## 2020-12-07 DIAGNOSIS — I10 Essential (primary) hypertension: Secondary | ICD-10-CM

## 2020-12-07 DIAGNOSIS — E119 Type 2 diabetes mellitus without complications: Secondary | ICD-10-CM

## 2020-12-07 DIAGNOSIS — E785 Hyperlipidemia, unspecified: Secondary | ICD-10-CM | POA: Diagnosis not present

## 2020-12-07 DIAGNOSIS — E1169 Type 2 diabetes mellitus with other specified complication: Secondary | ICD-10-CM | POA: Diagnosis not present

## 2020-12-07 DIAGNOSIS — M170 Bilateral primary osteoarthritis of knee: Secondary | ICD-10-CM

## 2020-12-07 DIAGNOSIS — F439 Reaction to severe stress, unspecified: Secondary | ICD-10-CM | POA: Diagnosis not present

## 2020-12-07 MED ORDER — GLIPIZIDE 5 MG PO TABS: ORAL_TABLET | ORAL | 3 refills | Status: DC

## 2020-12-07 MED ORDER — ROSUVASTATIN CALCIUM 5 MG PO TABS: 5.0000 mg | ORAL_TABLET | Freq: Every day | ORAL | 3 refills | Status: DC
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
## 2020-12-07 NOTE — Patient Instructions (Addendum)
Keep an eye on BP for several days and call if persistently elevated.  Take glipizide to 5 mg twice a day. Increase Crestor to 5 mg daily.  Take metformin 1000 mg once daily.  RTC in 6 weeks for labs and follow-up office visit.

## 2020-12-16 ENCOUNTER — Other Ambulatory Visit: Payer: Self-pay | Admitting: Physician Assistant

## 2020-12-24 NOTE — Progress Notes (Signed)
Surgical Instructions    Your procedure is scheduled on December 28, 2020.  Report to [facility] Main Entrance "A" at 10:00 A.M., then check in with the Admitting office.  Call this number if you have problems the morning of surgery:  [phone]   If you have any questions prior to your surgery date call [phone]: Open Monday-Friday 8am-4pm    Remember:  Do not eat after midnight the night before your surgery  You may drink clear liquids until 9:00am the morning of your surgery.   Clear liquids allowed are: Water, Non-Citrus Juices (without pulp), Carbonated Beverages, Clear Tea, Black Coffee Only, and Gatorade   Enhanced Recovery after Surgery for Orthopedics Enhanced Recovery after Surgery is a protocol used to improve the stress on your body and your recovery after surgery.  Patient Instructions  The day of surgery (if you have diabetes):  Drink ONE small 10 oz bottle of water by 9:00 am the morning of surgery This bottle was given to you during your hospital  pre-op appointment visit.  Nothing else to drink after completing the  Small 10 oz bottle of water.         If you have questions, please contact your surgeon's office.    Take these medicines the morning of surgery with A SIP OF WATER : Amlodipine (Norvasc) Omeprazole (Prilosec) Rosuvastatin (Crestor)  Follow your surgeon's instructions on when to stop Aspirin.  If no instructions were given by your surgeon then you will need to call the office to get those instructions.    As of today, STOP taking any Aspirin (unless otherwise instructed by your surgeon) Aleve, Naproxen, Ibuprofen, Motrin, Advil, Goody's, BC's, all herbal medications, fish oil, and all vitamins.  WHAT DO I DO ABOUT MY DIABETES MEDICATION?   Do not take oral diabetes medicines (pills) the morning of surgery. DO NOT TAKE Glipizide (Glucotrol) or Metformin (Glucophage).   THE NIGHT BEFORE SURGERY, DO NOT TAKE EVENING dose of Glipizide  (Glucotrol)     HOW TO MANAGE YOUR DIABETES BEFORE AND AFTER SURGERY  Why is it important to control my blood sugar before and after surgery? Improving blood sugar levels before and after surgery helps healing and can limit problems. A way of improving blood sugar control is eating a healthy diet by:  Eating less sugar and carbohydrates  Increasing activity/exercise  Talking with your doctor about reaching your blood sugar goals High blood sugars (greater than 180 mg/dL) can raise your risk of infections and slow your recovery, so you will need to focus on controlling your diabetes during the weeks before surgery. Make sure that the doctor who takes care of your diabetes knows about your planned surgery including the date and location.  How do I manage my blood sugar before surgery? Check your blood sugar at least 4 times a day, starting 2 days before surgery, to make sure that the level is not too high or low.  Check your blood sugar the morning of your surgery when you wake up and every 2 hours until you get to the Short Stay unit.  If your blood sugar is less than 70 mg/dL, you will need to treat for low blood sugar: Do not take insulin. Treat a low blood sugar (less than 70 mg/dL) with  cup of clear juice (cranberry or apple), 4 glucose tablets, OR glucose gel. Recheck blood sugar in 15 minutes after treatment (to make sure it is greater than 70 mg/dL). If your blood sugar is not  greater than 70 mg/dL on recheck, call [phone] for further instructions. Report your blood sugar to the short stay nurse when you get to Short Stay.  If you are admitted to the hospital after surgery: Your blood sugar will be checked by the staff and you will probably be given insulin after surgery (instead of oral diabetes medicines) to make sure you have good blood sugar levels. The goal for blood sugar control after surgery is 80-180 mg/dL.                    Do not wear jewelry or makeup. Do  not wear lotions, powders, perfumes, or deodorant. Do not shave 48 hours prior to surgery.   Do not bring valuables to the hospital. Do not wear nail polish, gel polish, artificial nails, or any other type of covering on natural nails including finger and toenails. If patients have artificial nails, gel coating, etc. that need to be removed by a nail salon, please have this removed prior to surgery.  Surgery may need to be canceled/delayed if the surgeon/ anesthesia feels like the patient is unable to be adequately monitored due to artificial nails, gel coating, etc.   [facility] is not responsible for any belongings or valuables.  Do NOT Smoke (Tobacco/Vaping) or drink Alcohol 24 hours prior to your procedure If you use a CPAP at night, you may bring all equipment for your overnight stay.   Contacts, glasses, dentures or bridgework may not be worn into surgery, please bring cases for these belongings   For patients admitted to the hospital, discharge time will be determined by your treatment team.   Patients discharged the day of surgery will not be allowed to drive home, and someone needs to stay with them for 24 hours.  ONLY 1 SUPPORT PERSON MAY BE PRESENT WHILE YOU ARE IN SURGERY. IF YOU ARE TO BE ADMITTED ONCE YOU ARE IN YOUR ROOM YOU WILL BE ALLOWED TWO (2) VISITORS.  Minor children may have two parents present. Special consideration for safety and communication needs will be reviewed on a case by case basis.  Special instructions:   [facility]- Preparing For Surgery  Before surgery, you can play an important role. Because skin is not sterile, your skin needs to be as free of germs as possible. You can reduce the number of germs on your skin by washing with CHG (chlorahexidine gluconate) Soap before surgery.  CHG is an antiseptic cleaner which kills germs and bonds with the skin to continue killing germs even after washing.    Oral Hygiene is also important to reduce your risk of  infection.  Remember - BRUSH YOUR TEETH THE MORNING OF SURGERY WITH YOUR REGULAR TOOTHPASTE  Please do not use if you have an allergy to CHG or antibacterial soaps. If your skin becomes reddened/irritated stop using the CHG.  Do not shave (including legs and underarms) for at least 48 hours prior to first CHG shower. It is OK to shave your face.  Please follow these instructions carefully.   Shower the NIGHT BEFORE SURGERY and the MORNING OF SURGERY  If you chose to wash your hair, wash your hair first as usual with your normal shampoo.  After you shampoo, rinse your hair and body thoroughly to remove the shampoo.  Wash Face and genitals (private parts) with your normal soap.   Use CHG Soap as you would any other liquid soap. You can apply CHG directly to the skin and wash gently  with a scrungie or a clean washcloth.   Apply the CHG Soap to your body ONLY FROM THE NECK DOWN.  Do not use on open wounds or open sores. Avoid contact with your eyes, ears, mouth and genitals (private parts).   Wash thoroughly, paying special attention to the area where your surgery will be performed.  Thoroughly rinse your body with warm water from the neck down.  DO NOT shower/wash with your normal soap after using and rinsing off the CHG Soap.  Pat yourself dry with a CLEAN TOWEL.  Wear CLEAN PAJAMAS to bed the night before surgery  Place CLEAN SHEETS on your bed the night before your surgery  DO NOT SLEEP WITH PETS.   Day of Surgery: Take a shower with CHG soap as directed above. Wear Clean/Comfortable clothing the morning of surgery Do not apply any deodorants/lotions.   Remember to brush your teeth WITH YOUR REGULAR TOOTHPASTE.   Please read over the following fact sheets that you were given.

## 2020-12-25 ENCOUNTER — Encounter (HOSPITAL_COMMUNITY): Payer: Self-pay

## 2020-12-25 ENCOUNTER — Encounter (HOSPITAL_COMMUNITY)
Admission: RE | Admit: 2020-12-25 | Discharge: 2020-12-25 | Disposition: A | Discharge status: Home / Self Care | Payer: Medicare Other | Source: Ambulatory Visit | Attending: Orthopaedic Surgery | Admitting: Orthopaedic Surgery

## 2020-12-25 ENCOUNTER — Other Ambulatory Visit: Payer: Self-pay

## 2020-12-25 DIAGNOSIS — Z20822 Contact with and (suspected) exposure to covid-19: Secondary | ICD-10-CM | POA: Diagnosis not present

## 2020-12-25 DIAGNOSIS — Z79899 Other long term (current) drug therapy: Secondary | ICD-10-CM | POA: Diagnosis not present

## 2020-12-25 DIAGNOSIS — I4510 Unspecified right bundle-branch block: Secondary | ICD-10-CM | POA: Diagnosis not present

## 2020-12-25 DIAGNOSIS — Z01818 Encounter for other preprocedural examination: Secondary | ICD-10-CM | POA: Diagnosis not present

## 2020-12-25 LAB — BASIC METABOLIC PANEL
Anion gap: 5 (ref 5–15)
BUN: 17 mg/dL (ref 8–23)
CO2: 29 mmol/L (ref 22–32)
Calcium: 9.2 mg/dL (ref 8.9–10.3)
Chloride: 105 mmol/L (ref 98–111)
Creatinine, Ser: 0.97 mg/dL (ref 0.44–1.00)
GFR, Estimated: >60 mL/min (ref >60)
Glucose, Bld: 107 mg/dL — ABNORMAL HIGH (ref 70–99)
Potassium: 3.9 mmol/L (ref 3.5–5.1)
Sodium: 139 mmol/L (ref 135–145)

## 2020-12-25 LAB — SURGICAL PCR SCREEN
MRSA, PCR: NEGATIVE
Staphylococcus aureus: NEGATIVE

## 2020-12-25 LAB — CBC
HCT: 40.6 % (ref 36.0–46.0)
Hemoglobin: 14.1 g/dL (ref 12.0–15.0)
MCH: 28.2 pg (ref 26.0–34.0)
MCHC: 34.7 g/dL (ref 30.0–36.0)
MCV: 81.2 fL (ref 80.0–100.0)
Platelets: 322 10*3/uL (ref 150–400)
RBC: 5 MIL/uL (ref 3.87–5.11)
RDW: 13.2 % (ref 11.5–15.5)
WBC: 7.7 10*3/uL (ref 4.0–10.5)
nRBC: 0 % (ref 0.0–0.2)

## 2020-12-25 LAB — TYPE AND SCREEN
ABO/RH(D): A POS
Antibody Screen: NEGATIVE

## 2020-12-25 LAB — SARS CORONAVIRUS 2 (TAT 6-24 HRS): SARS Coronavirus 2: NEGATIVE

## 2020-12-25 LAB — GLUCOSE, CAPILLARY: Glucose-Capillary: 126 mg/dL — ABNORMAL HIGH (ref 70–99)

## 2020-12-25 NOTE — Progress Notes (Signed)
PCP - Marlan Palau, MD Cardiologist - pt denies  PPM/ICD - n/a  Chest x-ray - n/a EKG - 9/922 in PAT Stress Test - 09/28/09 ECHO - 09/27/09 Cardiac Cath - pt denies  Sleep Study - pt denies  Fasting Blood Sugar - pt reports that she does not check it; 126 in PAT   Blood Thinner Instructions: n/a Aspirin Instructions: Follow your surgeon's instructions on when to stop Aspirin.  If no instructions were given by your surgeon then you will need to call the office to get those instructions.    ERAS Protcol - yes PRE-SURGERY Ensure or G2- 10oz bottle of water  COVID TEST- 12/25/20 in PAT  Anesthesia review: yes, abnormal EKG  Patient denies shortness of breath, fever, cough and chest pain at PAT appointment   All instructions explained to the patient, with a verbal understanding of the material. Patient agrees to go over the instructions while at home for a better understanding. Patient also instructed to self quarantine after being tested for COVID-19. The opportunity to ask questions was provided.

## 2020-12-25 NOTE — Progress Notes (Signed)
Surgical Instructions    Your procedure is scheduled on Tuesday, September 13th, 2022.  Report to [facility] Main Entrance "A" at 10:00 A.M., then check in with the Admitting office.  Call this number if you have problems the morning of surgery:  [phone]   If you have any questions prior to your surgery date call [phone]: Open Monday-Friday 8am-4pm    Remember:  Do not eat after midnight the night before your surgery  You may drink clear liquids until 9:00am the morning of your surgery.   Clear liquids allowed are: Water, Non-Citrus Juices (without pulp), Carbonated Beverages, Clear Tea, Black Coffee Only, and Gatorade   Enhanced Recovery after Surgery for Orthopedics Enhanced Recovery after Surgery is a protocol used to improve the stress on your body and your recovery after surgery.  Patient Instructions  The day of surgery (if you have diabetes):  Drink ONE small 10 oz bottle of water by 9:00 am the morning of surgery This bottle was given to you during your hospital  pre-op appointment visit.  Nothing else to drink after completing the  Small 10 oz bottle of water.         If you have questions, please contact your surgeon's office.    Take these medicines the morning of surgery with A SIP OF WATER : Amlodipine (Norvasc) Omeprazole (Prilosec) Rosuvastatin (Crestor)  Follow your surgeon's instructions on when to stop Aspirin.  If no instructions were given by your surgeon then you will need to call the office to get those instructions.    As of today, STOP taking any Aspirin (unless otherwise instructed by your surgeon) Aleve, Naproxen, Ibuprofen, Motrin, Advil, Goody's, BC's, all herbal medications, fish oil, and all vitamins.  WHAT DO I DO ABOUT MY DIABETES MEDICATION?   Do not take oral diabetes medicines (pills) the morning of surgery. DO NOT TAKE Glipizide (Glucotrol) or Metformin (Glucophage).   THE NIGHT BEFORE SURGERY, DO NOT TAKE EVENING dose of  Glipizide (Glucotrol)     HOW TO MANAGE YOUR DIABETES BEFORE AND AFTER SURGERY  Why is it important to control my blood sugar before and after surgery? Improving blood sugar levels before and after surgery helps healing and can limit problems. A way of improving blood sugar control is eating a healthy diet by:  Eating less sugar and carbohydrates  Increasing activity/exercise  Talking with your doctor about reaching your blood sugar goals High blood sugars (greater than 180 mg/dL) can raise your risk of infections and slow your recovery, so you will need to focus on controlling your diabetes during the weeks before surgery. Make sure that the doctor who takes care of your diabetes knows about your planned surgery including the date and location.  How do I manage my blood sugar before surgery? Check your blood sugar at least 4 times a day, starting 2 days before surgery, to make sure that the level is not too high or low.  Check your blood sugar the morning of your surgery when you wake up and every 2 hours until you get to the Short Stay unit.  If your blood sugar is less than 70 mg/dL, you will need to treat for low blood sugar: Do not take insulin. Treat a low blood sugar (less than 70 mg/dL) with  cup of clear juice (cranberry or apple), 4 glucose tablets, OR glucose gel. Recheck blood sugar in 15 minutes after treatment (to make sure it is greater than 70 mg/dL). If your blood sugar is  not greater than 70 mg/dL on recheck, call [phone] for further instructions. Report your blood sugar to the short stay nurse when you get to Short Stay.  If you are admitted to the hospital after surgery: Your blood sugar will be checked by the staff and you will probably be given insulin after surgery (instead of oral diabetes medicines) to make sure you have good blood sugar levels. The goal for blood sugar control after surgery is 80-180 mg/dL.                    Do not wear jewelry or  makeup. Do not wear lotions, powders, perfumes, or deodorant. Do not shave 48 hours prior to surgery.   Do not bring valuables to the hospital. Do not wear nail polish, gel polish, artificial nails, or any other type of covering on natural nails including finger and toenails. If patients have artificial nails, gel coating, etc. that need to be removed by a nail salon, please have this removed prior to surgery.  Surgery may need to be canceled/delayed if the surgeon/ anesthesia feels like the patient is unable to be adequately monitored due to artificial nails, gel coating, etc.   [facility] is not responsible for any belongings or valuables.  Do NOT Smoke (Tobacco/Vaping) or drink Alcohol 24 hours prior to your procedure If you use a CPAP at night, you may bring all equipment for your overnight stay.   Contacts, glasses, dentures or bridgework may not be worn into surgery, please bring cases for these belongings   For patients admitted to the hospital, discharge time will be determined by your treatment team.   Patients discharged the day of surgery will not be allowed to drive home, and someone needs to stay with them for 24 hours.  ONLY 1 SUPPORT PERSON MAY BE PRESENT WHILE YOU ARE IN SURGERY. IF YOU ARE TO BE ADMITTED ONCE YOU ARE IN YOUR ROOM YOU WILL BE ALLOWED TWO (2) VISITORS.  Minor children may have two parents present. Special consideration for safety and communication needs will be reviewed on a case by case basis.  Special instructions:   [facility]- Preparing For Surgery  Before surgery, you can play an important role. Because skin is not sterile, your skin needs to be as free of germs as possible. You can reduce the number of germs on your skin by washing with CHG (chlorahexidine gluconate) Soap before surgery.  CHG is an antiseptic cleaner which kills germs and bonds with the skin to continue killing germs even after washing.    Oral Hygiene is also important to reduce your  risk of infection.  Remember - BRUSH YOUR TEETH THE MORNING OF SURGERY WITH YOUR REGULAR TOOTHPASTE  Please do not use if you have an allergy to CHG or antibacterial soaps. If your skin becomes reddened/irritated stop using the CHG.  Do not shave (including legs and underarms) for at least 48 hours prior to first CHG shower. It is OK to shave your face.  Please follow these instructions carefully.   Shower the NIGHT BEFORE SURGERY and the MORNING OF SURGERY  If you chose to wash your hair, wash your hair first as usual with your normal shampoo.  After you shampoo, rinse your hair and body thoroughly to remove the shampoo.  Wash Face and genitals (private parts) with your normal soap.   Use CHG Soap as you would any other liquid soap. You can apply CHG directly to the skin and wash  gently with a scrungie or a clean washcloth.   Apply the CHG Soap to your body ONLY FROM THE NECK DOWN.  Do not use on open wounds or open sores. Avoid contact with your eyes, ears, mouth and genitals (private parts).   Wash thoroughly, paying special attention to the area where your surgery will be performed.  Thoroughly rinse your body with warm water from the neck down.  DO NOT shower/wash with your normal soap after using and rinsing off the CHG Soap.  Pat yourself dry with a CLEAN TOWEL.  Wear CLEAN PAJAMAS to bed the night before surgery  Place CLEAN SHEETS on your bed the night before your surgery  DO NOT SLEEP WITH PETS.   Day of Surgery: Take a shower with CHG soap as directed above. Wear Clean/Comfortable clothing the morning of surgery Do not apply any deodorants/lotions.   Remember to brush your teeth WITH YOUR REGULAR TOOTHPASTE.   Please read over the following fact sheets that you were given.

## 2020-12-28 ENCOUNTER — Telehealth: Payer: Self-pay | Admitting: *Deleted

## 2020-12-28 ENCOUNTER — Ambulatory Visit (HOSPITAL_COMMUNITY): Payer: Medicare Other | Admitting: Physician Assistant

## 2020-12-28 ENCOUNTER — Ambulatory Visit (HOSPITAL_COMMUNITY): Payer: Medicare Other

## 2020-12-28 NOTE — Telephone Encounter (Signed)
Ortho bundle pre-op call completed. 

## 2020-12-28 NOTE — Care Plan (Signed)
[facility] office RNCM call to patient to discuss her upcoming R-TKA with Dr. Magnus Ivan. She is an Ortho bundle patient through [facility] and is agreeable to case management. She will return home with assistance from a friend and her grandson. She has a RW and BSC/3in1 at home. No DME needed. Choice provided and referral to HHPT made with [facility]. Reviewed post op information and allowed patient to ask questions. Will continue to follow for needs.

## 2020-12-29 ENCOUNTER — Encounter (HOSPITAL_COMMUNITY): Payer: Self-pay | Admitting: Orthopaedic Surgery

## 2020-12-29 ENCOUNTER — Other Ambulatory Visit: Payer: Self-pay | Admitting: Orthopaedic Surgery

## 2020-12-29 ENCOUNTER — Other Ambulatory Visit: Payer: Self-pay

## 2020-12-29 ENCOUNTER — Ambulatory Visit (HOSPITAL_COMMUNITY)
Admission: RE | Admit: 2020-12-29 | Discharge: 2020-12-29 | Disposition: A | Discharge status: Home / Self Care | Payer: Medicare Other | Attending: Orthopaedic Surgery | Admitting: Orthopaedic Surgery

## 2020-12-29 ENCOUNTER — Encounter (HOSPITAL_COMMUNITY)
Admission: RE | Disposition: A | Discharge status: Home / Self Care | Payer: Self-pay | Source: Home / Self Care | Attending: Orthopaedic Surgery

## 2020-12-29 DIAGNOSIS — E119 Type 2 diabetes mellitus without complications: Secondary | ICD-10-CM | POA: Diagnosis not present

## 2020-12-29 DIAGNOSIS — Z01818 Encounter for other preprocedural examination: Secondary | ICD-10-CM | POA: Insufficient documentation

## 2020-12-29 DIAGNOSIS — Z538 Procedure and treatment not carried out for other reasons: Secondary | ICD-10-CM | POA: Insufficient documentation

## 2020-12-29 LAB — GLUCOSE, CAPILLARY: Glucose-Capillary: 133 mg/dL — ABNORMAL HIGH (ref 70–99)

## 2020-12-29 LAB — ABO/RH: ABO/RH(D): A POS

## 2020-12-29 SURGERY — ARTHROPLASTY, KNEE, TOTAL
Anesthesia: Spinal | Site: Knee | Laterality: Right

## 2020-12-29 MED ORDER — FENTANYL CITRATE (PF) 250 MCG/5ML IJ SOLN
INTRAMUSCULAR | Status: AC
  Filled 2020-12-29: qty 5

## 2020-12-29 MED ORDER — CHLORHEXIDINE GLUCONATE 0.12 % MT SOLN
15.0000 mL | Freq: Once | OROMUCOSAL | Status: AC
  Administered 2020-12-29: 15 mL via OROMUCOSAL
  Filled 2020-12-29: qty 15

## 2020-12-29 MED ORDER — CLINDAMYCIN HCL 150 MG PO CAPS: 150.0000 mg | ORAL_CAPSULE | Freq: Three times a day (TID) | ORAL | 0 refills | Status: AC

## 2020-12-29 MED ORDER — TRANEXAMIC ACID-NACL 1000-0.7 MG/100ML-% IV SOLN
1000.0000 mg | INTRAVENOUS | Status: DC
  Filled 2020-12-29: qty 100

## 2020-12-29 MED ORDER — POVIDONE-IODINE 10 % EX SWAB
2.0000 "application " | Freq: Once | CUTANEOUS | Status: AC
  Administered 2020-12-29: 2 via TOPICAL

## 2020-12-29 MED ORDER — ACETAMINOPHEN 500 MG PO TABS: 1000.0000 mg | ORAL_TABLET | Freq: Once | ORAL | Status: DC

## 2020-12-29 MED ORDER — ORAL CARE MOUTH RINSE: 15.0000 mL | Freq: Once | OROMUCOSAL | Status: AC

## 2020-12-29 MED ORDER — CLINDAMYCIN PHOSPHATE 900 MG/50ML IV SOLN
900.0000 mg | INTRAVENOUS | Status: DC
  Filled 2020-12-29: qty 50

## 2020-12-29 MED ORDER — LACTATED RINGERS IV SOLN: INTRAVENOUS | Status: DC

## 2020-12-29 MED ORDER — FENTANYL CITRATE (PF) 100 MCG/2ML IJ SOLN
INTRAMUSCULAR | Status: AC
  Filled 2020-12-29: qty 2

## 2020-12-29 MED ORDER — MIDAZOLAM HCL 2 MG/2ML IJ SOLN
INTRAMUSCULAR | Status: AC
  Filled 2020-12-29: qty 2

## 2020-12-29 NOTE — Anesthesia Preprocedure Evaluation (Addendum)
Anesthesia Evaluation  Patient identified by MRN, date of birth, ID band Patient awake    Reviewed: Allergy & Precautions, NPO status , Patient's Chart, lab work & pertinent test results  History of Anesthesia Complications Negative for: history of anesthetic complications  Airway Mallampati: II  TM Distance: >3 FB Neck ROM: Full    Dental  (+) Dental Advisory Given, Missing, Chipped   Pulmonary neg pulmonary ROS,  12/25/2020 SARS coronavirus NEG   breath sounds clear to auscultation       Cardiovascular hypertension, Pt. on medications (-) angina Rhythm:Regular Rate:Normal  '11 ECHO: EF 55-60%. Wall motion was normal; there were no regional wall motion abnormalities, mild AI '11 Stress:  Normal perfusion   Neuro/Psych Anxiety negative neurological ROS     GI/Hepatic Neg liver ROS, GERD  Medicated and Controlled,  Endo/Other  diabetes (glu 133), Oral Hypoglycemic Agentsobese  Renal/GU negative Renal ROS     Musculoskeletal   Abdominal (+) + obese,   Peds  Hematology negative hematology ROS (+)   Anesthesia Other Findings   Reproductive/Obstetrics                            Anesthesia Physical Anesthesia Plan  ASA: 3  Anesthesia Plan: Spinal   Post-op Pain Management:  Regional for Post-op pain   Induction:   PONV Risk Score and Plan: 2 and Ondansetron, Treatment may vary due to age or medical condition and Dexamethasone  Airway Management Planned: Natural Airway and Simple Face Mask  Additional Equipment: None  Intra-op Plan:   Post-operative Plan:   Informed Consent: I have reviewed the patients History and Physical, chart, labs and discussed the procedure including the risks, benefits and alternatives for the proposed anesthesia with the patient or authorized representative who has indicated his/her understanding and acceptance.     Dental advisory given  Plan Discussed  with: CRNA and Surgeon  Anesthesia Plan Comments: (Plan routine monitors, SAB with adductor canal block for post op analgesia  Addendum: pt with recent dental abscess, Dr. Magnus Ivan is postponing surgery)      Anesthesia Quick Evaluation

## 2020-12-29 NOTE — Progress Notes (Signed)
Informed Dr. Hart Rochester of elevated blood pressure.  No order received.

## 2020-12-29 NOTE — Progress Notes (Signed)
Patient's IV removed.  Site clean, dry, and intact.  Patient escorted to main lobby by nurse tech.

## 2020-12-29 NOTE — H&P (Signed)
  I just spoke to the patient and the short stay preop area.  Apparently she had a tooth abscess with a tooth extraction just 5 days ago on one of her left lower teeth.  She actually had that procedure delayed for a week or more due to her blood pressure being up and she was on antibiotics up until the procedure 5 days ago.  She still reports some soreness in her mouth from all the injections.  She has not been on any antibiotics since then.  I explained to her that this is still a contraindication for a total joint replacement so soon after having dealt with that abscess because the of the risk of having still some bacteria in her mouth or in her body that can give directly to that knee replacement given a heightened blood supply to that area.  He would be better to delay her surgery for a few weeks in order for her to completely healed the wound in her mouth and lessen her risk of infection for an elective total joint replacement.  She understands this as well.  I will still send in an antibiotic for her to take for the next week.  We will be in touch with her about rescheduling surgery for a later date for a right knee replacement.

## 2021-01-12 ENCOUNTER — Encounter: Payer: Self-pay | Admitting: Orthopaedic Surgery

## 2021-01-12 ENCOUNTER — Ambulatory Visit (INDEPENDENT_AMBULATORY_CARE_PROVIDER_SITE_OTHER): Payer: Medicare Other | Admitting: Orthopaedic Surgery

## 2021-01-12 ENCOUNTER — Other Ambulatory Visit: Payer: Self-pay

## 2021-01-12 VITALS — Ht 63.0 in | Wt 173.4 lb

## 2021-01-12 DIAGNOSIS — M25562 Pain in left knee: Secondary | ICD-10-CM | POA: Diagnosis not present

## 2021-01-12 DIAGNOSIS — M25561 Pain in right knee: Secondary | ICD-10-CM | POA: Diagnosis not present

## 2021-01-12 DIAGNOSIS — M1712 Unilateral primary osteoarthritis, left knee: Secondary | ICD-10-CM | POA: Diagnosis not present

## 2021-01-12 DIAGNOSIS — G8929 Other chronic pain: Secondary | ICD-10-CM

## 2021-01-12 DIAGNOSIS — M1711 Unilateral primary osteoarthritis, right knee: Secondary | ICD-10-CM | POA: Diagnosis not present

## 2021-01-12 NOTE — Progress Notes (Signed)
Today was originally going to be a 2-week follow-up for this patient after having a knee replacement for her right knee.  However we had to cancel surgery the day of surgery because she had had a tooth pulled for an abscess just 5 days prior to that.  She was on antibiotics up until the abscess being pulled and that was delayed in terms of her tooth being pulled so we are concerned about still having some bacteria in her system and it was appropriate to not do an elective total knee arthroplasty that day and she understands that completely.  On exam her mouth is assessed and the abscess is completely gone and the area where the tooth has been pulled is healed over completely.  She is still in need of knee replacement surgery nothing is change from my standpoint.  I did speak with our surgery scheduler and we have her on the schedule for Thursday, November 3 at [facility].  She agrees to proceed with surgery at that time and we would then see her back in 2 weeks postoperative.  Nothing else is change acutely in her medical status.

## 2021-01-15 ENCOUNTER — Other Ambulatory Visit: Payer: Medicare Other | Admitting: Internal Medicine

## 2021-01-15 DIAGNOSIS — E119 Type 2 diabetes mellitus without complications: Secondary | ICD-10-CM

## 2021-01-15 DIAGNOSIS — E1169 Type 2 diabetes mellitus with other specified complication: Secondary | ICD-10-CM

## 2021-01-15 DIAGNOSIS — R7302 Impaired glucose tolerance (oral): Secondary | ICD-10-CM

## 2021-01-18 ENCOUNTER — Ambulatory Visit: Payer: Medicare Other | Admitting: Internal Medicine

## 2021-01-18 ENCOUNTER — Other Ambulatory Visit: Payer: Self-pay

## 2021-01-18 ENCOUNTER — Other Ambulatory Visit: Payer: Medicare Other | Admitting: Internal Medicine

## 2021-01-21 ENCOUNTER — Other Ambulatory Visit: Payer: Medicare Other | Admitting: Internal Medicine

## 2021-01-21 ENCOUNTER — Other Ambulatory Visit: Payer: Self-pay

## 2021-01-21 DIAGNOSIS — I10 Essential (primary) hypertension: Secondary | ICD-10-CM | POA: Diagnosis not present

## 2021-01-21 DIAGNOSIS — E119 Type 2 diabetes mellitus without complications: Secondary | ICD-10-CM | POA: Diagnosis not present

## 2021-01-21 DIAGNOSIS — E785 Hyperlipidemia, unspecified: Secondary | ICD-10-CM | POA: Diagnosis not present

## 2021-01-21 DIAGNOSIS — E1169 Type 2 diabetes mellitus with other specified complication: Secondary | ICD-10-CM | POA: Diagnosis not present

## 2021-01-22 LAB — LIPID PANEL
Cholesterol: 149 mg/dL (ref <200)
HDL: 56 mg/dL (ref >=50)
LDL Cholesterol (Calc): 74 mg/dL (calc)
Non-HDL Cholesterol (Calc): 93 mg/dL (calc) (ref <130)
Total CHOL/HDL Ratio: 2.7 (calc) (ref <5.0)
Triglycerides: 109 mg/dL (ref <150)

## 2021-01-22 LAB — COMPLETE METABOLIC PANEL WITH GFR
AG Ratio: 1.6 (calc) (ref 1.0–2.5)
ALT: 29 U/L (ref 6–29)
AST: 24 U/L (ref 10–35)
Albumin: 4.5 g/dL (ref 3.6–5.1)
Alkaline phosphatase (APISO): 91 U/L (ref 37–153)
BUN/Creatinine Ratio: 19 (calc) (ref 6–22)
BUN: 22 mg/dL (ref 7–25)
CO2: 32 mmol/L (ref 20–32)
Calcium: 9.6 mg/dL (ref 8.6–10.4)
Chloride: 103 mmol/L (ref 98–110)
Creat: 1.18 mg/dL — ABNORMAL HIGH (ref 0.60–1.00)
Globulin: 2.8 g/dL (calc) (ref 1.9–3.7)
Glucose, Bld: 81 mg/dL (ref 65–99)
Potassium: 4.7 mmol/L (ref 3.5–5.3)
Sodium: 144 mmol/L (ref 135–146)
Total Bilirubin: 0.8 mg/dL (ref 0.2–1.2)
Total Protein: 7.3 g/dL (ref 6.1–8.1)
eGFR: 48 mL/min/{1.73_m2} — ABNORMAL LOW (ref >=60)

## 2021-01-22 LAB — HEMOGLOBIN A1C
Hgb A1c MFr Bld: 6.7 % of total Hgb — ABNORMAL HIGH (ref <5.7)
Mean Plasma Glucose: 146 mg/dL
eAG (mmol/L): 8.1 mmol/L

## 2021-01-25 ENCOUNTER — Encounter: Payer: Self-pay | Admitting: Internal Medicine

## 2021-01-25 ENCOUNTER — Ambulatory Visit (INDEPENDENT_AMBULATORY_CARE_PROVIDER_SITE_OTHER): Payer: Medicare Other | Admitting: Internal Medicine

## 2021-01-25 ENCOUNTER — Other Ambulatory Visit: Payer: Self-pay

## 2021-01-25 VITALS — BP 122/68 | HR 62 | Temp 98.0°F | Ht 63.0 in | Wt 177.0 lb

## 2021-01-25 DIAGNOSIS — I10 Essential (primary) hypertension: Secondary | ICD-10-CM | POA: Diagnosis not present

## 2021-01-25 DIAGNOSIS — Z23 Encounter for immunization: Secondary | ICD-10-CM | POA: Diagnosis not present

## 2021-01-25 DIAGNOSIS — F439 Reaction to severe stress, unspecified: Secondary | ICD-10-CM

## 2021-01-25 DIAGNOSIS — N1831 Chronic kidney disease, stage 3a: Secondary | ICD-10-CM

## 2021-01-25 DIAGNOSIS — K219 Gastro-esophageal reflux disease without esophagitis: Secondary | ICD-10-CM | POA: Diagnosis not present

## 2021-01-25 DIAGNOSIS — E785 Hyperlipidemia, unspecified: Secondary | ICD-10-CM | POA: Diagnosis not present

## 2021-01-25 DIAGNOSIS — Z96651 Presence of right artificial knee joint: Secondary | ICD-10-CM

## 2021-01-25 DIAGNOSIS — E1169 Type 2 diabetes mellitus with other specified complication: Secondary | ICD-10-CM | POA: Diagnosis not present

## 2021-01-25 NOTE — Progress Notes (Signed)
D   Subjective:    Patient ID: Kathleen Greer, female    DOB: June 25, 1945, 75 y.o.   MRN: [id number]  HPI  75 year old Female for follow up. Was here in August for recheck.  Had 55-month recheck in August.  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 Magnus Ivan and has done extremely well.  History of GE reflux treated with PPI, hypertension, dependent edema, glucose intolerance.  Reflux is stable with PPI.  She is here now for follow-up on hypertension.  When she was here in August, blood pressure was 140/90 but she was upset with some situational stress in her family.    Her lipid panel is also checked today.  In August, total cholesterol was 214 and LDL cholesterol was 120.  Lipids are now completely normal.  She is taking Crestor 5 mg daily instead of once a week.  She is watching her diet.    Diabetes is treated with glipizide and metformin.  Hemoglobin A1c stable at 6.7% currently.  Was 6.7% in August.  Will likely be more active now she is status post knee replacement.    Review of Systems see above-no new complaints     Objective:   Physical Exam  BP 122/68 pulse 62 normal  Skin warm and dry.  No cervical adenopathy.  No thyromegaly.  No carotid bruits.  Chest is clear to auscultation.  Cardiac exam: Regular rate and rhythm.  No lower extremity pitting edema.     Assessment & Plan:  Diabetes mellitus-fasting glucose is 81 and hemoglobin A1c is 6.7%.  Continue current medications and work on diet and exercise.  Hyperlipidemia-now taking Crestor 5 mg daily and lipids have completely normalized.  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  Continue current medications.  Annual pleased with lipids..   Return in February for health maintenance exam and Medicare wellness visit.

## 2021-02-05 ENCOUNTER — Other Ambulatory Visit: Payer: Self-pay | Admitting: Internal Medicine

## 2021-02-05 DIAGNOSIS — Z1231 Encounter for screening mammogram for malignant neoplasm of breast: Secondary | ICD-10-CM

## 2021-02-07 ENCOUNTER — Other Ambulatory Visit: Payer: Self-pay | Admitting: Internal Medicine

## 2021-02-08 ENCOUNTER — Other Ambulatory Visit: Payer: Self-pay | Admitting: Physician Assistant

## 2021-02-08 DIAGNOSIS — M1711 Unilateral primary osteoarthritis, right knee: Secondary | ICD-10-CM

## 2021-02-15 NOTE — Pre-Procedure Instructions (Signed)
Surgical Instructions    Your procedure is scheduled on Thursday 02/18/21.   Report to [facility] Main Entrance "A" at 08:00 A.M., then check in with the Admitting office.  Call this number if you have problems the morning of surgery:  [phone]   If you have any questions prior to your surgery date call [phone]: Open Monday-Friday 8am-4pm    Remember:  Do not eat after midnight the night before your surgery  You may drink clear liquids until 07:00 A.M. the morning of your surgery.   Clear liquids allowed are: Water, Non-Citrus Juices (without pulp), Carbonated Beverages, Clear Tea, Black Coffee ONLY (NO MILK, CREAM OR POWDERED CREAMER of any kind), and Gatorade  Patient Instructions  The night before surgery:  No food after midnight. ONLY clear liquids after midnight  The day of surgery (if you have diabetes): Drink ONE (1) 12 oz G2 given to you in your pre admission testing appointment by 07:00 A.M. the morning of surgery. Drink in one sitting. Do not sip.  This drink was given to you during your hospital  pre-op appointment visit.  Nothing else to drink after completing the  12 oz bottle of G2.         If you have questions, please contact your surgeon's office.     Take these medicines the morning of surgery with A SIP OF WATER   amLODipine (NORVASC)  omeprazole (PRILOSEC)   rosuvastatin (CRESTOR)    Take these medicines if needed:  fluticasone (FLONASE)  sodium chloride (OCEAN)     As of today, STOP taking any Aspirin (unless otherwise instructed by your surgeon) diclofenac Sodium (VOLTAREN), Aleve, Naproxen, Ibuprofen, Motrin, Advil, Goody's, BC's, all herbal medications, fish oil, and all vitamins.  WHAT DO I DO ABOUT MY DIABETES MEDICATION?   Do not take oral diabetes medicines (pills) the morning of surgery.  DO NOT TAKE glipiZIDE (GLUCOTROL) the evening before surgery or the morning of surgery.  DO NOT TAKE metFORMIN (GLUCOPHAGE) the morning of  surgery.  The day of surgery, do not take other diabetes injectables, including Byetta (exenatide), Bydureon (exenatide ER), Victoza (liraglutide), or Trulicity (dulaglutide).  If your CBG is greater than 220 mg/dL, you may take  of your sliding scale (correction) dose of insulin.   HOW TO MANAGE YOUR DIABETES BEFORE AND AFTER SURGERY  Why is it important to control my blood sugar before and after surgery? Improving blood sugar levels before and after surgery helps healing and can limit problems. A way of improving blood sugar control is eating a healthy diet by:  Eating less sugar and carbohydrates  Increasing activity/exercise  Talking with your doctor about reaching your blood sugar goals High blood sugars (greater than 180 mg/dL) can raise your risk of infections and slow your recovery, so you will need to focus on controlling your diabetes during the weeks before surgery. Make sure that the doctor who takes care of your diabetes knows about your planned surgery including the date and location.  How do I manage my blood sugar before surgery? Check your blood sugar at least 4 times a day, starting 2 days before surgery, to make sure that the level is not too high or low.  Check your blood sugar the morning of your surgery when you wake up and every 2 hours until you get to the Short Stay unit.  If your blood sugar is less than 70 mg/dL, you will need to treat for low blood sugar: Do not take insulin.  Treat a low blood sugar (less than 70 mg/dL) with  cup of clear juice (cranberry or apple), 4 glucose tablets, OR glucose gel. Recheck blood sugar in 15 minutes after treatment (to make sure it is greater than 70 mg/dL). If your blood sugar is not greater than 70 mg/dL on recheck, call [phone] for further instructions. Report your blood sugar to the short stay nurse when you get to Short Stay.  If you are admitted to the hospital after surgery: Your blood sugar will be checked by  the staff and you will probably be given insulin after surgery (instead of oral diabetes medicines) to make sure you have good blood sugar levels. The goal for blood sugar control after surgery is 80-180 mg/dL.     After your COVID test   You are not required to quarantine however you are required to wear a well-fitting mask when you are out and around people not in your household.  If your mask becomes wet or soiled, replace with a new one.  Wash your hands often with soap and water for 20 seconds or clean your hands with an alcohol-based hand sanitizer that contains at least 60% alcohol.  Do not share personal items.  Notify your provider: if you are in close contact with someone who has COVID  or if you develop a fever of 100.4 or greater, sneezing, cough, sore throat, shortness of breath or body aches.             Do not wear jewelry or makeup Do not wear lotions, powders, perfumes/colognes, or deodorant. Do not shave 48 hours prior to surgery.  Men may shave face and neck. Do not bring valuables to the hospital. DO Not wear nail polish, gel polish, artificial nails, or any other type of covering on natural nails including finger and toenails. If patients have artificial nails, gel coating, etc. that need to be removed by a nail salon, please have this removed prior to surgery or surgery may need to be canceled/delayed if the surgeon/ anesthesia feels like the patient is unable to be adequately monitored.             [facility] is not responsible for any belongings or valuables.  Do NOT Smoke (Tobacco/Vaping)  24 hours prior to your procedure  If you use a CPAP at night, you may bring your mask for your overnight stay.   Contacts, glasses, hearing aids, dentures or partials may not be worn into surgery, please bring cases for these belongings   For patients admitted to the hospital, discharge time will be determined by your treatment team.   Patients discharged the day of  surgery will not be allowed to drive home, and someone needs to stay with them for 24 hours.  NO VISITORS WILL BE ALLOWED IN PRE-OP WHERE PATIENTS ARE PREPPED FOR SURGERY.  ONLY 1 SUPPORT PERSON MAY BE PRESENT IN THE WAITING ROOM WHILE YOU ARE IN SURGERY.  IF YOU ARE TO BE ADMITTED, ONCE YOU ARE IN YOUR ROOM YOU WILL BE ALLOWED TWO (2) VISITORS. 1 (ONE) VISITOR MAY STAY OVERNIGHT BUT MUST ARRIVE TO THE ROOM BY 8pm.  Minor children may have two parents present. Special consideration for safety and communication needs will be reviewed on a case by case basis.  Special instructions:    Oral Hygiene is also important to reduce your risk of infection.  Remember - BRUSH YOUR TEETH THE MORNING OF SURGERY WITH YOUR REGULAR TOOTHPASTE   - Preparing  For Surgery  Before surgery, you can play an important role. Because skin is not sterile, your skin needs to be as free of germs as possible. You can reduce the number of germs on your skin by washing with CHG (chlorahexidine gluconate) Soap before surgery.  CHG is an antiseptic cleaner which kills germs and bonds with the skin to continue killing germs even after washing.     Please do not use if you have an allergy to CHG or antibacterial soaps. If your skin becomes reddened/irritated stop using the CHG.  Do not shave (including legs and underarms) for at least 48 hours prior to first CHG shower. It is OK to shave your face.  Please follow these instructions carefully.     Shower the NIGHT BEFORE SURGERY and the MORNING OF SURGERY with CHG Soap.   If you chose to wash your hair, wash your hair first as usual with your normal shampoo. After you shampoo, rinse your hair and body thoroughly to remove the shampoo.  Then [employer] and genitals (private parts) with your normal soap and rinse thoroughly to remove soap.  After that Use CHG Soap as you would any other liquid soap. You can apply CHG directly to the skin and wash gently with a scrungie or  a clean washcloth.   Apply the CHG Soap to your body ONLY FROM THE NECK DOWN.  Do not use on open wounds or open sores. Avoid contact with your eyes, ears, mouth and genitals (private parts). Wash Face and genitals (private parts)  with your normal soap.   Wash thoroughly, paying special attention to the area where your surgery will be performed.  Thoroughly rinse your body with warm water from the neck down.  DO NOT shower/wash with your normal soap after using and rinsing off the CHG Soap.  Pat yourself dry with a CLEAN TOWEL.  Wear CLEAN PAJAMAS to bed the night before surgery  Place CLEAN SHEETS on your bed the night before your surgery  DO NOT SLEEP WITH PETS.   Day of Surgery:  Take a shower with CHG soap. Wear Clean/Comfortable clothing the morning of surgery Do not apply any deodorants/lotions.   Remember to brush your teeth WITH YOUR REGULAR TOOTHPASTE.   Please read over the following fact sheets that you were given.

## 2021-02-16 ENCOUNTER — Other Ambulatory Visit: Payer: Self-pay

## 2021-02-16 ENCOUNTER — Encounter (HOSPITAL_COMMUNITY)
Admission: RE | Admit: 2021-02-16 | Discharge: 2021-02-16 | Disposition: A | Discharge status: Home / Self Care | Payer: Medicare Other | Source: Ambulatory Visit | Attending: Orthopaedic Surgery | Admitting: Orthopaedic Surgery

## 2021-02-16 ENCOUNTER — Encounter (HOSPITAL_COMMUNITY): Payer: Self-pay

## 2021-02-16 VITALS — BP 175/82 | HR 73 | Temp 98.2°F | Resp 18 | Ht 63.0 in | Wt 177.8 lb

## 2021-02-16 DIAGNOSIS — M1711 Unilateral primary osteoarthritis, right knee: Secondary | ICD-10-CM | POA: Diagnosis not present

## 2021-02-16 DIAGNOSIS — K589 Irritable bowel syndrome without diarrhea: Secondary | ICD-10-CM | POA: Diagnosis not present

## 2021-02-16 DIAGNOSIS — Z01812 Encounter for preprocedural laboratory examination: Secondary | ICD-10-CM | POA: Insufficient documentation

## 2021-02-16 DIAGNOSIS — I10 Essential (primary) hypertension: Secondary | ICD-10-CM | POA: Diagnosis not present

## 2021-02-16 DIAGNOSIS — K219 Gastro-esophageal reflux disease without esophagitis: Secondary | ICD-10-CM | POA: Insufficient documentation

## 2021-02-16 DIAGNOSIS — R609 Edema, unspecified: Secondary | ICD-10-CM | POA: Insufficient documentation

## 2021-02-16 DIAGNOSIS — Z20822 Contact with and (suspected) exposure to covid-19: Secondary | ICD-10-CM | POA: Diagnosis not present

## 2021-02-16 DIAGNOSIS — M419 Scoliosis, unspecified: Secondary | ICD-10-CM | POA: Insufficient documentation

## 2021-02-16 DIAGNOSIS — H8110 Benign paroxysmal vertigo, unspecified ear: Secondary | ICD-10-CM | POA: Diagnosis not present

## 2021-02-16 DIAGNOSIS — Z79899 Other long term (current) drug therapy: Secondary | ICD-10-CM | POA: Diagnosis not present

## 2021-02-16 DIAGNOSIS — E119 Type 2 diabetes mellitus without complications: Secondary | ICD-10-CM | POA: Insufficient documentation

## 2021-02-16 DIAGNOSIS — Z7982 Long term (current) use of aspirin: Secondary | ICD-10-CM | POA: Insufficient documentation

## 2021-02-16 DIAGNOSIS — Z01818 Encounter for other preprocedural examination: Secondary | ICD-10-CM

## 2021-02-16 DIAGNOSIS — Z791 Long term (current) use of non-steroidal anti-inflammatories (NSAID): Secondary | ICD-10-CM | POA: Insufficient documentation

## 2021-02-16 LAB — CBC
HCT: 37.1 % (ref 36.0–46.0)
Hemoglobin: 12.8 g/dL (ref 12.0–15.0)
MCH: 28 pg (ref 26.0–34.0)
MCHC: 34.5 g/dL (ref 30.0–36.0)
MCV: 81.2 fL (ref 80.0–100.0)
Platelets: 292 10*3/uL (ref 150–400)
RBC: 4.57 MIL/uL (ref 3.87–5.11)
RDW: 13.2 % (ref 11.5–15.5)
WBC: 7.4 10*3/uL (ref 4.0–10.5)
nRBC: 0 % (ref 0.0–0.2)

## 2021-02-16 LAB — BASIC METABOLIC PANEL
Anion gap: 5 (ref 5–15)
BUN: 13 mg/dL (ref 8–23)
CO2: 28 mmol/L (ref 22–32)
Calcium: 8.9 mg/dL (ref 8.9–10.3)
Chloride: 107 mmol/L (ref 98–111)
Creatinine, Ser: 0.99 mg/dL (ref 0.44–1.00)
GFR, Estimated: 60 mL/min — ABNORMAL LOW (ref >60)
Glucose, Bld: 102 mg/dL — ABNORMAL HIGH (ref 70–99)
Potassium: 3.7 mmol/L (ref 3.5–5.1)
Sodium: 140 mmol/L (ref 135–145)

## 2021-02-16 LAB — SARS CORONAVIRUS 2 (TAT 6-24 HRS): SARS Coronavirus 2: NEGATIVE

## 2021-02-16 LAB — TYPE AND SCREEN
ABO/RH(D): A POS
Antibody Screen: NEGATIVE

## 2021-02-16 LAB — GLUCOSE, CAPILLARY: Glucose-Capillary: 102 mg/dL — ABNORMAL HIGH (ref 70–99)

## 2021-02-16 LAB — SURGICAL PCR SCREEN
MRSA, PCR: NEGATIVE
Staphylococcus aureus: NEGATIVE

## 2021-02-16 NOTE — Progress Notes (Signed)
PCP - Dr. Marlan Palau Cardiologist - denies  PPM/ICD - n/a Device Orders - n/a Rep Notified - n/a  Chest x-ray - n/a EKG - 12/25/20 Stress Test - 2011 ECHO - 2011 Cardiac Cath - denies  Sleep Study - denies   Fasting Blood Sugar - Patient states she does not check her blood sugar regularly. 102 at PAT visit.    Blood Thinner Instructions: n/a Aspirin Instructions: As of today stop taking Aspirin unless otherwise instructed by your surgeon. Patient states she has not been taking Aspirin for a couple of weeks.   ERAS Protcol - Yes PRE-SURGERY Ensure or G2- G 2  COVID TEST- 02/16/21. Pending.    Anesthesia review: Yes. EKG Review.   Patient denies shortness of breath, fever, cough and chest pain at PAT appointment   All instructions explained to the patient, with a verbal understanding of the material. Patient agrees to go over the instructions while at home for a better understanding. Patient also instructed to self quarantine after being tested for COVID-19. The opportunity to ask questions was provided.

## 2021-02-17 ENCOUNTER — Telehealth: Payer: Self-pay | Admitting: *Deleted

## 2021-02-17 NOTE — Telephone Encounter (Signed)
Ortho bundle pre-op call completed. 

## 2021-02-17 NOTE — H&P (Signed)
TOTAL KNEE ADMISSION H&P  Patient is being admitted for right total knee arthroplasty.  Subjective:  Chief Complaint:right knee pain.  HPI: Kathleen Greer, 75 y.o. female, has a history of pain and functional disability in the right knee due to arthritis and has failed non-surgical conservative treatments for greater than 12 weeks to includeNSAID's and/or analgesics, corticosteriod injections, viscosupplementation injections, flexibility and strengthening excercises, use of assistive devices, and activity modification.  Onset of symptoms was gradual, starting 3 years ago with gradually worsening course since that time. The patient noted prior procedures on the knee to include  arthroscopy on the right knee(s).  Patient currently rates pain in the right knee(s) at 10 out of 10 with activity. Patient has night pain, worsening of pain with activity and weight bearing, pain that interferes with activities of daily living, pain with passive range of motion, crepitus, and joint swelling.  Patient has evidence of subchondral sclerosis, periarticular osteophytes, and joint space narrowing by imaging studies. There is no active infection.  Patient Active Problem List   Diagnosis Date Noted   Unilateral primary osteoarthritis, left knee 06/17/2020   Unilateral primary osteoarthritis, right knee 06/17/2020   Osteoarthritis of right knee 09/15/2017   Epistaxis 07/12/2016   Impaired glucose tolerance 44/06/4740   Metabolic syndrome 59/56/3875   Anxiety 06/02/2011   Hypertension 01/12/2011   GE reflux 01/12/2011   Dependent edema 01/12/2011   Benign positional vertigo 01/12/2011   Hx of adenomatous colonic polyps 01/12/2011   Past Medical History:  Diagnosis Date   Arthritis    Blood transfusion without reported diagnosis 04/18/1968   after hysterectomy   Dependent edema    Diabetes (HCC)    Generalized headaches    GERD (gastroesophageal reflux disease)    Hyperkalemia    Hypertension    IBS  (irritable bowel syndrome)    Positional vertigo    Scoliosis     Past Surgical History:  Procedure Laterality Date   ABDOMINAL HYSTERECTOMY  1970   CARPAL TUNNEL RELEASE Right 2003   KNEE ARTHROSCOPY Right 2010   ROTATOR CUFF REPAIR Right 6/02, 3/03    No current facility-administered medications for this encounter.   Current Outpatient Medications  Medication Sig Dispense Refill Last Dose   amLODipine (NORVASC) 10 MG tablet TAKE 1 TABLET BY MOUTH  DAILY 90 tablet 3    Cholecalciferol (VITAMIN D) 50 MCG (2000 UT) tablet Take 2,000 Units by mouth daily.      diclofenac Sodium (VOLTAREN) 1 % GEL Apply 1 application topically 4 (four) times daily as needed (pain).      fluticasone (FLONASE) 50 MCG/ACT nasal spray Place 1 spray into both nostrils daily as needed for allergies or rhinitis.      glipiZIDE (GLUCOTROL) 5 MG tablet TAKE 1 TABLET BY MOUTH  BEFORE BREAKFAST AND SUPPER 180 tablet 3    losartan (COZAAR) 100 MG tablet TAKE 1 TABLET BY MOUTH  DAILY 90 tablet 3    Menthol-Methyl Salicylate (MUSCLE RUB EX) Apply 1 application topically daily as needed (pain). Care all muscle rub      metFORMIN (GLUCOPHAGE) 1000 MG tablet TAKE 1 TABLET BY MOUTH  DAILY WITH BREAKFAST 90 tablet 3    omeprazole (PRILOSEC) 20 MG capsule Take 1 capsule (20 mg total) by mouth daily. 90 capsule 3    potassium chloride SA (KLOR-CON) 20 MEQ tablet Take 1 tablet (20 mEq total) by mouth 2 (two) times daily. (Patient taking differently: Take 20 mEq by mouth daily.) 180 tablet 3  rosuvastatin (CRESTOR) 5 MG tablet Take 1 tablet (5 mg total) by mouth daily. 90 tablet 3    sodium chloride (OCEAN) 0.65 % SOLN nasal spray Place 1 spray into both nostrils as needed for congestion.      torsemide (DEMADEX) 20 MG tablet TAKE ONE OR TWO tabs daily (Patient taking differently: Take 20 mg by mouth daily.) 180 tablet 3    aspirin 81 MG tablet Take 81 mg by mouth daily.      Blood Glucose Monitoring Suppl (ONE TOUCH ULTRA 2)  w/Device KIT       gabapentin (NEURONTIN) 300 MG capsule Take 1 capsule (300 mg total) by mouth at bedtime. (Patient not taking: Reported on 02/10/2021) 90 capsule 3 Not Taking   ONE TOUCH ULTRA TEST test strip       ONETOUCH DELICA LANCETS FINE MISC       Allergies  Allergen Reactions   Penicillins     Skin irritation    Social History   Tobacco Use   Smoking status: Never   Smokeless tobacco: Never  Substance Use Topics   Alcohol use: No    Family History  Problem Relation Age of Onset   Heart disease Mother    Stroke Mother    Hypertension Mother    Heart disease Father    Heart disease Brother    Hypertension Son    Breast cancer Sister    Colon cancer Neg Hx      Review of Systems  Musculoskeletal:  Positive for gait problem and joint swelling.  All other systems reviewed and are negative.  Objective:  Physical Exam Constitutional:      Appearance: Normal appearance.  HENT:     Head: Normocephalic and atraumatic.  Eyes:     Extraocular Movements: Extraocular movements intact.     Pupils: Pupils are equal, round, and reactive to light.  Cardiovascular:     Rate and Rhythm: Normal rate and regular rhythm.  Pulmonary:     Effort: Pulmonary effort is normal.     Breath sounds: Normal breath sounds.  Abdominal:     Palpations: Abdomen is soft.  Musculoskeletal:     Cervical back: Normal range of motion and neck supple.     Right knee: Effusion, bony tenderness and crepitus present. Decreased range of motion. Tenderness present over the medial joint line, lateral joint line and patellar tendon. Abnormal alignment.  Neurological:     Mental Status: She is alert and oriented to person, place, and time.  Psychiatric:        Behavior: Behavior normal.    Vital signs in last 24 hours:    Labs:   Estimated body mass index is 31.5 kg/m as calculated from the following:   Height as of 02/16/21: 5' 3"  (1.6 m).   Weight as of 02/16/21: 80.6 kg.   Imaging  Review Plain radiographs demonstrate severe degenerative joint disease of the right knee(s). The overall alignment ismild varus. The bone quality appears to be good for age and reported activity level.      Assessment/Plan:  End stage arthritis, right knee   The patient history, physical examination, clinical judgment of the provider and imaging studies are consistent with end stage degenerative joint disease of the right knee(s) and total knee arthroplasty is deemed medically necessary. The treatment options including medical management, injection therapy arthroscopy and arthroplasty were discussed at length. The risks and benefits of total knee arthroplasty were presented and reviewed. The risks due to  aseptic loosening, infection, stiffness, patella tracking problems, thromboembolic complications and other imponderables were discussed. The patient acknowledged the explanation, agreed to proceed with the plan and consent was signed. Patient is being admitted for inpatient treatment for surgery, pain control, PT, OT, prophylactic antibiotics, VTE prophylaxis, progressive ambulation and ADL's and discharge planning. The patient is planning to be discharged home with home health services

## 2021-02-17 NOTE — Progress Notes (Signed)
Anesthesia Chart Review:  Case: [id number] Date/Time: 02/18/21 0946   Procedure: RIGHT TOTAL KNEE ARTHROPLASTY (Right: Knee)   Anesthesia type: Spinal   Pre-op diagnosis: osteoarthritis right knee   Location: [facility] OR ROOM 06 / [facility]   Surgeons: Mcarthur Rossetti, MD       DISCUSSION: Patient is a 75 year old female scheduled for the above procedure. Surgery was initially scheduled for 12/29/20, but on arrival disclosed tooth extraction for abscess 5 days prior. Decision made to postpone TKA to allow additional antibiotics and recovery from tooth abscess before proceeding.    History includes never smoker, HTN, DM2, IBS, GERD, dependent edema, scoliosis, positional vertigo, hysterectomy.  02/16/2021 presurgical COVID-19 test negative.  Anesthesia team to evaluate on the day of surgery.  VS: BP (!) 175/82   Pulse 73   Temp 36.8 C (Oral)   Resp 18   Ht _0  (1.6 m)   Wt 80.6 kg   SpO2 100%   BMI 31.50 kg/m  BP 122/68 at 01/25/21 visit with Dr. Renold Genta.  She is on amlodipine 10 mg daily, losartan 100 mg daily, and torsemide 20 mg 1-2 tablets daily.   PROVIDERS: Elby Showers, MD is PCP    LABS: Labs reviewed: Acceptable for surgery. AST/ALT normal 01/21/21. A1c 6.7% 01/21/21.  (all labs ordered are listed, but only abnormal results are displayed)  Labs Reviewed  GLUCOSE, CAPILLARY - Abnormal; Notable for the following components:      Result Value   Glucose-Capillary 102 (*)    All other components within normal limits  BASIC METABOLIC PANEL - Abnormal; Notable for the following components:   Glucose, Bld 102 (*)    GFR, Estimated 60 (*)    All other components within normal limits  SURGICAL PCR SCREEN  SARS CORONAVIRUS 2 (TAT 6-24 HRS)  CBC  TYPE AND SCREEN     EKG: 12/25/20: Normal sinus rhythm Incomplete right bundle branch block Moderate voltage criteria for LVH, may be normal variant ( R in aVL , Cornell product ) Borderline ECG No significant change since last  tracing Confirmed by Cristopher Peru [phone]) on 12/25/2020 9:55:46 PM   CV: Nuclear stress test 09/28/09: IMPRESSION:   Normal examination without evidence of pharmacologically induced  myocardial ischemia.  The calculated left ventricular ejection  fraction is 55%.   Echo 09/27/09: Study Conclusions   - Left ventricle: The cavity size was normal. Systolic function was     normal. The estimated ejection fraction was in the range of 55% to     60%. Wall motion was normal; there were no regional wall motion     abnormalities. Left ventricular diastolic function parameters were     normal.   - Aortic valve: Mild regurgitation.    Past Medical History:  Diagnosis Date   Arthritis    Blood transfusion without reported diagnosis 04/18/1968   after hysterectomy   Dependent edema    Diabetes (HCC)    Generalized headaches    GERD (gastroesophageal reflux disease)    Hyperkalemia    Hypertension    IBS (irritable bowel syndrome)    Positional vertigo    Scoliosis     Past Surgical History:  Procedure Laterality Date   ABDOMINAL HYSTERECTOMY  1970   CARPAL TUNNEL RELEASE Right 2003   KNEE ARTHROSCOPY Right 2010   ROTATOR CUFF REPAIR Right 6/02, 3/03    MEDICATIONS:  amLODipine (NORVASC) 10 MG tablet   aspirin 81 MG tablet   Blood Glucose  Monitoring Suppl (ONE TOUCH ULTRA 2) w/Device KIT   Cholecalciferol (VITAMIN D) 50 MCG (2000 UT) tablet   diclofenac Sodium (VOLTAREN) 1 % GEL   fluticasone (FLONASE) 50 MCG/ACT nasal spray   gabapentin (NEURONTIN) 300 MG capsule   glipiZIDE (GLUCOTROL) 5 MG tablet   losartan (COZAAR) 100 MG tablet   Menthol-Methyl Salicylate (MUSCLE RUB EX)   metFORMIN (GLUCOPHAGE) 1000 MG tablet   omeprazole (PRILOSEC) 20 MG capsule   ONE TOUCH ULTRA TEST test strip   ONETOUCH DELICA LANCETS FINE MISC   potassium chloride SA (KLOR-CON) 20 MEQ tablet   rosuvastatin (CRESTOR) 5 MG tablet   sodium chloride (OCEAN) 0.65 % SOLN nasal spray   torsemide  (DEMADEX) 20 MG tablet   No current facility-administered medications for this encounter.    Myra Gianotti, PA-C Surgical Short Stay/Anesthesiology [facility] Phone [phone] [facility] Phone [phone] 02/17/2021 10:05 AM

## 2021-02-17 NOTE — Care Plan (Signed)
[facility] office RNCM call to patient to discuss her upcoming R-TKA with Dr. Magnus Ivan. She was scheduled back in September and the surgery was canceled on the day of due to a tooth abscess with pulling of that tooth approximately several days prior. Rescheduled to 02/18/21. She is an [facility] patient through [facility] and is agreeable to case management. She will return home with assistance from a friend and her grandson. She has a RW and BSC/3in1 at home. No DME needed. Choice provided and referral to HHPT made with [facility]. Reviewed post op information and allowed patient to ask questions. Will continue to follow for needs.

## 2021-02-17 NOTE — Anesthesia Preprocedure Evaluation (Addendum)
Anesthesia Evaluation  Patient identified by MRN, date of birth, ID band Patient awake    Reviewed: Allergy & Precautions, NPO status , Patient's Chart, lab work & pertinent test results  Airway Mallampati: I       Dental no notable dental hx.    Pulmonary neg pulmonary ROS,    Pulmonary exam normal        Cardiovascular hypertension, Pt. on medications Normal cardiovascular exam     Neuro/Psych    GI/Hepatic GERD  Medicated and Controlled,  Endo/Other  diabetes, Type 2, Oral Hypoglycemic Agents  Renal/GU   negative genitourinary   Musculoskeletal   Abdominal (+) + obese,   Peds  Hematology negative hematology ROS (+)   Anesthesia Other Findings   Reproductive/Obstetrics                            Anesthesia Physical Anesthesia Plan  ASA: 2  Anesthesia Plan: Spinal   Post-op Pain Management:  Regional for Post-op pain   Induction:   PONV Risk Score and Plan: 2 and Ondansetron, Dexamethasone and Midazolam  Airway Management Planned: Natural Airway and Simple Face Mask  Additional Equipment: None  Intra-op Plan:   Post-operative Plan:   Informed Consent: I have reviewed the patients History and Physical, chart, labs and discussed the procedure including the risks, benefits and alternatives for the proposed anesthesia with the patient or authorized representative who has indicated his/her understanding and acceptance.       Plan Discussed with: CRNA  Anesthesia Plan Comments: (PAT note written 02/17/2021 by Shonna Chock, PA-C. )       Anesthesia Quick Evaluation

## 2021-02-18 ENCOUNTER — Observation Stay (HOSPITAL_COMMUNITY)
Admission: RE | Admit: 2021-02-18 | Discharge: 2021-02-19 | Disposition: A | Discharge status: Home / Self Care | Payer: Medicare Other | Source: Ambulatory Visit | Attending: Orthopaedic Surgery | Admitting: Orthopaedic Surgery

## 2021-02-18 ENCOUNTER — Observation Stay (HOSPITAL_COMMUNITY): Payer: Medicare Other

## 2021-02-18 ENCOUNTER — Encounter (HOSPITAL_COMMUNITY): Payer: Self-pay | Admitting: Orthopaedic Surgery

## 2021-02-18 ENCOUNTER — Encounter (HOSPITAL_COMMUNITY)
Admission: RE | Disposition: A | Discharge status: Home / Self Care | Payer: Self-pay | Source: Ambulatory Visit | Attending: Orthopaedic Surgery

## 2021-02-18 ENCOUNTER — Ambulatory Visit (HOSPITAL_COMMUNITY): Payer: Medicare Other | Admitting: Vascular Surgery

## 2021-02-18 ENCOUNTER — Other Ambulatory Visit: Payer: Self-pay

## 2021-02-18 ENCOUNTER — Ambulatory Visit (HOSPITAL_COMMUNITY): Payer: Medicare Other | Admitting: Certified Registered"

## 2021-02-18 DIAGNOSIS — G8918 Other acute postprocedural pain: Secondary | ICD-10-CM | POA: Diagnosis not present

## 2021-02-18 DIAGNOSIS — I10 Essential (primary) hypertension: Secondary | ICD-10-CM | POA: Diagnosis not present

## 2021-02-18 DIAGNOSIS — E119 Type 2 diabetes mellitus without complications: Secondary | ICD-10-CM | POA: Diagnosis not present

## 2021-02-18 DIAGNOSIS — M1711 Unilateral primary osteoarthritis, right knee: Principal | ICD-10-CM | POA: Diagnosis present

## 2021-02-18 DIAGNOSIS — Z79899 Other long term (current) drug therapy: Secondary | ICD-10-CM | POA: Insufficient documentation

## 2021-02-18 DIAGNOSIS — E875 Hyperkalemia: Secondary | ICD-10-CM | POA: Diagnosis not present

## 2021-02-18 DIAGNOSIS — Z7984 Long term (current) use of oral hypoglycemic drugs: Secondary | ICD-10-CM | POA: Insufficient documentation

## 2021-02-18 DIAGNOSIS — Z7982 Long term (current) use of aspirin: Secondary | ICD-10-CM | POA: Diagnosis not present

## 2021-02-18 DIAGNOSIS — Z96651 Presence of right artificial knee joint: Secondary | ICD-10-CM | POA: Diagnosis not present

## 2021-02-18 DIAGNOSIS — Z471 Aftercare following joint replacement surgery: Secondary | ICD-10-CM | POA: Diagnosis not present

## 2021-02-18 HISTORY — PX: TOTAL KNEE ARTHROPLASTY: SHX125

## 2021-02-18 LAB — GLUCOSE, CAPILLARY
Glucose-Capillary: 122 mg/dL — ABNORMAL HIGH (ref 70–99)
Glucose-Capillary: 98 mg/dL (ref 70–99)
Glucose-Capillary: 120 mg/dL — ABNORMAL HIGH (ref 70–99)
Glucose-Capillary: 131 mg/dL — ABNORMAL HIGH (ref 70–99)

## 2021-02-18 SURGERY — ARTHROPLASTY, KNEE, TOTAL
Anesthesia: Spinal | Site: Knee | Laterality: Right

## 2021-02-18 MED ORDER — PROPOFOL 10 MG/ML IV BOLUS
INTRAVENOUS | Status: AC
  Filled 2021-02-18: qty 20

## 2021-02-18 MED ORDER — PANTOPRAZOLE SODIUM 40 MG PO TBEC
40.0000 mg | DELAYED_RELEASE_TABLET | Freq: Every day | ORAL | Status: DC
  Administered 2021-02-19: 40 mg via ORAL
  Filled 2021-02-18: qty 1

## 2021-02-18 MED ORDER — MIDAZOLAM HCL 2 MG/2ML IJ SOLN: 2.0000 mg | Freq: Once | INTRAMUSCULAR | Status: AC

## 2021-02-18 MED ORDER — FENTANYL CITRATE (PF) 100 MCG/2ML IJ SOLN: 100.0000 ug | Freq: Once | INTRAMUSCULAR | Status: AC

## 2021-02-18 MED ORDER — 0.9 % SODIUM CHLORIDE (POUR BTL) OPTIME
TOPICAL | Status: DC | PRN
  Administered 2021-02-18: 1000 mL

## 2021-02-18 MED ORDER — ROSUVASTATIN CALCIUM 5 MG PO TABS
5.0000 mg | ORAL_TABLET | Freq: Every day | ORAL | Status: DC
  Administered 2021-02-19: 5 mg via ORAL
  Filled 2021-02-18: qty 1

## 2021-02-18 MED ORDER — CEFAZOLIN SODIUM-DEXTROSE 1-4 GM/50ML-% IV SOLN
1.0000 g | Freq: Four times a day (QID) | INTRAVENOUS | Status: AC
  Administered 2021-02-18 (×2): 1 g via INTRAVENOUS
  Filled 2021-02-18 (×2): qty 50

## 2021-02-18 MED ORDER — ORAL CARE MOUTH RINSE: 15.0000 mL | Freq: Once | OROMUCOSAL | Status: AC

## 2021-02-18 MED ORDER — POVIDONE-IODINE 10 % EX SWAB
2.0000 "application " | Freq: Once | CUTANEOUS | Status: AC
  Administered 2021-02-18: 2 via TOPICAL

## 2021-02-18 MED ORDER — PHENYLEPHRINE HCL (PRESSORS) 10 MG/ML IV SOLN
INTRAVENOUS | Status: DC | PRN
  Administered 2021-02-18 (×3): 40 ug via INTRAVENOUS

## 2021-02-18 MED ORDER — AMLODIPINE BESYLATE 10 MG PO TABS
10.0000 mg | ORAL_TABLET | Freq: Every day | ORAL | Status: DC
  Administered 2021-02-19: 10 mg via ORAL
  Filled 2021-02-18: qty 1

## 2021-02-18 MED ORDER — FENTANYL CITRATE (PF) 250 MCG/5ML IJ SOLN
INTRAMUSCULAR | Status: AC
  Filled 2021-02-18: qty 5

## 2021-02-18 MED ORDER — METHOCARBAMOL 1000 MG/10ML IJ SOLN
500.0000 mg | Freq: Four times a day (QID) | INTRAVENOUS | Status: DC | PRN
  Filled 2021-02-18: qty 5

## 2021-02-18 MED ORDER — POTASSIUM CHLORIDE CRYS ER 20 MEQ PO TBCR
20.0000 meq | EXTENDED_RELEASE_TABLET | Freq: Every day | ORAL | Status: DC
  Administered 2021-02-18 – 2021-02-19 (×2): 20 meq via ORAL
  Filled 2021-02-18 (×2): qty 1

## 2021-02-18 MED ORDER — OXYCODONE HCL 5 MG PO TABS
5.0000 mg | ORAL_TABLET | ORAL | Status: DC | PRN
  Filled 2021-02-18: qty 1
  Filled 2021-02-18 (×2): qty 2

## 2021-02-18 MED ORDER — GLIPIZIDE 5 MG PO TABS
5.0000 mg | ORAL_TABLET | Freq: Two times a day (BID) | ORAL | Status: DC
  Administered 2021-02-18 – 2021-02-19 (×2): 5 mg via ORAL
  Filled 2021-02-18 (×2): qty 1

## 2021-02-18 MED ORDER — LIDOCAINE HCL (CARDIAC) PF 100 MG/5ML IV SOSY
PREFILLED_SYRINGE | INTRAVENOUS | Status: DC | PRN
  Administered 2021-02-18: 100 mg via INTRAVENOUS

## 2021-02-18 MED ORDER — ONDANSETRON HCL 4 MG/2ML IJ SOLN
4.0000 mg | Freq: Four times a day (QID) | INTRAMUSCULAR | Status: DC | PRN
  Administered 2021-02-19: 4 mg via INTRAVENOUS
  Filled 2021-02-18 (×2): qty 2

## 2021-02-18 MED ORDER — BUPIVACAINE IN DEXTROSE 0.75-8.25 % IT SOLN
INTRATHECAL | Status: DC | PRN
  Administered 2021-02-18: 1.6 mL via INTRATHECAL

## 2021-02-18 MED ORDER — ACETAMINOPHEN 10 MG/ML IV SOLN
INTRAVENOUS | Status: DC | PRN
  Administered 2021-02-18: 15 mg via INTRAVENOUS

## 2021-02-18 MED ORDER — PHENOL 1.4 % MT LIQD: 1.0000 | OROMUCOSAL | Status: DC | PRN

## 2021-02-18 MED ORDER — METOCLOPRAMIDE HCL 5 MG PO TABS: 5.0000 mg | ORAL_TABLET | Freq: Three times a day (TID) | ORAL | Status: DC | PRN

## 2021-02-18 MED ORDER — INSULIN ASPART 100 UNIT/ML IJ SOLN
0.0000 [IU] | Freq: Three times a day (TID) | INTRAMUSCULAR | Status: DC
  Administered 2021-02-19: 5 [IU] via SUBCUTANEOUS

## 2021-02-18 MED ORDER — FENTANYL CITRATE (PF) 100 MCG/2ML IJ SOLN
INTRAMUSCULAR | Status: AC
  Administered 2021-02-18: 100 ug via INTRAVENOUS
  Filled 2021-02-18: qty 2

## 2021-02-18 MED ORDER — DOCUSATE SODIUM 100 MG PO CAPS
100.0000 mg | ORAL_CAPSULE | Freq: Two times a day (BID) | ORAL | Status: DC
  Administered 2021-02-18 – 2021-02-19 (×3): 100 mg via ORAL
  Filled 2021-02-18 (×3): qty 1

## 2021-02-18 MED ORDER — MENTHOL 3 MG MT LOZG: 1.0000 | LOZENGE | OROMUCOSAL | Status: DC | PRN

## 2021-02-18 MED ORDER — ACETAMINOPHEN 325 MG PO TABS: 325.0000 mg | ORAL_TABLET | Freq: Four times a day (QID) | ORAL | Status: DC | PRN

## 2021-02-18 MED ORDER — HYDROMORPHONE HCL 1 MG/ML IJ SOLN: 0.5000 mg | INTRAMUSCULAR | Status: DC | PRN

## 2021-02-18 MED ORDER — ZOLPIDEM TARTRATE 5 MG PO TABS: 5.0000 mg | ORAL_TABLET | Freq: Every evening | ORAL | Status: DC | PRN

## 2021-02-18 MED ORDER — MIDAZOLAM HCL 2 MG/2ML IJ SOLN
INTRAMUSCULAR | Status: AC
  Administered 2021-02-18: 2 mg via INTRAVENOUS
  Filled 2021-02-18: qty 2

## 2021-02-18 MED ORDER — VITAMIN D 25 MCG (1000 UNIT) PO TABS
2000.0000 [IU] | ORAL_TABLET | Freq: Every day | ORAL | Status: DC
  Administered 2021-02-19: 2000 [IU] via ORAL
  Filled 2021-02-18: qty 2

## 2021-02-18 MED ORDER — ONDANSETRON HCL 4 MG PO TABS: 4.0000 mg | ORAL_TABLET | Freq: Four times a day (QID) | ORAL | Status: DC | PRN

## 2021-02-18 MED ORDER — FENTANYL CITRATE (PF) 100 MCG/2ML IJ SOLN: 25.0000 ug | INTRAMUSCULAR | Status: DC | PRN

## 2021-02-18 MED ORDER — LOSARTAN POTASSIUM 50 MG PO TABS
100.0000 mg | ORAL_TABLET | Freq: Every day | ORAL | Status: DC
  Administered 2021-02-19: 100 mg via ORAL
  Filled 2021-02-18: qty 2

## 2021-02-18 MED ORDER — TRANEXAMIC ACID-NACL 1000-0.7 MG/100ML-% IV SOLN
1000.0000 mg | INTRAVENOUS | Status: AC
  Administered 2021-02-18: 1000 mg via INTRAVENOUS
  Filled 2021-02-18: qty 100

## 2021-02-18 MED ORDER — SODIUM CHLORIDE 0.9 % IV SOLN: INTRAVENOUS | Status: DC

## 2021-02-18 MED ORDER — OXYCODONE HCL 5 MG PO TABS
10.0000 mg | ORAL_TABLET | ORAL | Status: DC | PRN
  Administered 2021-02-18 (×2): 15 mg via ORAL
  Administered 2021-02-19: 10 mg via ORAL
  Administered 2021-02-19: 15 mg via ORAL
  Filled 2021-02-18 (×2): qty 3

## 2021-02-18 MED ORDER — PROPOFOL 500 MG/50ML IV EMUL
INTRAVENOUS | Status: DC | PRN
  Administered 2021-02-18: 50 ug/kg/min via INTRAVENOUS

## 2021-02-18 MED ORDER — LACTATED RINGERS IV SOLN: INTRAVENOUS | Status: DC | PRN

## 2021-02-18 MED ORDER — CLINDAMYCIN PHOSPHATE 900 MG/50ML IV SOLN
900.0000 mg | INTRAVENOUS | Status: AC
  Administered 2021-02-18: 900 mg via INTRAVENOUS
  Filled 2021-02-18: qty 50

## 2021-02-18 MED ORDER — METOCLOPRAMIDE HCL 5 MG/ML IJ SOLN: 5.0000 mg | Freq: Three times a day (TID) | INTRAMUSCULAR | Status: DC | PRN

## 2021-02-18 MED ORDER — TORSEMIDE 20 MG PO TABS
20.0000 mg | ORAL_TABLET | Freq: Every day | ORAL | Status: DC
  Administered 2021-02-19: 20 mg via ORAL
  Filled 2021-02-18: qty 1

## 2021-02-18 MED ORDER — ROPIVACAINE HCL 5 MG/ML IJ SOLN
INTRAMUSCULAR | Status: DC | PRN
  Administered 2021-02-18 (×6): 5 mL via PERINEURAL

## 2021-02-18 MED ORDER — ALUM & MAG HYDROXIDE-SIMETH 200-200-20 MG/5ML PO SUSP: 30.0000 mL | ORAL | Status: DC | PRN

## 2021-02-18 MED ORDER — METHOCARBAMOL 500 MG PO TABS
500.0000 mg | ORAL_TABLET | Freq: Four times a day (QID) | ORAL | Status: DC | PRN
  Administered 2021-02-18 – 2021-02-19 (×3): 500 mg via ORAL
  Filled 2021-02-18 (×3): qty 1

## 2021-02-18 MED ORDER — ONDANSETRON HCL 4 MG/2ML IJ SOLN: 4.0000 mg | Freq: Once | INTRAMUSCULAR | Status: DC | PRN

## 2021-02-18 MED ORDER — DIPHENHYDRAMINE HCL 12.5 MG/5ML PO ELIX: 12.5000 mg | ORAL_SOLUTION | ORAL | Status: DC | PRN

## 2021-02-18 MED ORDER — CHLORHEXIDINE GLUCONATE 0.12 % MT SOLN
15.0000 mL | Freq: Once | OROMUCOSAL | Status: AC
  Administered 2021-02-18: 15 mL via OROMUCOSAL
  Filled 2021-02-18: qty 15

## 2021-02-18 MED ORDER — SODIUM CHLORIDE 0.9 % IR SOLN
Status: DC | PRN
  Administered 2021-02-18: 1000 mL

## 2021-02-18 MED ORDER — METFORMIN HCL 500 MG PO TABS
1000.0000 mg | ORAL_TABLET | Freq: Every day | ORAL | Status: DC
  Administered 2021-02-19: 1000 mg via ORAL
  Filled 2021-02-18 (×2): qty 2

## 2021-02-18 MED ORDER — INSULIN ASPART 100 UNIT/ML IJ SOLN: 0.0000 [IU] | Freq: Every day | INTRAMUSCULAR | Status: DC

## 2021-02-18 MED ORDER — LACTATED RINGERS IV SOLN: INTRAVENOUS | Status: DC

## 2021-02-18 MED ORDER — ASPIRIN 81 MG PO CHEW
81.0000 mg | CHEWABLE_TABLET | Freq: Two times a day (BID) | ORAL | Status: DC
  Administered 2021-02-18 – 2021-02-19 (×2): 81 mg via ORAL
  Filled 2021-02-18 (×2): qty 1

## 2021-02-18 MED ORDER — ACETAMINOPHEN 10 MG/ML IV SOLN: 1000.0000 mg | Freq: Once | INTRAVENOUS | Status: DC | PRN

## 2021-02-18 SURGICAL SUPPLY — 76 items
BAG COUNTER SPONGE SURGICOUNT (BAG) ×2 IMPLANT
BAG SPNG CNTER NS LX DISP (BAG) ×1
BANDAGE ESMARK 6X9 LF (GAUZE/BANDAGES/DRESSINGS) ×1 IMPLANT
BLADE SAG 18X100X1.27 (BLADE) ×2 IMPLANT
BNDG CMPR 9X6 STRL LF SNTH (GAUZE/BANDAGES/DRESSINGS) ×1
BNDG ELASTIC 3X5.8 VLCR STR LF (GAUZE/BANDAGES/DRESSINGS) ×1 IMPLANT
BNDG ELASTIC 6X5.8 VLCR STR LF (GAUZE/BANDAGES/DRESSINGS) ×4 IMPLANT
BNDG ESMARK 6X9 LF (GAUZE/BANDAGES/DRESSINGS) ×2
BOWL SMART MIX CTS (DISPOSABLE) ×2 IMPLANT
BSPLAT TIB 5D D CMNT STM RT (Knees) ×1 IMPLANT
CEMENT BONE R 1X40 (Cement) ×2 IMPLANT
COMP FEM CMT CR PERS SZ8 RT (Joint) ×2 IMPLANT
COMPONENT FEM CMT CR PRS SZ8RT (Joint) IMPLANT
COVER SURGICAL LIGHT HANDLE (MISCELLANEOUS) ×2 IMPLANT
CUFF TOURN SGL QUICK 34 (TOURNIQUET CUFF) ×2
CUFF TOURN SGL QUICK 42 (TOURNIQUET CUFF) IMPLANT
CUFF TRNQT CYL 34X4.125X (TOURNIQUET CUFF) ×1 IMPLANT
DRAPE EXTREMITY T 121X128X90 (DISPOSABLE) ×2 IMPLANT
DRAPE HALF SHEET 40X57 (DRAPES) ×2 IMPLANT
DRAPE U-SHAPE 47X51 STRL (DRAPES) ×2 IMPLANT
DRSG PAD ABDOMINAL 8X10 ST (GAUZE/BANDAGES/DRESSINGS) ×2 IMPLANT
DRSG XEROFORM 1X8 (GAUZE/BANDAGES/DRESSINGS) ×1 IMPLANT
DURAPREP 26ML APPLICATOR (WOUND CARE) ×2 IMPLANT
ELECT CAUTERY BLADE 6.4 (BLADE) ×2 IMPLANT
ELECT REM PT RETURN 9FT ADLT (ELECTROSURGICAL) ×2
ELECTRODE REM PT RTRN 9FT ADLT (ELECTROSURGICAL) ×1 IMPLANT
FACESHIELD WRAPAROUND (MASK) ×4 IMPLANT
FACESHIELD WRAPAROUND OR TEAM (MASK) ×2 IMPLANT
GAUZE SPONGE 4X4 12PLY STRL (GAUZE/BANDAGES/DRESSINGS) ×2 IMPLANT
GAUZE SPONGE 4X4 12PLY STRL LF (GAUZE/BANDAGES/DRESSINGS) ×1 IMPLANT
GAUZE XEROFORM 1X8 LF (GAUZE/BANDAGES/DRESSINGS) ×2 IMPLANT
GLOVE SRG 8 PF TXTR STRL LF DI (GLOVE) ×2 IMPLANT
GLOVE SURG ORTHO LTX SZ7.5 (GLOVE) ×2 IMPLANT
GLOVE SURG ORTHO LTX SZ8 (GLOVE) ×2 IMPLANT
GLOVE SURG UNDER POLY LF SZ8 (GLOVE) ×4
GOWN STRL REUS W/ TWL LRG LVL3 (GOWN DISPOSABLE) IMPLANT
GOWN STRL REUS W/ TWL XL LVL3 (GOWN DISPOSABLE) ×2 IMPLANT
GOWN STRL REUS W/TWL LRG LVL3 (GOWN DISPOSABLE)
GOWN STRL REUS W/TWL XL LVL3 (GOWN DISPOSABLE) ×4
HANDPIECE INTERPULSE COAX TIP (DISPOSABLE) ×2
HDLS TROCR DRIL PIN KNEE 75 (PIN) ×2
IMMOBILIZER KNEE 22 UNIV (SOFTGOODS) ×2 IMPLANT
KIT BASIN OR (CUSTOM PROCEDURE TRAY) ×2 IMPLANT
KIT TURNOVER KIT B (KITS) ×2 IMPLANT
LINER TIB ASF PSN CD/8-9 14 RT (Liner) ×1 IMPLANT
MANIFOLD NEPTUNE II (INSTRUMENTS) ×2 IMPLANT
NDL 18GX1X1/2 (RX/OR ONLY) (NEEDLE) IMPLANT
NEEDLE 18GX1X1/2 (RX/OR ONLY) (NEEDLE) IMPLANT
NS IRRIG 1000ML POUR BTL (IV SOLUTION) ×2 IMPLANT
PACK TOTAL JOINT (CUSTOM PROCEDURE TRAY) ×2 IMPLANT
PAD ABD 8X10 STRL (GAUZE/BANDAGES/DRESSINGS) ×1 IMPLANT
PAD ARMBOARD 7.5X6 YLW CONV (MISCELLANEOUS) ×2 IMPLANT
PADDING CAST COTTON 6X4 STRL (CAST SUPPLIES) ×2 IMPLANT
PIN DRILL HDLS TROCAR 75 4PK (PIN) IMPLANT
SCREW FEMALE HEX FIX 25X2.5 (ORTHOPEDIC DISPOSABLE SUPPLIES) ×1 IMPLANT
SET HNDPC FAN SPRY TIP SCT (DISPOSABLE) ×1 IMPLANT
SET PAD KNEE POSITIONER (MISCELLANEOUS) ×2 IMPLANT
STAPLER VISISTAT 35W (STAPLE) IMPLANT
STEM POLY PAT PLY 32M KNEE (Knees) ×1 IMPLANT
STEM TIBIA 5 DEG SZ D R KNEE (Knees) IMPLANT
STRIP CLOSURE SKIN 1/2X4 (GAUZE/BANDAGES/DRESSINGS) IMPLANT
SUCTION FRAZIER HANDLE 10FR (MISCELLANEOUS) ×2
SUCTION TUBE FRAZIER 10FR DISP (MISCELLANEOUS) ×1 IMPLANT
SUT MNCRL AB 4-0 PS2 18 (SUTURE) IMPLANT
SUT VIC AB 0 CT1 27 (SUTURE) ×2
SUT VIC AB 0 CT1 27XBRD ANBCTR (SUTURE) ×1 IMPLANT
SUT VIC AB 1 CT1 27 (SUTURE) ×4
SUT VIC AB 1 CT1 27XBRD ANBCTR (SUTURE) ×2 IMPLANT
SUT VIC AB 2-0 CT1 27 (SUTURE) ×4
SUT VIC AB 2-0 CT1 TAPERPNT 27 (SUTURE) ×2 IMPLANT
SYR 50ML LL SCALE MARK (SYRINGE) IMPLANT
TIBIA STEM 5 DEG SZ D R KNEE (Knees) ×2 IMPLANT
TOWEL GREEN STERILE (TOWEL DISPOSABLE) ×2 IMPLANT
TOWEL GREEN STERILE FF (TOWEL DISPOSABLE) ×2 IMPLANT
TRAY CATH 16FR W/PLASTIC CATH (SET/KITS/TRAYS/PACK) IMPLANT
WRAP KNEE MAXI GEL POST OP (GAUZE/BANDAGES/DRESSINGS) ×2 IMPLANT

## 2021-02-18 NOTE — Op Note (Signed)
Kathleen Greer, Kathleen Greer MEDICAL RECORD NO: [id number] ACCOUNT NO: [id number] DATE OF BIRTH: 1945/10/11 FACILITY: [facility] LOCATION: [facility] PHYSICIAN: Vanita Panda. Magnus Ivan, MD  Operative Report   DATE OF PROCEDURE: 02/18/2021  PREOPERATIVE DIAGNOSIS:  Primary osteoarthritis and degenerative joint disease, right knee.  POSTOPERATIVE DIAGNOSIS:  Primary osteoarthritis and degenerative joint disease, right knee.  PROCEDURE:  Right total knee arthroplasty.  IMPLANTS:  Biomet/Zimmer Persona knee system with size 8 narrow femur, size D right tibial tray, 14 mm medial congruent fixed-bearing polyethylene insert, size 32 patellar button.  SURGEON:  Vanita Panda. Magnus Ivan, MD  ASSISTANT:  Rexene Edison, PA-C  ANESTHESIA: 1.  Right lower extremity adductor canal block. 2.  Spinal.  BLOOD LOSS:  Less than 100 mL.  COMPLICATIONS:  None.  INDICATIONS:  The patient is a 75 year old female well known to me.  She has debilitating arthritis involving her right knee.  She has tried and failed all forms of conservative treatment.  At this point, we have recommended total knee arthroplasty.  She  agrees.  We had to delay her surgery before due to a dental abscess that has since been taken care of.  She does have hypertension as well.  We had a long and thorough discussion about the risk of acute blood loss anemia, nerve or vessel injury,  fracture, infection, DVT, implant failure, and skin and soft tissue issues.  We talked about our goals being decreased pain, improved mobility, and overall improved quality of life.  DESCRIPTION OF PROCEDURE:  After informed consent was obtained, appropriate right knee was marked.  Anesthesia obtained an adductor canal block of the right lower extremity in the holding room.  She was then brought to the operating room and sat up on  the operating table.  Spinal anesthesia was obtained.  She was laid in supine position on the operating table.  Foley catheter was placed. A  nonsterile tourniquet was placed on the upper right thigh.  Her right thigh, knee, leg, ankle, and foot were  prepped and draped with DuraPrep and sterile drapes including a sterile stockinette.  A timeout was called. She was identified correct patient, correct right knee.  We then used Esmarch to wrap out the leg, and tourniquet inflated to 300 mm of pressure.   I then made a direct midline incision over the patella and carried this proximally and distally, dissected down to the knee joint, carried out a medial parapatellar arthrotomy finding a moderate joint effusion and significant osteoarthritis throughout  the right knee. With the knee in a flexed position, we removed remnants of the ACL and the medial and lateral meniscus as well as osteophytes in all 3 compartments.  We then used our extramedullary cutting guide for making our proximal tibia cut trying  to take 9 mm off the high side, correcting varus and valgus and a -3 degrees slope.  We made this cut without difficulty, and it was actually a larger cut than I expected.  We then went to the femur and decided to make a smaller femoral cut, setting this  for our intramedullary cutting guide for right knee at 5 degrees externally rotated for a 10 mm distal femoral cut.  We then backed this down 2 mm and made that cut without difficulty.  With a 12 mm extension block, we achieved full extension.  We then  put our femoral sizing guide off the epicondylar axis and chose a size 8 femur.  We put a 4-in-1 cutting block for a size 8 femur,  made our anterior and posterior cuts followed by our chamfer cuts.  We then chose a size right D tibia for coverage of the  tibial plateau setting the rotation off the tibial tubercle and the femur.  We made our keel punch and drill hole off of this.  With size D trial right tibia, we trialed a right 8 narrow femur, and we went up to a 14 mm medial congruent fixed-bearing  polyethylene insert.  We were pleased with range of  motion and stability without insert.  We then made our patellar cut and drilled 3 holes for a size 32 patellar button.  With all trial instrumentation in the knee, I put her through range of motion.  I  was pleased with stability.  We then removed all trial instrumentation from the knee and mixed our cement.  We dried the knee real well and irrigated with normal saline solution and dried it again.  Once the cement was mixed, we cemented our [employer]  Biomet size D right tibial tray followed by our size 8 narrow femur.  We placed our 14 mm medial congruent fixed-bearing polyethylene insert and cemented our size 32 patellar button.  We then held the knee completely extended and compressed the knee  while the cement hardened.  We removed excess cement debris from the knee.  I then put her through several cycles of motion once the cement had hardened.  I was pleased with range of motion and stability.  We then let the tourniquet down.  Hemostasis was  obtained with electrocautery.  We closed the arthrotomy with interrupted #1 Vicryl suture followed by 0 Vicryl to close deep tissue and 2-0 Vicryl to close subcutaneous tissue.  The skin was closed with staples.  Xeroform and well-padded sterile  dressing was applied.  She was taken to recovery room in stable condition with all final counts being correct.  No complications noted.  Of note, Rexene Edison, PA-C, did assist in entire case and assistance was crucial for facilitating all aspects of this case.   [facility] D: 02/18/2021 11:47:23 am T: 02/18/2021 11:56:00 pm  JOB: [id number]/ [id number]

## 2021-02-18 NOTE — Brief Op Note (Signed)
02/18/2021  11:49 AM  PATIENT:  Kathleen Greer  75 y.o. female  PRE-OPERATIVE DIAGNOSIS:  osteoarthritis right knee  POST-OPERATIVE DIAGNOSIS:  osteoarthritis right knee  PROCEDURE:  Procedure(s): RIGHT TOTAL KNEE ARTHROPLASTY (Right)  SURGEON:  Surgeon(s) and Role:    Kathryne Hitch, MD - Primary  PHYSICIAN ASSISTANT: Rexene Edison, PA-C  ANESTHESIA:   regional and spinal  EBL:  5 mL   COUNTS:  YES  TOURNIQUET:   Total Tourniquet Time Documented: Thigh (Right) - 46 minutes Total: Thigh (Right) - 46 minutes   DICTATION: .Other Dictation: Dictation Number [id number]  PLAN OF CARE: Admit for overnight observation  PATIENT DISPOSITION:  PACU - hemodynamically stable.   Delay start of Pharmacological VTE agent (>24hrs) due to surgical blood loss or risk of bleeding: no

## 2021-02-18 NOTE — Anesthesia Procedure Notes (Signed)
Date/Time: 02/18/2021 10:20 AM Performed by: Melina Schools, CRNA Pre-anesthesia Checklist: Patient identified, Emergency Drugs available, Suction available, Patient being monitored and Timeout performed Patient Re-evaluated:Patient Re-evaluated prior to induction Oxygen Delivery Method: Simple face mask Dental Injury: Teeth and Oropharynx as per pre-operative assessment

## 2021-02-18 NOTE — Evaluation (Signed)
Physical Therapy Evaluation Patient Details Name: Kathleen Greer MRN: [id number] DOB: 01/12/1946 Today's Date: 02/18/2021  History of Present Illness  Pt is a 75 y/o female s/p R TKA on 11/3. PMH includes HTN, vertigo, and DM.  Clinical Impression  Pt is s/p surgery above with deficits below. Pt requiring min A for transfer to chair using RW this session. Pt with episode of nausea/vomiting which limited further mobility. Educated about knee precautions. Will continue to follow acutely to maximize functional mobility independence and safety.        Recommendations for follow up therapy are one component of a multi-disciplinary discharge planning process, led by the attending physician.  Recommendations may be updated based on patient status, additional functional criteria and insurance authorization.  Follow Up Recommendations Follow physician's recommendations for discharge plan and follow up therapies    Assistance Recommended at Discharge Intermittent Supervision/Assistance  Functional Status Assessment Patient has had a recent decline in their functional status and demonstrates the ability to make significant improvements in function in a reasonable and predictable amount of time.  Equipment Recommendations  None recommended by PT    Recommendations for Other Services       Precautions / Restrictions Precautions Precautions: Knee Precaution Booklet Issued: No Precaution Comments: Verbally reviewed knee precautions. Restrictions Weight Bearing Restrictions: Yes RLE Weight Bearing: Weight bearing as tolerated      Mobility  Bed Mobility Overal bed mobility: Needs Assistance Bed Mobility: Supine to Sit     Supine to sit: Min guard     General bed mobility comments: Min guard for safety    Transfers Overall transfer level: Needs assistance Equipment used: Rolling walker (2 wheels) Transfers: Sit to/from [employer] Sit to Stand: Min assist Stand pivot  transfers: Min assist         General transfer comment: Required assist for steadying to stand and transfer to the chair. Pt with episode of nausea/vomiting after transfer and pt with tremors. Notified RN.    Ambulation/Gait                Stairs            Wheelchair Mobility    Modified Rankin (Stroke Patients Only)       Balance Overall balance assessment: Needs assistance Sitting-balance support: No upper extremity supported;Feet supported Sitting balance-Leahy Scale: Good     Standing balance support: Bilateral upper extremity supported;Reliant on assistive device for balance Standing balance-Leahy Scale: Poor Standing balance comment: Reliant on BUE support                             Pertinent Vitals/Pain Pain Assessment: Faces Faces Pain Scale: Hurts little more Pain Location: R knee Pain Descriptors / Indicators: Operative site guarding;Aching Pain Intervention(s): Limited activity within patient's tolerance;Monitored during session;Repositioned    Home Living Family/patient expects to be discharged to:: Private residence Living Arrangements: Other relatives Available Help at Discharge: Family;Available PRN/intermittently Type of Home: House Home Access: Stairs to enter Entrance Stairs-Rails: None Entrance Stairs-Number of Steps: 3   Home Layout: One level Home Equipment: Agricultural consultant (2 wheels);Cane - single point;BSC      Prior Function Prior Level of Function : Independent/Modified Independent                     Hand Dominance        Extremity/Trunk Assessment   Upper Extremity Assessment Upper Extremity Assessment: Overall WFL for  tasks assessed    Lower Extremity Assessment Lower Extremity Assessment: RLE deficits/detail RLE Deficits / Details: Deficits consistent with post op pain and weakness.    Cervical / Trunk Assessment Cervical / Trunk Assessment: Normal  Communication   Communication: No  difficulties  Cognition Arousal/Alertness: Awake/alert Behavior During Therapy: WFL for tasks assessed/performed Overall Cognitive Status: Within Functional Limits for tasks assessed                                          General Comments      Exercises     Assessment/Plan    PT Assessment Patient needs continued PT services  PT Problem List Decreased strength;Decreased range of motion;Decreased activity tolerance;Decreased balance;Decreased mobility;Decreased knowledge of use of DME;Decreased knowledge of precautions;Pain       PT Treatment Interventions DME instruction;Gait training;Stair training;Functional mobility training;Therapeutic activities;Therapeutic exercise;Balance training;Patient/family education    PT Goals (Current goals can be found in the Care Plan section)  Acute Rehab PT Goals Patient Stated Goal: to go home PT Goal Formulation: With patient Time For Goal Achievement: 03/04/21 Potential to Achieve Goals: Good    Frequency 7X/week   Barriers to discharge        Co-evaluation               AM-PAC PT "6 Clicks" Mobility  Outcome Measure Help needed turning from your back to your side while in a flat bed without using bedrails?: A Little Help needed moving from lying on your back to sitting on the side of a flat bed without using bedrails?: A Little Help needed moving to and from a bed to a chair (including a wheelchair)?: A Little Help needed standing up from a chair using your arms (e.g., wheelchair or bedside chair)?: A Little Help needed to walk in hospital room?: A Little Help needed climbing 3-5 steps with a railing? : A Lot 6 Click Score: 17    End of Session Equipment Utilized During Treatment: Gait belt Activity Tolerance: Treatment limited secondary to medical complications (Comment) (nausea/vomiting) Patient left: in chair;with call bell/phone within reach Nurse Communication: Mobility status PT Visit Diagnosis:  Other abnormalities of gait and mobility (R26.89);Pain Pain - Right/Left: Right Pain - part of body: Knee    Time: 1610-1630 PT Time Calculation (min) (ACUTE ONLY): 20 min   Charges:   PT Evaluation $PT Eval Low Complexity: 1 Low          Cindee Salt, DPT  Acute Rehabilitation Services  Pager: [phone] Office: [phone]   Lehman Prom 02/18/2021, 4:42 PM

## 2021-02-18 NOTE — Anesthesia Postprocedure Evaluation (Signed)
Anesthesia Post Note  Patient: Kathleen Greer  Procedure(s) Performed: RIGHT TOTAL KNEE ARTHROPLASTY (Right: Knee)     Patient location during evaluation: PACU Anesthesia Type: Spinal Level of consciousness: awake Pain management: pain level controlled Vital Signs Assessment: post-procedure vital signs reviewed and stable Respiratory status: spontaneous breathing Cardiovascular status: stable Postop Assessment: no headache, no backache, spinal receding, patient able to bend at knees and no apparent nausea or vomiting Anesthetic complications: no   No notable events documented.  Last Vitals:  Vitals:   02/18/21 1250 02/18/21 1316  BP: (!) 156/82 (!) 151/82  Pulse: (!) 54 (!) 58  Resp: 15 17  Temp: (!) 36.2 C   SpO2: 98% 98%    Last Pain:  Vitals:   02/18/21 1235  TempSrc:   PainSc: 0-No pain                 Caren Macadam

## 2021-02-18 NOTE — Progress Notes (Signed)
Mobility Specialist Progress Note   02/18/21 1818  Mobility  Activity Contraindicated/medical hold   Pt just got over nausea spell and didn't want to make it worse.   Frederico Hamman Mobility Specialist Phone Number [phone]'

## 2021-02-18 NOTE — Anesthesia Procedure Notes (Signed)
Anesthesia Regional Block: Adductor canal block   Pre-Anesthetic Checklist: , timeout performed,  Correct Patient, Correct Site, Correct Laterality,  Correct Procedure, Correct Position, site marked,  Risks and benefits discussed,  Surgical consent,  Pre-op evaluation,  At surgeon's request and post-op pain management  Laterality: Lower and Right  Prep: chloraprep       Needles:  Injection technique: Single-shot  Needle Type: Echogenic Stimulator Needle     Needle Length: 9cm  Needle Gauge: 20   Needle insertion depth: 3 cm   Additional Needles:   Procedures:,,,, ultrasound used (permanent image in chart),,    Narrative:  Start time: 02/18/2021 10:00 AM End time: 02/18/2021 10:10 AM Injection made incrementally with aspirations every 5 mL.  Performed by: Personally  Anesthesiologist: Leilani Able, MD

## 2021-02-18 NOTE — Anesthesia Procedure Notes (Signed)
Spinal  Patient location during procedure: OR Start time: 02/18/2021 10:20 AM End time: 02/18/2021 10:23 AM Reason for block: surgical anesthesia Staffing Performed: anesthesiologist  Anesthesiologist: Leilani Able, MD Preanesthetic Checklist Completed: patient identified, IV checked, site marked, risks and benefits discussed, surgical consent, monitors and equipment checked, pre-op evaluation and timeout performed Spinal Block Patient position: sitting Prep: DuraPrep and site prepped and draped Patient monitoring: continuous pulse ox and blood pressure Approach: midline Location: L3-4 Injection technique: single-shot Needle Needle type: Pencan  Needle gauge: 24 G Needle length: 10 cm Needle insertion depth: 5 cm Assessment Sensory level: T8 Events: CSF return

## 2021-02-18 NOTE — Discharge Instructions (Signed)

## 2021-02-18 NOTE — Interval H&P Note (Signed)
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  There has been no acute or interval change in her medical status.  Please see recent H&P.  The risks and benefits of surgery been explained in detail and informed consent is obtained.  The right knee has been marked.  02/18/2021 9:25 AM  Kathleen Greer  has presented today for surgery, with the diagnosis of osteoarthritis right knee.  The various methods of treatment have been discussed with the patient and family. After consideration of risks, benefits and other options for treatment, the patient has consented to  Procedure(s): RIGHT TOTAL KNEE ARTHROPLASTY (Right) as a surgical intervention.  The patient's history has been reviewed, patient examined, no change in status, stable for surgery.  I have reviewed the patient's chart and labs.  Questions were answered to the patient's satisfaction.     Kathryne Hitch

## 2021-02-18 NOTE — Transfer of Care (Signed)
Immediate Anesthesia Transfer of Care Note  Patient: [name]  Procedure(s) Performed: RIGHT TOTAL KNEE ARTHROPLASTY (Right: Knee)  Patient Location: PACU  Anesthesia Type:MAC and Regional  Level of Consciousness: awake, alert , oriented and patient cooperative  Airway & Oxygen Therapy: Patient Spontanous Breathing  Post-op Assessment: Report given to RN and Post -op Vital signs reviewed and stable  Post vital signs: Reviewed and stable  Last Vitals:  Vitals Value Taken Time  BP 140/78 02/18/21 1205  Temp    Pulse 55 02/18/21 1207  Resp 14 02/18/21 1207  SpO2 99 % 02/18/21 1207  Vitals shown include unvalidated device data.  Last Pain:  Vitals:   02/18/21 0842  TempSrc:   PainSc: 0-No pain         Complications: No notable events documented.

## 2021-02-18 NOTE — Plan of Care (Signed)

## 2021-02-19 ENCOUNTER — Encounter (HOSPITAL_COMMUNITY): Payer: Self-pay | Admitting: Orthopaedic Surgery

## 2021-02-19 DIAGNOSIS — Z7984 Long term (current) use of oral hypoglycemic drugs: Secondary | ICD-10-CM | POA: Diagnosis not present

## 2021-02-19 DIAGNOSIS — Z7982 Long term (current) use of aspirin: Secondary | ICD-10-CM | POA: Diagnosis not present

## 2021-02-19 DIAGNOSIS — Z79899 Other long term (current) drug therapy: Secondary | ICD-10-CM | POA: Diagnosis not present

## 2021-02-19 DIAGNOSIS — M1711 Unilateral primary osteoarthritis, right knee: Secondary | ICD-10-CM | POA: Diagnosis not present

## 2021-02-19 DIAGNOSIS — E119 Type 2 diabetes mellitus without complications: Secondary | ICD-10-CM | POA: Diagnosis not present

## 2021-02-19 LAB — CBC
HCT: 32.6 % — ABNORMAL LOW (ref 36.0–46.0)
Hemoglobin: 11.6 g/dL — ABNORMAL LOW (ref 12.0–15.0)
MCH: 28.5 pg (ref 26.0–34.0)
MCHC: 35.6 g/dL (ref 30.0–36.0)
MCV: 80.1 fL (ref 80.0–100.0)
Platelets: 258 10*3/uL (ref 150–400)
RBC: 4.07 MIL/uL (ref 3.87–5.11)
RDW: 13.3 % (ref 11.5–15.5)
WBC: 12.3 10*3/uL — ABNORMAL HIGH (ref 4.0–10.5)
nRBC: 0 % (ref 0.0–0.2)

## 2021-02-19 LAB — BASIC METABOLIC PANEL
Anion gap: 4 — ABNORMAL LOW (ref 5–15)
BUN: 12 mg/dL (ref 8–23)
CO2: 28 mmol/L (ref 22–32)
Calcium: 8.5 mg/dL — ABNORMAL LOW (ref 8.9–10.3)
Chloride: 106 mmol/L (ref 98–111)
Creatinine, Ser: 1.03 mg/dL — ABNORMAL HIGH (ref 0.44–1.00)
GFR, Estimated: 57 mL/min — ABNORMAL LOW (ref >60)
Glucose, Bld: 165 mg/dL — ABNORMAL HIGH (ref 70–99)
Potassium: 4.4 mmol/L (ref 3.5–5.1)
Sodium: 138 mmol/L (ref 135–145)

## 2021-02-19 LAB — GLUCOSE, CAPILLARY
Glucose-Capillary: 144 mg/dL — ABNORMAL HIGH (ref 70–99)
Glucose-Capillary: 208 mg/dL — ABNORMAL HIGH (ref 70–99)
Glucose-Capillary: 124 mg/dL — ABNORMAL HIGH (ref 70–99)

## 2021-02-19 MED ORDER — ASPIRIN 81 MG PO CHEW: 81.0000 mg | CHEWABLE_TABLET | Freq: Two times a day (BID) | ORAL | 0 refills | Status: DC

## 2021-02-19 MED ORDER — METHOCARBAMOL 500 MG PO TABS: 500.0000 mg | ORAL_TABLET | Freq: Four times a day (QID) | ORAL | 1 refills | Status: DC | PRN

## 2021-02-19 MED ORDER — OXYCODONE HCL 5 MG PO TABS: 5.0000 mg | ORAL_TABLET | Freq: Four times a day (QID) | ORAL | 0 refills | Status: DC | PRN

## 2021-02-19 NOTE — Progress Notes (Signed)
Physical Therapy Treatment Patient Details Name: Kathleen Greer MRN: [id number] DOB: 10/04/1945 Today's Date: 02/19/2021   History of Present Illness Pt is a 75 y/o female s/p R TKA on 11/3. PMH includes HTN, vertigo, and DM.    PT Comments    Patient progressing well this afternoon. Session focused on stair training backwards using RW requiring Min A for support. Provided handout for instruction as pt's boyfriend to assist with stair negotiation. Improved gait distance this PM with Min guard assist for balance/safety. Better able to stand from all surfaces with less assist. Plans to d/c home this afternoon. Will follow if still in the hospital.    Recommendations for follow up therapy are one component of a multi-disciplinary discharge planning process, led by the attending physician.  Recommendations may be updated based on patient status, additional functional criteria and insurance authorization.  Follow Up Recommendations  Follow physician's recommendations for discharge plan and follow up therapies     Assistance Recommended at Discharge Intermittent Supervision/Assistance  Equipment Recommendations  None recommended by PT    Recommendations for Other Services       Precautions / Restrictions Precautions Precautions: Knee Precaution Booklet Issued: No Precaution Comments: Verbally reviewed knee precautions. Restrictions Weight Bearing Restrictions: Yes RLE Weight Bearing: Weight bearing as tolerated     Mobility  Bed Mobility Overal bed mobility: Needs Assistance Bed Mobility: Supine to Sit;Sit to Supine     Supine to sit: Min assist;HOB elevated Sit to supine: Supervision;HOB elevated   General bed mobility comments: Assist with RLE to get to EOB., increased time and use of rails. ABle to bring RLE into bed by hooking LLE under LE    Transfers Overall transfer level: Needs assistance Equipment used: Rolling walker (2 wheels) Transfers: Sit to/from Stand Sit to  Stand: Min assist;Min guard           General transfer comment: Min A to power to standing from EOB x1, Min guard from chair x2 with cues for hand placement.    Ambulation/Gait Ambulation/Gait assistance: Min guard;Supervision Gait Distance (Feet): 100 Feet (x2 bouts) Assistive device: Rolling walker (2 wheels) Gait Pattern/deviations: Step-to pattern;Decreased stance time - right;Step-through pattern;Decreased step length - left Gait velocity: decreased   General Gait Details: Slow, mostly steady gait wtih cues for knee extension during stance phase on right and step through gait. 1 seated rest break.   Stairs Stairs: Yes Stairs assistance: Min assist Stair Management: Backwards;With walker Number of Stairs: 2 General stair comments: Cues for technique with therapist stabilizing RW on stairs. Provided handout to give b/f who will be assisting.   Wheelchair Mobility    Modified Rankin (Stroke Patients Only)       Balance Overall balance assessment: Needs assistance Sitting-balance support: No upper extremity supported;Feet supported Sitting balance-Leahy Scale: Good     Standing balance support: During functional activity Standing balance-Leahy Scale: Poor Standing balance comment: Reliant on BUE support                            Cognition Arousal/Alertness: Awake/alert Behavior During Therapy: WFL for tasks assessed/performed Overall Cognitive Status: Within Functional Limits for tasks assessed                                          Exercises      General Comments  Pertinent Vitals/Pain Pain Assessment: No/denies pain Pain Score: 6  Pain Location: R knee Pain Descriptors / Indicators: Operative site guarding;Aching;Sore Pain Intervention(s): Monitored during session;Premedicated before session;Repositioned    Home Living                          Prior Function            PT Goals (current goals  can now be found in the care plan section) Progress towards PT goals: Progressing toward goals    Frequency    7X/week      PT Plan Current plan remains appropriate    Co-evaluation              AM-PAC PT "6 Clicks" Mobility   Outcome Measure  Help needed turning from your back to your side while in a flat bed without using bedrails?: A Little Help needed moving from lying on your back to sitting on the side of a flat bed without using bedrails?: A Little Help needed moving to and from a bed to a chair (including a wheelchair)?: A Little Help needed standing up from a chair using your arms (e.g., wheelchair or bedside chair)?: A Little Help needed to walk in hospital room?: A Little Help needed climbing 3-5 steps with a railing? : A Little 6 Click Score: 18    End of Session Equipment Utilized During Treatment: Gait belt Activity Tolerance: Patient tolerated treatment well Patient left: in bed;with call bell/phone within reach;with bed alarm set Nurse Communication: Mobility status PT Visit Diagnosis: Other abnormalities of gait and mobility (R26.89);Pain Pain - Right/Left: Right Pain - part of body: Knee     Time: 5916-3846 PT Time Calculation (min) (ACUTE ONLY): 30 min  Charges:  $Gait Training: 23-37 mins                     Vale Haven, PT, DPT Acute Rehabilitation Services Pager [phone] Office [phone]      Blake Divine A Lanier Ensign 02/19/2021, 2:09 PM

## 2021-02-19 NOTE — Progress Notes (Signed)
Subjective: 1 Day Post-Op Procedure(s) (LRB): RIGHT TOTAL KNEE ARTHROPLASTY (Right) Patient reports pain as moderate.    Objective: Vital signs in last 24 hours: Temp:  [97.1 F (36.2 C)-98.8 F (37.1 C)] 98.8 F (37.1 C) (11/04 0500) Pulse Rate:  [54-78] 69 (11/04 0500) Resp:  [11-20] 16 (11/04 0500) BP: (133-192)/(71-91) 133/71 (11/04 0500) SpO2:  [94 %-98 %] 95 % (11/04 0500) Weight:  [80.3 kg] 80.3 kg (11/03 0814)  Intake/Output from previous day: 11/03 0701 - 11/04 0700 In: 2076.3 [P.O.:240; I.V.:1836.3] Out: 3230 [Urine:3025; Emesis/NG output:200; Blood:5] Intake/Output this shift: Total I/O In: 560.9 [I.V.:560.9] Out: 925 [Urine:925]  Recent Labs    02/16/21 0920 02/19/21 0304  HGB 12.8 11.6*   Recent Labs    02/16/21 0920 02/19/21 0304  WBC 7.4 12.3*  RBC 4.57 4.07  HCT 37.1 32.6*  PLT 292 258   Recent Labs    02/16/21 0920 02/19/21 0304  NA 140 138  K 3.7 4.4  CL 107 106  CO2 28 28  BUN 13 12  CREATININE 0.99 1.03*  GLUCOSE 102* 165*  CALCIUM 8.9 8.5*   No results for input(s): LABPT, INR in the last 72 hours.  Sensation intact distally Intact pulses distally Dorsiflexion/Plantar flexion intact Incision: dressing C/D/I No cellulitis present Compartment soft   Assessment/Plan: 1 Day Post-Op Procedure(s) (LRB): RIGHT TOTAL KNEE ARTHROPLASTY (Right) Up with therapy Discharge home with home health this afternoon if clears PT.      Kathryne Hitch 02/19/2021, 6:42 AM

## 2021-02-19 NOTE — Care Plan (Signed)
Ortho Bundle Case Management Note  Patient Details  Name: Kathleen Greer MRN: [id number] Date of Birth: 05-Jun-1945  [facility] office RNCM call to patient to discuss her upcoming R-TKA with Dr. Magnus Ivan. She was scheduled back in September and the surgery was canceled on the day of due to a tooth abscess with pulling of that tooth approximately several days prior. Rescheduled to 02/18/21. She is an Ortho bundle patient through [facility] and is agreeable to case management. She will return home with assistance from a friend and her grandson. She has a RW and BSC/3in1 at home. No DME needed. Choice provided and referral to HHPT made with [facility]. Reviewed post op information and allowed patient to ask questions. Will continue to follow for needs.                   DME Arranged:   (Patient reports she has all needed DME at home (RW, elevated toilet seats)) DME Agency:     HH Arranged:  PT HH Agency:  [facility]  Additional Comments: Please contact me with any questions of if this plan should need to change.  Ralph Dowdy, RN, BSN, [employer]  [phone] 02/19/2021, 9:12 AM

## 2021-02-19 NOTE — Progress Notes (Signed)
Physical Therapy Treatment Patient Details Name: Kathleen Greer MRN: [id number] DOB: Nov 29, 1945 Today's Date: 02/19/2021   History of Present Illness Pt is a 75 y/o female s/p R TKA on 11/3. PMH includes HTN, vertigo, and DM.    PT Comments    Patient progressing well with mobility. Requires min A for bed mobility and Min-Mod A to stand from different level surfaces with cues. Tolerated short distance ambulation with use of RW for support with 1 instance of LOB at sink but able to self correct. Gait limited due to bleeding of incision during ambulation. RN notified. Knee flexion AROM 7-65 degrees. Instructed pt in there ex. Will plan for increasing gait distance and stair training this PM to prepare pt for d/c home. Will follow.   Recommendations for follow up therapy are one component of a multi-disciplinary discharge planning process, led by the attending physician.  Recommendations may be updated based on patient status, additional functional criteria and insurance authorization.  Follow Up Recommendations  Follow physician's recommendations for discharge plan and follow up therapies     Assistance Recommended at Discharge Intermittent Supervision/Assistance  Equipment Recommendations  None recommended by PT    Recommendations for Other Services       Precautions / Restrictions Precautions Precautions: Knee Precaution Booklet Issued: No Precaution Comments: Verbally reviewed knee precautions. Restrictions Weight Bearing Restrictions: Yes RLE Weight Bearing: Weight bearing as tolerated     Mobility  Bed Mobility Overal bed mobility: Needs Assistance Bed Mobility: Supine to Sit     Supine to sit: Min assist;HOB elevated     General bed mobility comments: Assist with RLE to get to EOB., increased time and use of rails.    Transfers Overall transfer level: Needs assistance Equipment used: Rolling walker (2 wheels) Transfers: Sit to/from Stand Sit to Stand: Min  assist;Mod assist           General transfer comment: Min A to power to standing from EOB x1, Mod A from low toilet using grab bar x1, transferred to chair post ambulation.    Ambulation/Gait Ambulation/Gait assistance: Min guard Gait Distance (Feet): 16 Feet (x2 bouts) Assistive device: Rolling walker (2 wheels) Gait Pattern/deviations: Step-to pattern;Decreased stance time - right;Step-through pattern;Decreased step length - left Gait velocity: decreased   General Gait Details: Slow, mildly unsteady gait with decreased knee extension during stance phase. 1 partial LOB but able to self correct on own. 1 seated rest break on toilet. Distance limited due to bleeding at incision- RN notified and came to change bandage.   Stairs             Wheelchair Mobility    Modified Rankin (Stroke Patients Only)       Balance Overall balance assessment: Needs assistance Sitting-balance support: No upper extremity supported;Feet supported Sitting balance-Leahy Scale: Good Sitting balance - Comments: Able to perform pericare without difficulty.   Standing balance support: During functional activity Standing balance-Leahy Scale: Poor Standing balance comment: Reliant on BUE support, able to remove UEs from RW to wash hands for short times however mild instability and LOB but able to self correct.                            Cognition Arousal/Alertness: Awake/alert Behavior During Therapy: WFL for tasks assessed/performed Overall Cognitive Status: Within Functional Limits for tasks assessed  Exercises Total Joint Exercises Ankle Circles/Pumps: AROM;Both;10 reps;Supine Quad Sets: AROM;Both;10 reps;Supine (3 sec hold) Heel Slides: AROM;Right;Seated (x3) Goniometric ROM: 7-65 degrees knee AROM    General Comments General comments (skin integrity, edema, etc.): Bleeding from incision when walking to bathroom, RN  notified and came to change bandage.      Pertinent Vitals/Pain Pain Assessment: 0-10 Pain Score: 6  Pain Location: R knee Pain Descriptors / Indicators: Operative site guarding;Aching;Sore Pain Intervention(s): Monitored during session;Premedicated before session;Repositioned    Home Living                          Prior Function            PT Goals (current goals can now be found in the care plan section) Progress towards PT goals: Progressing toward goals    Frequency    7X/week      PT Plan Current plan remains appropriate    Co-evaluation              AM-PAC PT "6 Clicks" Mobility   Outcome Measure  Help needed turning from your back to your side while in a flat bed without using bedrails?: A Little Help needed moving from lying on your back to sitting on the side of a flat bed without using bedrails?: A Little Help needed moving to and from a bed to a chair (including a wheelchair)?: A Little Help needed standing up from a chair using your arms (e.g., wheelchair or bedside chair)?: A Lot Help needed to walk in hospital room?: A Little Help needed climbing 3-5 steps with a railing? : A Lot 6 Click Score: 16    End of Session Equipment Utilized During Treatment: Gait belt Activity Tolerance: Patient tolerated treatment well;Other (comment) (bleeding from incision) Patient left: in chair;with call bell/phone within reach;with nursing/sitter in room Nurse Communication: Mobility status;Other (comment) (bleeding incision) PT Visit Diagnosis: Other abnormalities of gait and mobility (R26.89);Pain Pain - Right/Left: Right Pain - part of body: Knee     Time: 5573-2202 PT Time Calculation (min) (ACUTE ONLY): 33 min  Charges:  $Gait Training: 8-22 mins $Therapeutic Activity: 8-22 mins                     Vale Haven, PT, DPT Acute Rehabilitation Services Pager [phone] Office [phone]      Blake Divine A [place] Deleo 02/19/2021, 8:22  AM

## 2021-02-19 NOTE — Progress Notes (Signed)
Pt Is ambulating with the walker. Right knee incision was bleeding this morning, bleeding stopped after applying some pressure. New aquacel dressing applie. Pain is controlled with oral narcotics. Pt is ready to go home. Discharge instructions given to pt. Discharged to home accompanied by a friend.

## 2021-02-19 NOTE — Progress Notes (Signed)
   02/19/21 1400  Mobility  Activity Refused mobility (Declined d/t just working with therapy)

## 2021-02-20 DIAGNOSIS — Z96651 Presence of right artificial knee joint: Secondary | ICD-10-CM | POA: Diagnosis not present

## 2021-02-20 DIAGNOSIS — M75101 Unspecified rotator cuff tear or rupture of right shoulder, not specified as traumatic: Secondary | ICD-10-CM | POA: Diagnosis not present

## 2021-02-20 DIAGNOSIS — K219 Gastro-esophageal reflux disease without esophagitis: Secondary | ICD-10-CM | POA: Diagnosis not present

## 2021-02-20 DIAGNOSIS — Z471 Aftercare following joint replacement surgery: Secondary | ICD-10-CM | POA: Diagnosis not present

## 2021-02-20 DIAGNOSIS — Z7982 Long term (current) use of aspirin: Secondary | ICD-10-CM | POA: Diagnosis not present

## 2021-02-20 DIAGNOSIS — I10 Essential (primary) hypertension: Secondary | ICD-10-CM | POA: Diagnosis not present

## 2021-02-20 DIAGNOSIS — K589 Irritable bowel syndrome without diarrhea: Secondary | ICD-10-CM | POA: Diagnosis not present

## 2021-02-20 DIAGNOSIS — E119 Type 2 diabetes mellitus without complications: Secondary | ICD-10-CM | POA: Diagnosis not present

## 2021-02-20 DIAGNOSIS — G9341 Metabolic encephalopathy: Secondary | ICD-10-CM | POA: Diagnosis not present

## 2021-02-20 DIAGNOSIS — Z7984 Long term (current) use of oral hypoglycemic drugs: Secondary | ICD-10-CM | POA: Diagnosis not present

## 2021-02-20 DIAGNOSIS — Z9181 History of falling: Secondary | ICD-10-CM | POA: Diagnosis not present

## 2021-02-20 NOTE — Discharge Summary (Signed)
Patient ID: Brendolyn Stockley MRN: [id number] DOB/AGE: 74-26-1947 74 y.o.  Admit date: 02/18/2021 Discharge date: 02/20/2021  Admission Diagnoses:  Principal Problem:   Unilateral primary osteoarthritis, right knee Active Problems:   Status post total right knee replacement   Discharge Diagnoses:  Same  Past Medical History:  Diagnosis Date   Arthritis    Blood transfusion without reported diagnosis 04/18/1968   after hysterectomy   Dependent edema    Diabetes (HCC)    Generalized headaches    GERD (gastroesophageal reflux disease)    Hyperkalemia    Hypertension    IBS (irritable bowel syndrome)    Positional vertigo    Scoliosis     Surgeries: Procedure(s): RIGHT TOTAL KNEE ARTHROPLASTY on 02/18/2021   Consultants:   Discharged Condition: Improved  Hospital Course: Danae Oland is an 75 y.o. female who was admitted 02/18/2021 for operative treatment ofUnilateral primary osteoarthritis, right knee. Patient has severe unremitting pain that affects sleep, daily activities, and work/hobbies. After pre-op clearance the patient was taken to the operating room on 02/18/2021 and underwent  Procedure(s): RIGHT TOTAL KNEE ARTHROPLASTY.    Patient was given perioperative antibiotics:  Anti-infectives (From admission, onward)    Start     Dose/Rate Route Frequency Ordered Stop   02/18/21 1600  ceFAZolin (ANCEF) IVPB 1 g/50 mL premix        1 g 100 mL/hr over 30 Minutes Intravenous Every 6 hours 02/18/21 1314 02/18/21 2256   02/18/21 0815  clindamycin (CLEOCIN) IVPB 900 mg        900 mg 100 mL/hr over 30 Minutes Intravenous On call to O.R. 02/18/21 0813 02/18/21 1039        Patient was given sequential compression devices, early ambulation, and chemoprophylaxis to prevent DVT.  Patient benefited maximally from hospital stay and there were no complications.    Recent vital signs: Patient Vitals for the past 24 hrs:  BP Temp Temp src Pulse Resp SpO2  02/19/21 1145 140/72  98.3 F (36.8 C) Oral 68 20 100 %  02/19/21 0834 (!) 152/93 98.6 F (37 C) Oral 73 19 97 %  02/19/21 0753 (!) 152/93 98.6 F (37 C) -- 73 19 97 %     Recent laboratory studies:  Recent Labs    02/19/21 0304  WBC 12.3*  HGB 11.6*  HCT 32.6*  PLT 258  NA 138  K 4.4  CL 106  CO2 28  BUN 12  CREATININE 1.03*  GLUCOSE 165*  CALCIUM 8.5*     Discharge Medications:   Allergies as of 02/19/2021       Reactions   Penicillins    Skin irritation        Medication List     STOP taking these medications    aspirin 81 MG tablet Replaced by: aspirin 81 MG chewable tablet       TAKE these medications    amLODipine 10 MG tablet Commonly known as: NORVASC TAKE 1 TABLET BY MOUTH  DAILY   aspirin 81 MG chewable tablet Chew 1 tablet (81 mg total) by mouth 2 (two) times daily. Replaces: aspirin 81 MG tablet   diclofenac Sodium 1 % Gel Commonly known as: VOLTAREN Apply 1 application topically 4 (four) times daily as needed (pain).   fluticasone 50 MCG/ACT nasal spray Commonly known as: FLONASE Place 1 spray into both nostrils daily as needed for allergies or rhinitis.   glipiZIDE 5 MG tablet Commonly known as: GLUCOTROL TAKE 1 TABLET BY MOUTH  BEFORE  BREAKFAST AND SUPPER   losartan 100 MG tablet Commonly known as: COZAAR TAKE 1 TABLET BY MOUTH  DAILY   metFORMIN 1000 MG tablet Commonly known as: GLUCOPHAGE TAKE 1 TABLET BY MOUTH  DAILY WITH BREAKFAST   methocarbamol 500 MG tablet Commonly known as: ROBAXIN Take 1 tablet (500 mg total) by mouth every 6 (six) hours as needed for muscle spasms.   MUSCLE RUB EX Apply 1 application topically daily as needed (pain). Care all muscle rub   omeprazole 20 MG capsule Commonly known as: PRILOSEC Take 1 capsule (20 mg total) by mouth daily.   ONE TOUCH ULTRA 2 w/Device Kit   ONE TOUCH ULTRA TEST test strip Generic drug: glucose blood   OneTouch Delica Lancets Fine Misc   oxyCODONE 5 MG immediate release  tablet Commonly known as: Oxy IR/ROXICODONE Take 1-2 tablets (5-10 mg total) by mouth every 6 (six) hours as needed for moderate pain (pain score 4-6). No more than 6 tablets per day.   potassium chloride SA 20 MEQ tablet Commonly known as: KLOR-CON Take 1 tablet (20 mEq total) by mouth 2 (two) times daily. What changed: when to take this   rosuvastatin 5 MG tablet Commonly known as: Crestor Take 1 tablet (5 mg total) by mouth daily.   sodium chloride 0.65 % Soln nasal spray Commonly known as: OCEAN Place 1 spray into both nostrils as needed for congestion.   torsemide 20 MG tablet Commonly known as: DEMADEX TAKE ONE OR TWO tabs daily What changed:  how much to take how to take this when to take this additional instructions   Vitamin D 50 MCG (2000 UT) tablet Take 2,000 Units by mouth daily.        Diagnostic Studies: DG Knee Right Port  Result Date: 02/18/2021 CLINICAL DATA:  Status post knee replacement EXAM: PORTABLE RIGHT KNEE - 1-2 VIEW COMPARISON:  Right knee x-ray 06/17/2020 FINDINGS: Status post total right knee arthroplasty. The hardware appears aligned and intact. No acute fracture or dislocation identified. Subcutaneous emphysema. Overlying surgical staples anteriorly. IMPRESSION: Total right knee arthroplasty changes. Electronically Signed   By: Ofilia Neas M.D.   On: 02/18/2021 12:54    Disposition: Discharge disposition: 01-Home or Self Care          Follow-up Information     Mcarthur Rossetti, MD. Go on 03/03/2021.   Specialty: Orthopedic Surgery Why: at 9:15 am for your 2 week in office appointment with Dr. Clarita Leber information: [place] [address] [phone]         [facility] [place] Follow up.   Specialty: [facility] Contact information: [place] [address] [phone]                  Signed: Mcarthur Rossetti 02/20/2021, 7:52 AM

## 2021-02-22 DIAGNOSIS — Z7984 Long term (current) use of oral hypoglycemic drugs: Secondary | ICD-10-CM | POA: Diagnosis not present

## 2021-02-22 DIAGNOSIS — Z9181 History of falling: Secondary | ICD-10-CM | POA: Diagnosis not present

## 2021-02-22 DIAGNOSIS — K589 Irritable bowel syndrome without diarrhea: Secondary | ICD-10-CM | POA: Diagnosis not present

## 2021-02-22 DIAGNOSIS — Z7982 Long term (current) use of aspirin: Secondary | ICD-10-CM | POA: Diagnosis not present

## 2021-02-22 DIAGNOSIS — Z96651 Presence of right artificial knee joint: Secondary | ICD-10-CM | POA: Diagnosis not present

## 2021-02-22 DIAGNOSIS — Z471 Aftercare following joint replacement surgery: Secondary | ICD-10-CM | POA: Diagnosis not present

## 2021-02-22 DIAGNOSIS — E119 Type 2 diabetes mellitus without complications: Secondary | ICD-10-CM | POA: Diagnosis not present

## 2021-02-22 DIAGNOSIS — G9341 Metabolic encephalopathy: Secondary | ICD-10-CM | POA: Diagnosis not present

## 2021-02-22 DIAGNOSIS — K219 Gastro-esophageal reflux disease without esophagitis: Secondary | ICD-10-CM | POA: Diagnosis not present

## 2021-02-22 DIAGNOSIS — I10 Essential (primary) hypertension: Secondary | ICD-10-CM | POA: Diagnosis not present

## 2021-02-22 DIAGNOSIS — M75101 Unspecified rotator cuff tear or rupture of right shoulder, not specified as traumatic: Secondary | ICD-10-CM | POA: Diagnosis not present

## 2021-02-22 NOTE — Discharge Summary (Signed)
Patient ID: Kathleen Greer MRN: [id number] DOB/AGE: 11-06-1945 75 y.o.  Admit date: 02/18/2021 Discharge date: 02/19/2021  Admission Diagnoses:  Principal Problem:   Unilateral primary osteoarthritis, right knee Active Problems:   Status post total right knee replacement   Discharge Diagnoses:  Same  Past Medical History:  Diagnosis Date   Arthritis    Blood transfusion without reported diagnosis 04/18/1968   after hysterectomy   Dependent edema    Diabetes (HCC)    Generalized headaches    GERD (gastroesophageal reflux disease)    Hyperkalemia    Hypertension    IBS (irritable bowel syndrome)    Positional vertigo    Scoliosis     Surgeries: Procedure(s): RIGHT TOTAL KNEE ARTHROPLASTY on 02/18/2021   Consultants:   Discharged Condition: Improved  Hospital Course: Kathleen Greer is an 75 y.o. female who was admitted 02/18/2021 for operative treatment ofUnilateral primary osteoarthritis, right knee. Patient has severe unremitting pain that affects sleep, daily activities, and work/hobbies. After pre-op clearance the patient was taken to the operating room on 02/18/2021 and underwent  Procedure(s): RIGHT TOTAL KNEE ARTHROPLASTY.    Patient was given perioperative antibiotics:  Anti-infectives (From admission, onward)    Start     Dose/Rate Route Frequency Ordered Stop   02/18/21 1600  ceFAZolin (ANCEF) IVPB 1 g/50 mL premix        1 g 100 mL/hr over 30 Minutes Intravenous Every 6 hours 02/18/21 1314 02/18/21 2256   02/18/21 0815  clindamycin (CLEOCIN) IVPB 900 mg        900 mg 100 mL/hr over 30 Minutes Intravenous On call to O.R. 02/18/21 0813 02/18/21 1039        Patient was given sequential compression devices, early ambulation, and chemoprophylaxis to prevent DVT.  Patient benefited maximally from hospital stay and there were no complications.    Recent vital signs: No data found.   Recent laboratory studies: No results for input(s): WBC, HGB, HCT, PLT, NA, K,  CL, CO2, BUN, CREATININE, GLUCOSE, INR, CALCIUM in the last 72 hours.  Invalid input(s): PT, 2   Discharge Medications:   Allergies as of 02/19/2021       Reactions   Penicillins    Skin irritation        Medication List     STOP taking these medications    aspirin 81 MG tablet Replaced by: aspirin 81 MG chewable tablet       TAKE these medications    amLODipine 10 MG tablet Commonly known as: NORVASC TAKE 1 TABLET BY MOUTH  DAILY   aspirin 81 MG chewable tablet Chew 1 tablet (81 mg total) by mouth 2 (two) times daily. Replaces: aspirin 81 MG tablet   diclofenac Sodium 1 % Gel Commonly known as: VOLTAREN Apply 1 application topically 4 (four) times daily as needed (pain).   fluticasone 50 MCG/ACT nasal spray Commonly known as: FLONASE Place 1 spray into both nostrils daily as needed for allergies or rhinitis.   glipiZIDE 5 MG tablet Commonly known as: GLUCOTROL TAKE 1 TABLET BY MOUTH  BEFORE BREAKFAST AND SUPPER   losartan 100 MG tablet Commonly known as: COZAAR TAKE 1 TABLET BY MOUTH  DAILY   metFORMIN 1000 MG tablet Commonly known as: GLUCOPHAGE TAKE 1 TABLET BY MOUTH  DAILY WITH BREAKFAST   methocarbamol 500 MG tablet Commonly known as: ROBAXIN Take 1 tablet (500 mg total) by mouth every 6 (six) hours as needed for muscle spasms.   MUSCLE RUB EX Apply 1 application  topically daily as needed (pain). Care all muscle rub   omeprazole 20 MG capsule Commonly known as: PRILOSEC Take 1 capsule (20 mg total) by mouth daily.   ONE TOUCH ULTRA 2 w/Device Kit   ONE TOUCH ULTRA TEST test strip Generic drug: glucose blood   OneTouch Delica Lancets Fine Misc   oxyCODONE 5 MG immediate release tablet Commonly known as: Oxy IR/ROXICODONE Take 1-2 tablets (5-10 mg total) by mouth every 6 (six) hours as needed for moderate pain (pain score 4-6). No more than 6 tablets per day.   potassium chloride SA 20 MEQ tablet Commonly known as: KLOR-CON Take 1  tablet (20 mEq total) by mouth 2 (two) times daily. What changed: when to take this   rosuvastatin 5 MG tablet Commonly known as: Crestor Take 1 tablet (5 mg total) by mouth daily.   sodium chloride 0.65 % Soln nasal spray Commonly known as: OCEAN Place 1 spray into both nostrils as needed for congestion.   torsemide 20 MG tablet Commonly known as: DEMADEX TAKE ONE OR TWO tabs daily What changed:  how much to take how to take this when to take this additional instructions   Vitamin D 50 MCG (2000 UT) tablet Take 2,000 Units by mouth daily.        Diagnostic Studies: DG Knee Right Port  Result Date: 02/18/2021 CLINICAL DATA:  Status post knee replacement EXAM: PORTABLE RIGHT KNEE - 1-2 VIEW COMPARISON:  Right knee x-ray 06/17/2020 FINDINGS: Status post total right knee arthroplasty. The hardware appears aligned and intact. No acute fracture or dislocation identified. Subcutaneous emphysema. Overlying surgical staples anteriorly. IMPRESSION: Total right knee arthroplasty changes. Electronically Signed   By: Ofilia Neas M.D.   On: 02/18/2021 12:54    Disposition: Discharge disposition: 01-Home or Self Care          Follow-up Information     Mcarthur Rossetti, MD. Go on 03/03/2021.   Specialty: Orthopedic Surgery Why: at 9:15 am for your 2 week in office appointment with Dr. Clarita Leber information: [place] [address] [phone]         [facility] [place] Follow up.   Specialty: [facility] Contact information: [place] [address] [phone]                  Signed: Mcarthur Rossetti 02/22/2021, 12:30 PM

## 2021-02-23 ENCOUNTER — Telehealth: Payer: Self-pay | Admitting: *Deleted

## 2021-02-23 NOTE — Telephone Encounter (Signed)
Ortho bundle d/c call completed. 

## 2021-02-23 NOTE — Telephone Encounter (Signed)
Attempted Ortho bundle D/C call to patient. No answer and left VM requesting call back. °

## 2021-02-24 DIAGNOSIS — Z471 Aftercare following joint replacement surgery: Secondary | ICD-10-CM | POA: Diagnosis not present

## 2021-02-24 DIAGNOSIS — G9341 Metabolic encephalopathy: Secondary | ICD-10-CM | POA: Diagnosis not present

## 2021-02-24 DIAGNOSIS — E119 Type 2 diabetes mellitus without complications: Secondary | ICD-10-CM | POA: Diagnosis not present

## 2021-02-24 DIAGNOSIS — Z96651 Presence of right artificial knee joint: Secondary | ICD-10-CM | POA: Diagnosis not present

## 2021-02-24 DIAGNOSIS — M75101 Unspecified rotator cuff tear or rupture of right shoulder, not specified as traumatic: Secondary | ICD-10-CM | POA: Diagnosis not present

## 2021-02-24 DIAGNOSIS — Z7984 Long term (current) use of oral hypoglycemic drugs: Secondary | ICD-10-CM | POA: Diagnosis not present

## 2021-02-24 DIAGNOSIS — I10 Essential (primary) hypertension: Secondary | ICD-10-CM | POA: Diagnosis not present

## 2021-02-24 DIAGNOSIS — Z7982 Long term (current) use of aspirin: Secondary | ICD-10-CM | POA: Diagnosis not present

## 2021-02-24 DIAGNOSIS — K589 Irritable bowel syndrome without diarrhea: Secondary | ICD-10-CM | POA: Diagnosis not present

## 2021-02-24 DIAGNOSIS — K219 Gastro-esophageal reflux disease without esophagitis: Secondary | ICD-10-CM | POA: Diagnosis not present

## 2021-02-24 DIAGNOSIS — Z9181 History of falling: Secondary | ICD-10-CM | POA: Diagnosis not present

## 2021-02-26 ENCOUNTER — Telehealth: Payer: Self-pay | Admitting: *Deleted

## 2021-02-26 NOTE — Telephone Encounter (Signed)
Ortho bundle 7 day call completed. 

## 2021-03-01 DIAGNOSIS — Z471 Aftercare following joint replacement surgery: Secondary | ICD-10-CM | POA: Diagnosis not present

## 2021-03-01 DIAGNOSIS — Z96651 Presence of right artificial knee joint: Secondary | ICD-10-CM | POA: Diagnosis not present

## 2021-03-01 DIAGNOSIS — Z7982 Long term (current) use of aspirin: Secondary | ICD-10-CM | POA: Diagnosis not present

## 2021-03-01 DIAGNOSIS — E119 Type 2 diabetes mellitus without complications: Secondary | ICD-10-CM | POA: Diagnosis not present

## 2021-03-01 DIAGNOSIS — Z7984 Long term (current) use of oral hypoglycemic drugs: Secondary | ICD-10-CM | POA: Diagnosis not present

## 2021-03-01 DIAGNOSIS — G9341 Metabolic encephalopathy: Secondary | ICD-10-CM | POA: Diagnosis not present

## 2021-03-01 DIAGNOSIS — K219 Gastro-esophageal reflux disease without esophagitis: Secondary | ICD-10-CM | POA: Diagnosis not present

## 2021-03-01 DIAGNOSIS — K589 Irritable bowel syndrome without diarrhea: Secondary | ICD-10-CM | POA: Diagnosis not present

## 2021-03-01 DIAGNOSIS — Z9181 History of falling: Secondary | ICD-10-CM | POA: Diagnosis not present

## 2021-03-01 DIAGNOSIS — M75101 Unspecified rotator cuff tear or rupture of right shoulder, not specified as traumatic: Secondary | ICD-10-CM | POA: Diagnosis not present

## 2021-03-01 DIAGNOSIS — I10 Essential (primary) hypertension: Secondary | ICD-10-CM | POA: Diagnosis not present

## 2021-03-03 ENCOUNTER — Encounter: Payer: Medicare Other | Admitting: Orthopaedic Surgery

## 2021-03-04 ENCOUNTER — Telehealth: Payer: Self-pay | Admitting: *Deleted

## 2021-03-04 DIAGNOSIS — Z9181 History of falling: Secondary | ICD-10-CM | POA: Diagnosis not present

## 2021-03-04 DIAGNOSIS — I10 Essential (primary) hypertension: Secondary | ICD-10-CM | POA: Diagnosis not present

## 2021-03-04 DIAGNOSIS — Z7984 Long term (current) use of oral hypoglycemic drugs: Secondary | ICD-10-CM | POA: Diagnosis not present

## 2021-03-04 DIAGNOSIS — K219 Gastro-esophageal reflux disease without esophagitis: Secondary | ICD-10-CM | POA: Diagnosis not present

## 2021-03-04 DIAGNOSIS — Z7982 Long term (current) use of aspirin: Secondary | ICD-10-CM | POA: Diagnosis not present

## 2021-03-04 DIAGNOSIS — G9341 Metabolic encephalopathy: Secondary | ICD-10-CM | POA: Diagnosis not present

## 2021-03-04 DIAGNOSIS — Z96651 Presence of right artificial knee joint: Secondary | ICD-10-CM | POA: Diagnosis not present

## 2021-03-04 DIAGNOSIS — E119 Type 2 diabetes mellitus without complications: Secondary | ICD-10-CM | POA: Diagnosis not present

## 2021-03-04 DIAGNOSIS — K589 Irritable bowel syndrome without diarrhea: Secondary | ICD-10-CM | POA: Diagnosis not present

## 2021-03-04 DIAGNOSIS — M75101 Unspecified rotator cuff tear or rupture of right shoulder, not specified as traumatic: Secondary | ICD-10-CM | POA: Diagnosis not present

## 2021-03-04 DIAGNOSIS — Z471 Aftercare following joint replacement surgery: Secondary | ICD-10-CM | POA: Diagnosis not present

## 2021-03-04 NOTE — Telephone Encounter (Signed)
Ortho bundle 14 day call to patient. 

## 2021-03-09 DIAGNOSIS — M25561 Pain in right knee: Secondary | ICD-10-CM | POA: Diagnosis not present

## 2021-03-09 DIAGNOSIS — M6281 Muscle weakness (generalized): Secondary | ICD-10-CM | POA: Diagnosis not present

## 2021-03-09 DIAGNOSIS — M25661 Stiffness of right knee, not elsewhere classified: Secondary | ICD-10-CM | POA: Diagnosis not present

## 2021-03-10 ENCOUNTER — Encounter: Payer: Self-pay | Admitting: Orthopaedic Surgery

## 2021-03-10 ENCOUNTER — Ambulatory Visit (INDEPENDENT_AMBULATORY_CARE_PROVIDER_SITE_OTHER): Payer: Medicare Other | Admitting: Orthopaedic Surgery

## 2021-03-10 ENCOUNTER — Other Ambulatory Visit: Payer: Self-pay

## 2021-03-10 DIAGNOSIS — Z96651 Presence of right artificial knee joint: Secondary | ICD-10-CM

## 2021-03-10 MED ORDER — OXYCODONE HCL 5 MG PO TABS: 5.0000 mg | ORAL_TABLET | Freq: Four times a day (QID) | ORAL | 0 refills | Status: DC | PRN

## 2021-03-10 NOTE — Progress Notes (Signed)
The patient is 2 weeks status post a right total knee arthroplasty.  She says she is making good progress with range of motion and strength.  She had her first outpatient physical therapy visit yesterday.  The staples were removed from her incision and it looks good.  There is Steri-Strips in place.  Calf is soft and she can flex and extend her right foot.  She lacks full extension of her knee by about 3 degrees and I can only flex her to about 90 degrees.  She understands the importance of aggressive physical therapy to get the knee bending and moving.  She will continue to mobilize as much she can.  She is taking her regular daily aspirin.  I will see her back in 4 weeks to see how she is doing overall but no x-rays are needed.

## 2021-03-12 NOTE — Patient Instructions (Signed)
It was a pleasure to see you today.  Glad you are doing well status post knee arthroplasty.  Continue to work on diet exercise and weight loss.  Continue current medications and follow-up for health maintenance exam and Medicare wellness visit in 6 months.  Flu vaccine given today.

## 2021-03-15 DIAGNOSIS — M6281 Muscle weakness (generalized): Secondary | ICD-10-CM | POA: Diagnosis not present

## 2021-03-15 DIAGNOSIS — M25661 Stiffness of right knee, not elsewhere classified: Secondary | ICD-10-CM | POA: Diagnosis not present

## 2021-03-15 DIAGNOSIS — M25561 Pain in right knee: Secondary | ICD-10-CM | POA: Diagnosis not present

## 2021-03-16 ENCOUNTER — Ambulatory Visit
Admission: RE | Admit: 2021-03-16 | Discharge: 2021-03-16 | Disposition: A | Discharge status: Home / Self Care | Payer: Medicare Other | Source: Ambulatory Visit | Attending: Internal Medicine | Admitting: Internal Medicine

## 2021-03-16 DIAGNOSIS — Z1231 Encounter for screening mammogram for malignant neoplasm of breast: Secondary | ICD-10-CM

## 2021-03-17 DIAGNOSIS — M25661 Stiffness of right knee, not elsewhere classified: Secondary | ICD-10-CM | POA: Diagnosis not present

## 2021-03-17 DIAGNOSIS — M25561 Pain in right knee: Secondary | ICD-10-CM | POA: Diagnosis not present

## 2021-03-17 DIAGNOSIS — M6281 Muscle weakness (generalized): Secondary | ICD-10-CM | POA: Diagnosis not present

## 2021-03-22 DIAGNOSIS — M6281 Muscle weakness (generalized): Secondary | ICD-10-CM | POA: Diagnosis not present

## 2021-03-22 DIAGNOSIS — M25661 Stiffness of right knee, not elsewhere classified: Secondary | ICD-10-CM | POA: Diagnosis not present

## 2021-03-22 DIAGNOSIS — M25561 Pain in right knee: Secondary | ICD-10-CM | POA: Diagnosis not present

## 2021-03-24 ENCOUNTER — Other Ambulatory Visit: Payer: Self-pay | Admitting: Internal Medicine

## 2021-03-29 ENCOUNTER — Telehealth: Payer: Self-pay | Admitting: *Deleted

## 2021-03-29 DIAGNOSIS — M25561 Pain in right knee: Secondary | ICD-10-CM | POA: Diagnosis not present

## 2021-03-29 DIAGNOSIS — M25661 Stiffness of right knee, not elsewhere classified: Secondary | ICD-10-CM | POA: Diagnosis not present

## 2021-03-29 DIAGNOSIS — M6281 Muscle weakness (generalized): Secondary | ICD-10-CM | POA: Diagnosis not present

## 2021-03-29 NOTE — Telephone Encounter (Signed)
Ortho bundle 30 day call completed with survey. 

## 2021-04-05 DIAGNOSIS — M25661 Stiffness of right knee, not elsewhere classified: Secondary | ICD-10-CM | POA: Diagnosis not present

## 2021-04-05 DIAGNOSIS — M25561 Pain in right knee: Secondary | ICD-10-CM | POA: Diagnosis not present

## 2021-04-05 DIAGNOSIS — M6281 Muscle weakness (generalized): Secondary | ICD-10-CM | POA: Diagnosis not present

## 2021-04-13 DIAGNOSIS — M6281 Muscle weakness (generalized): Secondary | ICD-10-CM | POA: Diagnosis not present

## 2021-04-13 DIAGNOSIS — M25661 Stiffness of right knee, not elsewhere classified: Secondary | ICD-10-CM | POA: Diagnosis not present

## 2021-04-13 DIAGNOSIS — M25561 Pain in right knee: Secondary | ICD-10-CM | POA: Diagnosis not present

## 2021-04-14 ENCOUNTER — Encounter: Payer: Self-pay | Admitting: Orthopaedic Surgery

## 2021-04-14 ENCOUNTER — Ambulatory Visit (INDEPENDENT_AMBULATORY_CARE_PROVIDER_SITE_OTHER): Payer: Medicare Other | Admitting: Orthopaedic Surgery

## 2021-04-14 DIAGNOSIS — Z96651 Presence of right artificial knee joint: Secondary | ICD-10-CM

## 2021-04-14 NOTE — Progress Notes (Signed)
The patient is now over 6 weeks status post a right total knee arthroplasty.  Notes from physical therapy stated they have been able to flex her to 95 to 100 degrees.  In the office today I can only flex her to about 90 degrees.  She feels like she can still push herself through therapy and get it bending more.  She does have some swelling of the knee to be expected but it feels ligamentously stable.  Her calf is soft.  We will see her back in 4 weeks to see how she is doing overall but no x-rays are needed.  She will continue to push herself hard to physical therapy.

## 2021-04-20 DIAGNOSIS — M6281 Muscle weakness (generalized): Secondary | ICD-10-CM | POA: Diagnosis not present

## 2021-04-20 DIAGNOSIS — M25561 Pain in right knee: Secondary | ICD-10-CM | POA: Diagnosis not present

## 2021-04-20 DIAGNOSIS — M25661 Stiffness of right knee, not elsewhere classified: Secondary | ICD-10-CM | POA: Diagnosis not present

## 2021-04-26 DIAGNOSIS — M6281 Muscle weakness (generalized): Secondary | ICD-10-CM | POA: Diagnosis not present

## 2021-04-26 DIAGNOSIS — M25661 Stiffness of right knee, not elsewhere classified: Secondary | ICD-10-CM | POA: Diagnosis not present

## 2021-04-26 DIAGNOSIS — M25561 Pain in right knee: Secondary | ICD-10-CM | POA: Diagnosis not present

## 2021-05-03 DIAGNOSIS — M25661 Stiffness of right knee, not elsewhere classified: Secondary | ICD-10-CM | POA: Diagnosis not present

## 2021-05-03 DIAGNOSIS — M6281 Muscle weakness (generalized): Secondary | ICD-10-CM | POA: Diagnosis not present

## 2021-05-03 DIAGNOSIS — M25561 Pain in right knee: Secondary | ICD-10-CM | POA: Diagnosis not present

## 2021-05-10 DIAGNOSIS — M25561 Pain in right knee: Secondary | ICD-10-CM | POA: Diagnosis not present

## 2021-05-10 DIAGNOSIS — M25661 Stiffness of right knee, not elsewhere classified: Secondary | ICD-10-CM | POA: Diagnosis not present

## 2021-05-10 DIAGNOSIS — M6281 Muscle weakness (generalized): Secondary | ICD-10-CM | POA: Diagnosis not present

## 2021-05-12 ENCOUNTER — Other Ambulatory Visit: Payer: Self-pay

## 2021-05-12 ENCOUNTER — Ambulatory Visit (INDEPENDENT_AMBULATORY_CARE_PROVIDER_SITE_OTHER): Payer: Medicare Other | Admitting: Orthopaedic Surgery

## 2021-05-12 ENCOUNTER — Encounter: Payer: Self-pay | Admitting: Orthopaedic Surgery

## 2021-05-12 DIAGNOSIS — Z96651 Presence of right artificial knee joint: Secondary | ICD-10-CM

## 2021-05-12 NOTE — Progress Notes (Signed)
HPI: [name] returns today for follow-up of her right total knee arthroplasty which was performed on 02/18/2021.  She continues to go to therapy once a week.  She is taking no pain medication.  She states she got the best sleep she has had since surgery last night.  She is struggling with her left knee which she has known moderate arthritis.  Physical exam: Right knee full extension flexion actively to 100 degrees passively and bring into 105 degrees.  Surgical incisions healing well slight keloid.  Calf supple nontender.  Dorsiflexion plantarflexion right ankle intact.  Impression: Status post right total knee arthroplasty  Plan: Recommend she continue working with therapy on range of motion strengthening.  Scar tissue mobilization was encouraged.  We will see her back in 3 months to see how she is doing overall.  Questions were encouraged and answered by Dr. Magnus Ivan myself.

## 2021-05-17 DIAGNOSIS — M6281 Muscle weakness (generalized): Secondary | ICD-10-CM | POA: Diagnosis not present

## 2021-05-17 DIAGNOSIS — M25561 Pain in right knee: Secondary | ICD-10-CM | POA: Diagnosis not present

## 2021-05-17 DIAGNOSIS — M25661 Stiffness of right knee, not elsewhere classified: Secondary | ICD-10-CM | POA: Diagnosis not present

## 2021-05-24 DIAGNOSIS — M25661 Stiffness of right knee, not elsewhere classified: Secondary | ICD-10-CM | POA: Diagnosis not present

## 2021-05-24 DIAGNOSIS — M25561 Pain in right knee: Secondary | ICD-10-CM | POA: Diagnosis not present

## 2021-05-24 DIAGNOSIS — M6281 Muscle weakness (generalized): Secondary | ICD-10-CM | POA: Diagnosis not present

## 2021-06-04 ENCOUNTER — Telehealth: Payer: Self-pay | Admitting: *Deleted

## 2021-06-04 NOTE — Telephone Encounter (Signed)
90 day Ortho bundle call completed. 

## 2021-06-10 ENCOUNTER — Other Ambulatory Visit: Payer: Self-pay

## 2021-06-10 ENCOUNTER — Other Ambulatory Visit: Payer: Medicare Other | Admitting: Internal Medicine

## 2021-06-10 DIAGNOSIS — R5383 Other fatigue: Secondary | ICD-10-CM | POA: Diagnosis not present

## 2021-06-10 DIAGNOSIS — E119 Type 2 diabetes mellitus without complications: Secondary | ICD-10-CM | POA: Diagnosis not present

## 2021-06-10 DIAGNOSIS — I10 Essential (primary) hypertension: Secondary | ICD-10-CM | POA: Diagnosis not present

## 2021-06-10 DIAGNOSIS — E1169 Type 2 diabetes mellitus with other specified complication: Secondary | ICD-10-CM

## 2021-06-10 DIAGNOSIS — E785 Hyperlipidemia, unspecified: Secondary | ICD-10-CM

## 2021-06-10 LAB — CBC WITH DIFFERENTIAL/PLATELET
Absolute Monocytes: 360 cells/uL (ref 200–950)
Basophils Absolute: 18 cells/uL (ref 0–200)
Basophils Relative: 0.3 %
Eosinophils Absolute: 240 cells/uL (ref 15–500)
Eosinophils Relative: 4 %
HCT: 41.8 % (ref 35.0–45.0)
Hemoglobin: 13.8 g/dL (ref 11.7–15.5)
Lymphs Abs: 2724 cells/uL (ref 850–3900)
MCH: 26.6 pg — ABNORMAL LOW (ref 27.0–33.0)
MCHC: 33 g/dL (ref 32.0–36.0)
MCV: 80.5 fL (ref 80.0–100.0)
MPV: 10.3 fL (ref 7.5–12.5)
Monocytes Relative: 6 %
Neutro Abs: 2658 cells/uL (ref 1500–7800)
Neutrophils Relative %: 44.3 %
Platelets: 378 10*3/uL (ref 140–400)
RBC: 5.19 10*6/uL — ABNORMAL HIGH (ref 3.80–5.10)
RDW: 14.5 % (ref 11.0–15.0)
Total Lymphocyte: 45.4 %
WBC: 6 10*3/uL (ref 3.8–10.8)

## 2021-06-10 LAB — COMPLETE METABOLIC PANEL WITH GFR
AG Ratio: 1.3 (calc) (ref 1.0–2.5)
ALT: 13 U/L (ref 6–29)
AST: 14 U/L (ref 10–35)
Albumin: 4.7 g/dL (ref 3.6–5.1)
Alkaline phosphatase (APISO): 99 U/L (ref 37–153)
BUN/Creatinine Ratio: 23 (calc) — ABNORMAL HIGH (ref 6–22)
BUN: 33 mg/dL — ABNORMAL HIGH (ref 7–25)
CO2: 32 mmol/L (ref 20–32)
Calcium: 10.5 mg/dL — ABNORMAL HIGH (ref 8.6–10.4)
Chloride: 102 mmol/L (ref 98–110)
Creat: 1.42 mg/dL — ABNORMAL HIGH (ref 0.60–1.00)
Globulin: 3.5 g/dL (calc) (ref 1.9–3.7)
Glucose, Bld: 126 mg/dL — ABNORMAL HIGH (ref 65–99)
Potassium: 5 mmol/L (ref 3.5–5.3)
Sodium: 145 mmol/L (ref 135–146)
Total Bilirubin: 0.6 mg/dL (ref 0.2–1.2)
Total Protein: 8.2 g/dL — ABNORMAL HIGH (ref 6.1–8.1)
eGFR: 39 mL/min/{1.73_m2} — ABNORMAL LOW (ref >=60)

## 2021-06-10 LAB — HEMOGLOBIN A1C
Hgb A1c MFr Bld: 6.7 % of total Hgb — ABNORMAL HIGH (ref <5.7)
Mean Plasma Glucose: 146 mg/dL
eAG (mmol/L): 8.1 mmol/L

## 2021-06-10 LAB — LIPID PANEL
Cholesterol: 151 mg/dL (ref <200)
HDL: 58 mg/dL (ref >=50)
LDL Cholesterol (Calc): 78 mg/dL (calc)
Non-HDL Cholesterol (Calc): 93 mg/dL (calc) (ref <130)
Total CHOL/HDL Ratio: 2.6 (calc) (ref <5.0)
Triglycerides: 73 mg/dL (ref <150)

## 2021-06-10 LAB — TSH: TSH: 1.8 mIU/L (ref 0.40–4.50)

## 2021-06-11 ENCOUNTER — Ambulatory Visit (INDEPENDENT_AMBULATORY_CARE_PROVIDER_SITE_OTHER): Payer: Medicare Other | Admitting: Internal Medicine

## 2021-06-11 ENCOUNTER — Encounter: Payer: Self-pay | Admitting: Internal Medicine

## 2021-06-11 VITALS — BP 122/82 | HR 84 | Temp 97.7°F | Ht 63.0 in | Wt 163.5 lb

## 2021-06-11 DIAGNOSIS — E1169 Type 2 diabetes mellitus with other specified complication: Secondary | ICD-10-CM | POA: Diagnosis not present

## 2021-06-11 DIAGNOSIS — I10 Essential (primary) hypertension: Secondary | ICD-10-CM

## 2021-06-11 DIAGNOSIS — Z6828 Body mass index (BMI) 28.0-28.9, adult: Secondary | ICD-10-CM

## 2021-06-11 DIAGNOSIS — E785 Hyperlipidemia, unspecified: Secondary | ICD-10-CM | POA: Diagnosis not present

## 2021-06-11 DIAGNOSIS — Z86010 Personal history of colonic polyps: Secondary | ICD-10-CM

## 2021-06-11 DIAGNOSIS — Z0000 Encounter for general adult medical examination without abnormal findings: Secondary | ICD-10-CM

## 2021-06-11 DIAGNOSIS — Z96651 Presence of right artificial knee joint: Secondary | ICD-10-CM

## 2021-06-11 DIAGNOSIS — M170 Bilateral primary osteoarthritis of knee: Secondary | ICD-10-CM

## 2021-06-11 DIAGNOSIS — H9193 Unspecified hearing loss, bilateral: Secondary | ICD-10-CM

## 2021-06-11 DIAGNOSIS — Z780 Asymptomatic menopausal state: Secondary | ICD-10-CM | POA: Diagnosis not present

## 2021-06-11 DIAGNOSIS — K219 Gastro-esophageal reflux disease without esophagitis: Secondary | ICD-10-CM

## 2021-06-11 LAB — POCT URINALYSIS DIPSTICK
Bilirubin, UA: NEGATIVE
Blood, UA: NEGATIVE
Glucose, UA: NEGATIVE
Leukocytes, UA: NEGATIVE
Nitrite, UA: NEGATIVE
Protein, UA: NEGATIVE
Spec Grav, UA: 1.015 (ref 1.010–1.025)
Urobilinogen, UA: 0.2 E.U./dL
pH, UA: 5 (ref 5.0–8.0)

## 2021-06-11 NOTE — Patient Instructions (Addendum)
Please return in 6 months for Hgb AIC and B-met. All other labs are normal. Drink 3 glasses of water before coming for lab draw so youbare well hydrated. Very mild hearing loss. Continue current meds. Continue rehab with your knee.

## 2021-06-11 NOTE — Progress Notes (Addendum)
Annual Wellness Visit     Patient: Kathleen Greer, Female    DOB: 18-Aug-1945, 76 y.o.   MRN: [id number] Visit Date: 06/11/2021  Chief Complaint  Patient presents with   Medicare Wellness   Subjective    Kathleen Greer is a 76 y.o. female who presents today for her Annual Wellness Visit.  HPI 76 year old Female seen for health maintenance exam and evaluation of medical issues.Colonoscopy due August 11, 2023. Needs Shingrix vaccine. Patient has history of GE reflux, type 2 diabetes mellitus, dependent edema, hypokalemia secondary to diuretic therapy and history of scoliosis of lumbar spine.  History of hypertension, status post right TKA.  She is able to drive.  Getting physical therapy and doing pretty well but right knee still a bit stiff.  She had colonoscopy Aug 10, 2013 which was normal with 10-year follow-up recommended.  There is remote history of adenomatous polyps.  Hysterectomy without oophorectomy 1971.  Right rotator cuff surgery June 2018.  Right carpal tunnel release 08/10/01.  Penicillin causes a rash.  GE reflux treated with omeprazole.  History of hypokalemia due to diuretic therapy.  Potassium normal on potassium supplementation.  History of anxiety which has improved over the years.  Social history: She is retired and formerly worked at [employer] as a Location manager.  Does not smoke or consume alcohol.  Has been raising elementary school aged grandson as his father is in prison.  Father is patient's grandson.  Family history: Adult son with hypertension.  Patient's father died with an MI.  Mother died of an MI.  1 brother died with heart failure.  Another brother died in Aug 10, 2004 with cirrhosis of the liver.  Sister died at age 42.  Another sister who is health status is apparently stable   Social History   Social History Narrative   Social history: Patient is retired and formerly worked at [employer] as a Location manager. She does not smoke or consume alcohol.      Family  history: Adult son with hypertension. Patient's father died with an MI. Mother died of an MI. 1 brother died with heart failure. Another brother died in 08/10/2004 with cirrhosis of the liver. Sister died at age 55. Another sister whose health is apparently unchanged    Patient Care Team: Margaree Mackintosh, MD as PCP - General (Internal Medicine)  Review of Systems some complaint in hearing.  Hearing tested today without he is scheduled to device is essentially normal.  Some decreased hearing at lower decibels.   Objective    Vitals:  Blood pressure 122/82 pulse 84 regular pulse oximetry 98%  Physical Exam Skin: Warm and dry.  No cervical adenopathy.  No carotid bruits.  No thyromegaly.  No JVD.  Chest is clear to auscultation.  Cardiac exam: Regular rate and rhythm without ectopy.  Breasts are without masses.  Abdomen is soft nondistended without hepatosplenomegaly masses or tenderness.  No pitting edema of the lower extremities.  Neuro is intact without gross focal deficits.  Impression:  Very mild hearing loss.  She is considering going to a hearing aid center for further evaluation.  Essential hypertension stable on current regimen  Status post right TKA and doing well  History of anxiety which has improved over the years  History of GE reflux treated with PPI  History of hypokalemia due to diuretic therapy.  Potassium is normal on potassium supplement.  Hemoglobin A1c 6.7% and has been stable over the past year.  Would likely be better if she  was more active.  Prior to knee replacement it was as low as 6.2%.  Follow-up in 6 months.  Currently treated with metformin  Hyperlipidemia treated with Crestor and lipid panel is within normal limits  Dependent edema treated with Demadex.  No significant lower extremity edema at the present time  History of adenomatous colon polyp due for repeat colonoscopy 2025   Most recent functional status assessment: In your present state of health, do  you have any difficulty performing the following activities: 06/11/2021  Hearing? N  Vision? N  Comment -  Difficulty concentrating or making decisions? N  Walking or climbing stairs? N  Comment -  Dressing or bathing? N  Doing errands, shopping? N  Preparing Food and eating ? N  Using the Toilet? N  In the past six months, have you accidently leaked urine? N  Do you have problems with loss of bowel control? N  Managing your Medications? N  Managing your Finances? N  Housekeeping or managing your Housekeeping? N  Some recent data might be hidden   Most recent fall risk assessment: Fall Risk  06/11/2021  Falls in the past year? 0  Number falls in past yr: 0  Injury with Fall? 0  Risk for fall due to : No Fall Risks  Follow up Falls evaluation completed    Most recent depression screenings: PHQ 2/9 Scores 06/11/2021 06/08/2020  PHQ - 2 Score 0 0   Most recent cognitive screening: 6CIT Screen 06/11/2021  What Year? 0 points  What month? 0 points  What time? 0 points  Count back from 20 0 points  Months in reverse 0 points  Repeat phrase 0 points  Total Score 0       Assessment & Plan     Annual wellness visit done today including the all of the following: Reviewed patient's Family Medical History Reviewed and updated list of patient's medical providers Assessment of cognitive impairment was done Assessed patient's functional ability Established a written schedule for health screening services Health Risk Assessent Completed and Reviewed  Discussed health benefits of physical activity, and encouraged her to engage in regular exercise appropriate for her age and condition.         {I, Margaree Mackintosh, MD, have reviewed all documentation for this visit. The documentation on 06/11/21 for the exam, diagnosis, procedures, and orders are all accurate and complete.   Jama Flavors, CMA

## 2021-06-18 ENCOUNTER — Other Ambulatory Visit: Payer: Self-pay | Admitting: Internal Medicine

## 2021-07-01 ENCOUNTER — Other Ambulatory Visit: Payer: Self-pay | Admitting: Internal Medicine

## 2021-07-13 DIAGNOSIS — M6281 Muscle weakness (generalized): Secondary | ICD-10-CM | POA: Diagnosis not present

## 2021-07-13 DIAGNOSIS — M25561 Pain in right knee: Secondary | ICD-10-CM | POA: Diagnosis not present

## 2021-07-13 DIAGNOSIS — M25661 Stiffness of right knee, not elsewhere classified: Secondary | ICD-10-CM | POA: Diagnosis not present

## 2021-07-19 DIAGNOSIS — M25561 Pain in right knee: Secondary | ICD-10-CM | POA: Diagnosis not present

## 2021-07-19 DIAGNOSIS — M25661 Stiffness of right knee, not elsewhere classified: Secondary | ICD-10-CM | POA: Diagnosis not present

## 2021-07-19 DIAGNOSIS — M6281 Muscle weakness (generalized): Secondary | ICD-10-CM | POA: Diagnosis not present

## 2021-07-26 DIAGNOSIS — M6281 Muscle weakness (generalized): Secondary | ICD-10-CM | POA: Diagnosis not present

## 2021-07-26 DIAGNOSIS — M25661 Stiffness of right knee, not elsewhere classified: Secondary | ICD-10-CM | POA: Diagnosis not present

## 2021-07-26 DIAGNOSIS — M25561 Pain in right knee: Secondary | ICD-10-CM | POA: Diagnosis not present

## 2021-08-02 DIAGNOSIS — M25561 Pain in right knee: Secondary | ICD-10-CM | POA: Diagnosis not present

## 2021-08-02 DIAGNOSIS — M25661 Stiffness of right knee, not elsewhere classified: Secondary | ICD-10-CM | POA: Diagnosis not present

## 2021-08-02 DIAGNOSIS — M6281 Muscle weakness (generalized): Secondary | ICD-10-CM | POA: Diagnosis not present

## 2021-08-09 DIAGNOSIS — M25561 Pain in right knee: Secondary | ICD-10-CM | POA: Diagnosis not present

## 2021-08-09 DIAGNOSIS — M6281 Muscle weakness (generalized): Secondary | ICD-10-CM | POA: Diagnosis not present

## 2021-08-09 DIAGNOSIS — M25661 Stiffness of right knee, not elsewhere classified: Secondary | ICD-10-CM | POA: Diagnosis not present

## 2021-08-10 ENCOUNTER — Ambulatory Visit (INDEPENDENT_AMBULATORY_CARE_PROVIDER_SITE_OTHER): Payer: Medicare Other

## 2021-08-10 ENCOUNTER — Encounter: Payer: Self-pay | Admitting: Orthopaedic Surgery

## 2021-08-10 ENCOUNTER — Ambulatory Visit (INDEPENDENT_AMBULATORY_CARE_PROVIDER_SITE_OTHER): Payer: Medicare Other | Admitting: Orthopaedic Surgery

## 2021-08-10 DIAGNOSIS — M25562 Pain in left knee: Secondary | ICD-10-CM | POA: Diagnosis not present

## 2021-08-10 DIAGNOSIS — M1712 Unilateral primary osteoarthritis, left knee: Secondary | ICD-10-CM

## 2021-08-10 DIAGNOSIS — G8929 Other chronic pain: Secondary | ICD-10-CM | POA: Diagnosis not present

## 2021-08-10 DIAGNOSIS — Z96651 Presence of right artificial knee joint: Secondary | ICD-10-CM

## 2021-08-10 NOTE — Progress Notes (Signed)
The patient is now almost 6 months status post a right total knee arthroplasty.  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  At this point her right knee is doing well but she would like to go ahead and proceed with a left knee replacement this summer.  She reports good mobility but still some swelling in the knee but she is sleeping better. ? ?Examination of her right operative knee shows she has full extension.  Her flexion is just barely past 90 degrees.  She hopes to have some better flexion.  Examination of her left knee shows varus malalignment with bone-on-bone wear the medial side with severe medial joint line tenderness and patellofemoral tenderness as well. ? ?Standing x-rays of the knees show a well-seated total knee arthroplasty on the right knee.  The left knee has severe end-stage tricompartment arthritis with varus malalignment and bone-on-bone wear the medial compartment and the patellofemoral joint. ? ?At this point we will work on scheduling her for a left total knee arthroplasty to treat her severe osteoarthritis of the left knee.  This will be done sometime later in June after family is out of school.  At that visit we can certainly potentially manipulate her right knee while she is under anesthesia.  All questions and concerns were answered addressed.  We will be in touch with scheduling her for a left total knee arthroplasty. ?

## 2021-08-23 DIAGNOSIS — E119 Type 2 diabetes mellitus without complications: Secondary | ICD-10-CM | POA: Diagnosis not present

## 2021-08-23 LAB — HM DIABETES EYE EXAM

## 2021-08-24 ENCOUNTER — Encounter: Payer: Self-pay | Admitting: Internal Medicine

## 2021-08-30 ENCOUNTER — Telehealth: Payer: Self-pay | Admitting: Internal Medicine

## 2021-08-30 ENCOUNTER — Other Ambulatory Visit: Payer: Self-pay

## 2021-08-30 NOTE — Telephone Encounter (Signed)
Left message for patient to return call to office at [phone].  

## 2021-08-30 NOTE — Telephone Encounter (Signed)
Kathleen Greer ?[phone] ? ?Kathleen Greer called to say she was completely out of below medication. She stated her BP was more controled with this medication. She is completely out. ? ?glipiZIDE (GLUCOTROL) 5 MG tablet ? ?If she needs to be on immediately send in to [facility] if not send to [facility]. It looks like to me she should have refills at [facility], but she stated bottle had No refills on it. ?

## 2021-08-30 NOTE — Telephone Encounter (Signed)
Results have been relayed to the patient. The patient verbalized understanding. No questions at this time.   

## 2021-09-14 ENCOUNTER — Ambulatory Visit
Admission: RE | Admit: 2021-09-14 | Discharge: 2021-09-14 | Disposition: A | Discharge status: Home / Self Care | Payer: Medicare Other | Source: Ambulatory Visit | Attending: Internal Medicine | Admitting: Internal Medicine

## 2021-09-14 DIAGNOSIS — Z780 Asymptomatic menopausal state: Secondary | ICD-10-CM | POA: Diagnosis not present

## 2021-09-22 ENCOUNTER — Other Ambulatory Visit: Payer: Self-pay | Admitting: Physician Assistant

## 2021-09-22 DIAGNOSIS — M1712 Unilateral primary osteoarthritis, left knee: Secondary | ICD-10-CM

## 2021-10-04 NOTE — Pre-Procedure Instructions (Signed)
Surgical Instructions    Your procedure is scheduled on October 12, 2021.  Report to [facility] Main Entrance "A" at 10:00 A.M., then check in with the Admitting office.  Call this number if you have problems the morning of surgery:  [phone]   If you have any questions prior to your surgery date call [phone]: Open Monday-Friday 8am-4pm    Remember:  Do not eat after midnight the night before your surgery  You may drink clear liquids until 9:00 AM the morning of your surgery.   Clear liquids allowed are: Water, Non-Citrus Juices (without pulp), Carbonated Beverages, Clear Tea, Black Coffee Only (NO MILK, CREAM OR POWDERED CREAMER of any kind), and Gatorade.  Patient Instructions  The night before surgery:  No food after midnight. ONLY clear liquids after midnight  The day of surgery (if you have diabetes): Drink ONE (1) 12 oz G2 given to you in your pre admission testing appointment by 9:00 AM the morning of surgery. Drink in one sitting. Do not sip.  This drink was given to you during your hospital  pre-op appointment visit.  Nothing else to drink after completing the  12 oz bottle of G2.         If you have questions, please contact your surgeon's office.     Take these medicines the morning of surgery with A SIP OF WATER:  amLODipine (NORVASC)  omeprazole (PRILOSEC)  rosuvastatin (CRESTOR)   Take these medicines the morning of surgery AS NEEDED:  fluticasone (FLONASE)  sodium chloride (OCEAN)  Tetrahydrozoline HCl (VISINE OP)  WHAT DO I DO ABOUT MY DIABETES MEDICATION?   Do not take oral diabetes medicines (pills) the morning of surgery, this includes your metFORMIN (GLUCOPHAGE). Do not take glipiZIDE (GLUCOTROL) the evening before surgery or the morning of surgery.   HOW TO MANAGE YOUR DIABETES BEFORE AND AFTER SURGERY  Why is it important to control my blood sugar before and after surgery? Improving blood sugar levels before and after surgery helps  healing and can limit problems. A way of improving blood sugar control is eating a healthy diet by:  Eating less sugar and carbohydrates  Increasing activity/exercise  Talking with your doctor about reaching your blood sugar goals High blood sugars (greater than 180 mg/dL) can raise your risk of infections and slow your recovery, so you will need to focus on controlling your diabetes during the weeks before surgery. Make sure that the doctor who takes care of your diabetes knows about your planned surgery including the date and location.  How do I manage my blood sugar before surgery? Check your blood sugar at least 4 times a day, starting 2 days before surgery, to make sure that the level is not too high or low.  Check your blood sugar the morning of your surgery when you wake up and every 2 hours until you get to the Short Stay unit.  If your blood sugar is less than 70 mg/dL, you will need to treat for low blood sugar: Do not take insulin. Treat a low blood sugar (less than 70 mg/dL) with  cup of clear juice (cranberry or apple), 4 glucose tablets, OR glucose gel. Recheck blood sugar in 15 minutes after treatment (to make sure it is greater than 70 mg/dL). If your blood sugar is not greater than 70 mg/dL on recheck, call [phone] for further instructions. Report your blood sugar to the short stay nurse when you get to Short Stay.  If you are admitted  to the hospital after surgery: Your blood sugar will be checked by the staff and you will probably be given insulin after surgery (instead of oral diabetes medicines) to make sure you have good blood sugar levels. The goal for blood sugar control after surgery is 80-180 mg/dL.    Follow your surgeon's instructions on when to stop Aspirin.  If no instructions were given by your surgeon then you will need to call the office to get those instructions.     As of today, STOP taking any Aspirin (unless otherwise instructed by your surgeon)  Aleve, Naproxen, Ibuprofen, Motrin, Advil, Goody's, BC's, all herbal medications, fish oil, and all vitamins.                     Do NOT Smoke (Tobacco/Vaping) for 24 hours prior to your procedure.  If you use a CPAP at night, you may bring your mask/headgear for your overnight stay.   Contacts, glasses, piercing's, hearing aid's, dentures or partials may not be worn into surgery, please bring cases for these belongings.    For patients admitted to the hospital, discharge time will be determined by your treatment team.   Patients discharged the day of surgery will not be allowed to drive home, and someone needs to stay with them for 24 hours.  SURGICAL WAITING ROOM VISITATION Patients having surgery or a procedure may have two support people in the waiting room. These visitors may be switched out with other visitors if needed. Children under the age of 87 must have an adult accompany them who is not the patient. If the patient needs to stay at the hospital during part of their recovery, the visitor guidelines for inpatient rooms apply.  Please refer to the [facility] website for the visitor guidelines for Inpatients (after your surgery is over and you are in a regular room).    Special instructions:   [place]- Preparing For Surgery  Before surgery, you can play an important role. Because skin is not sterile, your skin needs to be as free of germs as possible. You can reduce the number of germs on your skin by washing with CHG (chlorahexidine gluconate) Soap before surgery.  CHG is an antiseptic cleaner which kills germs and bonds with the skin to continue killing germs even after washing.    Oral Hygiene is also important to reduce your risk of infection.  Remember - BRUSH YOUR TEETH THE MORNING OF SURGERY WITH YOUR REGULAR TOOTHPASTE  Please do not use if you have an allergy to CHG or antibacterial soaps. If your skin becomes reddened/irritated stop using the CHG.  Do not shave  (including legs and underarms) for at least 48 hours prior to first CHG shower. It is OK to shave your face.  Please follow these instructions carefully.   Shower the NIGHT BEFORE SURGERY and the MORNING OF SURGERY  If you chose to wash your hair, wash your hair first as usual with your normal shampoo.  After you shampoo, rinse your hair and body thoroughly to remove the shampoo.  Use CHG Soap as you would any other liquid soap. You can apply CHG directly to the skin and wash gently with a scrungie or a clean washcloth.   Apply the CHG Soap to your body ONLY FROM THE NECK DOWN.  Do not use on open wounds or open sores. Avoid contact with your eyes, ears, mouth and genitals (private parts). Wash Face and genitals (private parts)  with your normal  soap.   Wash thoroughly, paying special attention to the area where your surgery will be performed.  Thoroughly rinse your body with warm water from the neck down.  DO NOT shower/wash with your normal soap after using and rinsing off the CHG Soap.  Pat yourself dry with a CLEAN TOWEL.  Wear CLEAN PAJAMAS to bed the night before surgery  Place CLEAN SHEETS on your bed the night before your surgery  DO NOT SLEEP WITH PETS.   Day of Surgery: Take a shower with CHG soap.  Do not wear jewelry or makeup Do not wear lotions, powders, perfumes/colognes, or deodorant. Do not shave 48 hours prior to surgery.  Do not bring valuables to the hospital.  [facility] is not responsible for any belongings or valuables. Do not wear nail polish, gel polish, artificial nails, or any other type of covering on natural nails (fingers and toes) If you have artificial nails or gel coating that need to be removed by a nail salon, please have this removed prior to surgery. Artificial nails or gel coating may interfere with anesthesia's ability to adequately monitor your vital signs.  Wear Clean/Comfortable clothing the morning of surgery  Remember to brush  your teeth WITH YOUR REGULAR TOOTHPASTE.   Please read over the following fact sheets that you were given.    If you received a COVID test during your pre-op visit  it is requested that you wear a mask when out in public, stay away from anyone that may not be feeling well and notify your surgeon if you develop symptoms. If you have been in contact with anyone that has tested positive in the last 10 days please notify you surgeon.

## 2021-10-05 ENCOUNTER — Encounter (HOSPITAL_COMMUNITY): Payer: Self-pay

## 2021-10-05 ENCOUNTER — Encounter (HOSPITAL_COMMUNITY)
Admission: RE | Admit: 2021-10-05 | Discharge: 2021-10-05 | Disposition: A | Discharge status: Home / Self Care | Payer: Medicare Other | Source: Ambulatory Visit | Attending: Orthopaedic Surgery | Admitting: Orthopaedic Surgery

## 2021-10-05 ENCOUNTER — Other Ambulatory Visit: Payer: Self-pay

## 2021-10-05 VITALS — BP 173/85 | HR 79 | Temp 98.4°F | Resp 17 | Ht 63.0 in | Wt 165.3 lb

## 2021-10-05 DIAGNOSIS — M1712 Unilateral primary osteoarthritis, left knee: Secondary | ICD-10-CM | POA: Insufficient documentation

## 2021-10-05 DIAGNOSIS — Z01818 Encounter for other preprocedural examination: Secondary | ICD-10-CM | POA: Diagnosis not present

## 2021-10-05 LAB — BASIC METABOLIC PANEL
Anion gap: 8 (ref 5–15)
BUN: 22 mg/dL (ref 8–23)
CO2: 29 mmol/L (ref 22–32)
Calcium: 9.1 mg/dL (ref 8.9–10.3)
Chloride: 106 mmol/L (ref 98–111)
Creatinine, Ser: 1.12 mg/dL — ABNORMAL HIGH (ref 0.44–1.00)
GFR, Estimated: 51 mL/min — ABNORMAL LOW (ref >60)
Glucose, Bld: 166 mg/dL — ABNORMAL HIGH (ref 70–99)
Potassium: 3.4 mmol/L — ABNORMAL LOW (ref 3.5–5.1)
Sodium: 143 mmol/L (ref 135–145)

## 2021-10-05 LAB — GLUCOSE, CAPILLARY: Glucose-Capillary: 224 mg/dL — ABNORMAL HIGH (ref 70–99)

## 2021-10-05 LAB — CBC
HCT: 38.7 % (ref 36.0–46.0)
Hemoglobin: 13.7 g/dL (ref 12.0–15.0)
MCH: 27.9 pg (ref 26.0–34.0)
MCHC: 35.4 g/dL (ref 30.0–36.0)
MCV: 78.8 fL — ABNORMAL LOW (ref 80.0–100.0)
Platelets: 315 10*3/uL (ref 150–400)
RBC: 4.91 MIL/uL (ref 3.87–5.11)
RDW: 13.3 % (ref 11.5–15.5)
WBC: 6.3 10*3/uL (ref 4.0–10.5)
nRBC: 0 % (ref 0.0–0.2)

## 2021-10-05 LAB — TYPE AND SCREEN
ABO/RH(D): A POS
Antibody Screen: NEGATIVE

## 2021-10-05 LAB — SURGICAL PCR SCREEN
MRSA, PCR: NEGATIVE
Staphylococcus aureus: NEGATIVE

## 2021-10-05 NOTE — Progress Notes (Signed)
Per [facility] lab, pt's A1C is still in process because it had to be sent out to [facility] due to "inconclusive results".

## 2021-10-05 NOTE — Progress Notes (Signed)
PCP - Luanna Cole. Lenord Fellers, MD Cardiologist - denies  PPM/ICD - denies Device Orders -  Rep Notified -   Chest x-ray - none EKG - 10/05/21 Stress Test - 09/28/09 ECHO - 09/27/09 Cardiac Cath - none  Sleep Study - none CPAP -   Fasting Blood Sugar - pt states she doesn't check her blood sugar. She says her meter does not work. Encouraged her to call Dr. Beryle Quant office to see if she could get another meter. Also told her the pharmacy where she got her meter might be able to help her.  Checks Blood Sugar _____ times a day  Blood Thinner Instructions:na Aspirin Instructions:pt states she will call Dr. Eliberto Ivory office for instructions regarding aspirin.   ERAS Protcol - clear liquids until 0900 PRE-SURGERY Ensure or G2- G2  COVID TEST- na   Anesthesia review: no  Patient denies shortness of breath, fever, cough and chest pain at PAT appointment   All instructions explained to the patient, with a verbal understanding of the material. Patient agrees to go over the instructions while at home for a better understanding. Patient also instructed to self quarantine after being tested for COVID-19. The opportunity to ask questions was provided.

## 2021-10-06 LAB — HEMOGLOBIN A1C
Hgb A1c MFr Bld: 6.7 % — ABNORMAL HIGH (ref 4.8–5.6)
Mean Plasma Glucose: 146 mg/dL

## 2021-10-08 ENCOUNTER — Telehealth: Payer: Self-pay

## 2021-10-08 ENCOUNTER — Other Ambulatory Visit: Payer: Self-pay | Admitting: Internal Medicine

## 2021-10-08 NOTE — Telephone Encounter (Signed)
Patient called and rescheduled TKA originally scheduled for 10/12/21 to 12/07/21.  She is having difficulty with transportation and son's cancer is back.

## 2021-10-12 ENCOUNTER — Encounter (HOSPITAL_COMMUNITY): Admission: RE | Payer: Self-pay | Source: Home / Self Care

## 2021-10-12 ENCOUNTER — Ambulatory Visit (HOSPITAL_COMMUNITY): Admission: RE | Admit: 2021-10-12 | Payer: Medicare Other | Source: Home / Self Care | Admitting: Orthopaedic Surgery

## 2021-10-12 SURGERY — ARTHROPLASTY, KNEE, TOTAL
Anesthesia: Choice | Site: Knee | Laterality: Left

## 2021-10-26 ENCOUNTER — Encounter: Payer: Medicare Other | Admitting: Orthopaedic Surgery

## 2021-11-05 ENCOUNTER — Other Ambulatory Visit: Payer: Self-pay

## 2021-11-25 ENCOUNTER — Other Ambulatory Visit: Payer: Medicare Other

## 2021-11-25 DIAGNOSIS — E119 Type 2 diabetes mellitus without complications: Secondary | ICD-10-CM | POA: Diagnosis not present

## 2021-11-25 DIAGNOSIS — I10 Essential (primary) hypertension: Secondary | ICD-10-CM | POA: Diagnosis not present

## 2021-11-26 ENCOUNTER — Encounter: Payer: Self-pay | Admitting: Internal Medicine

## 2021-11-26 ENCOUNTER — Ambulatory Visit (INDEPENDENT_AMBULATORY_CARE_PROVIDER_SITE_OTHER): Payer: Medicare Other | Admitting: Internal Medicine

## 2021-11-26 VITALS — BP 140/80 | HR 58 | Temp 98.0°F | Wt 169.1 lb

## 2021-11-26 DIAGNOSIS — M1712 Unilateral primary osteoarthritis, left knee: Secondary | ICD-10-CM | POA: Diagnosis not present

## 2021-11-26 DIAGNOSIS — K219 Gastro-esophageal reflux disease without esophagitis: Secondary | ICD-10-CM

## 2021-11-26 DIAGNOSIS — I10 Essential (primary) hypertension: Secondary | ICD-10-CM | POA: Diagnosis not present

## 2021-11-26 DIAGNOSIS — Z96651 Presence of right artificial knee joint: Secondary | ICD-10-CM

## 2021-11-26 DIAGNOSIS — E785 Hyperlipidemia, unspecified: Secondary | ICD-10-CM

## 2021-11-26 DIAGNOSIS — R609 Edema, unspecified: Secondary | ICD-10-CM | POA: Diagnosis not present

## 2021-11-26 DIAGNOSIS — Z01818 Encounter for other preprocedural examination: Secondary | ICD-10-CM | POA: Diagnosis not present

## 2021-11-26 DIAGNOSIS — E1169 Type 2 diabetes mellitus with other specified complication: Secondary | ICD-10-CM | POA: Diagnosis not present

## 2021-11-26 DIAGNOSIS — Z6829 Body mass index (BMI) 29.0-29.9, adult: Secondary | ICD-10-CM | POA: Diagnosis not present

## 2021-11-26 LAB — HEMOGLOBIN A1C
Hgb A1c MFr Bld: 6 % of total Hgb — ABNORMAL HIGH (ref <5.7)
Mean Plasma Glucose: 126 mg/dL
eAG (mmol/L): 7 mmol/L

## 2021-11-26 LAB — BASIC METABOLIC PANEL
BUN: 19 mg/dL (ref 7–25)
CO2: 29 mmol/L (ref 20–32)
Calcium: 9.4 mg/dL (ref 8.6–10.4)
Chloride: 105 mmol/L (ref 98–110)
Creat: 1 mg/dL (ref 0.60–1.00)
Glucose, Bld: 65 mg/dL (ref 65–99)
Potassium: 4.4 mmol/L (ref 3.5–5.3)
Sodium: 142 mmol/L (ref 135–146)

## 2021-11-26 NOTE — Patient Instructions (Signed)
It was a pleasure to see you today.  Please try to monitor your blood pressure on days that you are taking your diuretic.  It would be preferable to take your diuretic if at all possible.  Please call if blood pressure is not under good control.  We may need to adjust your medication.  Your labs are excellent at the present time and stable.  Glucose control is excellent.  Follow-up here in 6 months for Medicare wellness and health maintenance exam.

## 2021-11-26 NOTE — Progress Notes (Signed)
Subjective:    Patient ID: Kathleen Greer, female    DOB: [birth year], 76 y.o.   MRN: [id number]  HPI 76 year old Female seen for 6 month recheck. Is to have left TKA August 22nd. She had EKG in June at pre-op center.  This was reviewed today.  Labs from yesterday reveal excellent Hgb AIC at 6% and Basic metabolic panel is normal.  She had CPE and Medicare wellness visit in February.  Her blood pressure is elevated today at 160/84.  Pulse is 58 and regular.  Blood pressure was rechecked and is 140/80.  She did not take diuretic before coming to the office today.  She is able to drive.  She lives with her 90 year old grandson.  History of colonoscopy in 08/16/13 which was normal and 10-year follow-up recommended.  There is a remote history of adenomatous colon polyps.  Hysterectomy without oophorectomy 1971.  Right rotator cuff surgery June 2018  Right carpal tunnel release 2001/08/16.  GE reflux treated with omeprazole  History of anxiety-improved over the years.  She is allergic to Penicillin-it causes a rash.  Social history: She is retired and formerly worked at [employer] as a Location manager.  Does not smoke or consume alcohol.  Family history: Adult son with hypertension.  Patient's father died with an MI.  Mother died of an MI.  1 brother died with heart failure.  Another brother died in Aug 16, 2004 with cirrhosis of the liver.  Sister died at age 43.  Another sister whose health is apparently stable.    Review of Systems is in pain from left knee. Some situational stress discussed at length.     Objective:   Physical Exam   BP 160 08-17-1982 pulse 58 but has not taken diuretic.  Recheck on blood pressure was 140/80. Skin: Warm and dry.  No cervical adenopathy or carotid bruits.  No thyromegaly.  Chest clear to auscultation without rales or wheezing.  Cardiac exam: Regular rate and rhythm without murmur or ectopy.  She does have pitting edema today of her lower extremities but has not taken  her diuretic.     Assessment & Plan:   Initial BP reading is elevated today.  Recheck on her blood pressure today is much better.  May need Benazepril (Benicar) instead of Losartan if blood pressure remains elevated when she is consistently taking her diuretic.  She did not take her diuretic because she was worried about frequent urination.  Blood pressure recheck was 140/80 which is much better.  I think it would be normal if she took her diuretic.  She currently is on losartan 100 mg daily, Demadex 20 mg 1 or 2 tablets daily, losartan 100 mg daily, amlodipine 10 mg daily.  Amlodipine dose of 10 mg likely contributes to dependent edema so it is important she take her diuretic.  One option would be to change losartan to benazepril, however I do not want to make these changes prior to her upcoming surgery.  End-stage osteoarthritis left knee to have TKA in the near future  GE reflux treated with omeprazole 20 mg daily  History of right rotator cuff surgery June 2018  Hysterectomy without oophorectomy 1971  History of anxiety-currently not on anti-anxiety medication  Impaired glucose tolerance treated with metformin and glipizide-Hemoglobin A1c is 6%  History of hypokalemia due to diuretic therapy and potassium is normal with potassium supplementation.  Situational stress-raising grandson who is father is in prison  BMI 29.96-continue with diet and exercise efforts however exercise  is limited right now due to left knee osteoarthritis

## 2021-11-29 ENCOUNTER — Other Ambulatory Visit: Payer: Self-pay | Admitting: Physician Assistant

## 2021-11-29 DIAGNOSIS — M1712 Unilateral primary osteoarthritis, left knee: Secondary | ICD-10-CM

## 2021-11-29 NOTE — Pre-Procedure Instructions (Signed)
Surgical Instructions    Your procedure is scheduled on December 07, 2021.  Report to [facility] Main Entrance "A" at 10:00 A.M., then check in with the Admitting office.  Call this number if you have problems the morning of surgery:  [phone]   If you have any questions prior to your surgery date call [phone]: Open Monday-Friday 8am-4pm    Remember:  Do not eat after midnight the night before your surgery  You may drink clear liquids until 9:00 AM the morning of your surgery.   Clear liquids allowed are: Water, Non-Citrus Juices (without pulp), Carbonated Beverages, Clear Tea, Black Coffee Only (NO MILK, CREAM OR POWDERED CREAMER of any kind), and Gatorade.  Patient Instructions  The night before surgery:  No food after midnight. ONLY clear liquids after midnight  The day of surgery (if you have diabetes): Drink ONE (1) 12 oz G2 given to you in your pre admission testing appointment by 9:00 AM the morning of surgery. Drink in one sitting. Do not sip.  This drink was given to you during your hospital  pre-op appointment visit.  Nothing else to drink after completing the  12 oz bottle of G2.         If you have questions, please contact your surgeon's office.     Take these medicines the morning of surgery with A SIP OF WATER:  amLODipine (NORVASC)   omeprazole (PRILOSEC)   rosuvastatin (CRESTOR)      Take these medicines the morning of surgery AS NEEDED:  fluticasone (FLONASE)   Tetrahydrozoline HCl (VISINE OP)  sodium chloride (OCEAN)   Follow your surgeon's instructions on when to stop Aspirin.  If no instructions were given by your surgeon then you will need to call the office to get those instructions.    As of today, STOP taking any Aleve, Naproxen, Ibuprofen, Motrin, Advil, Goody's, BC's, all herbal medications, fish oil, and all vitamins.  WHAT DO I DO ABOUT MY DIABETES MEDICATION?   Do not take your EVENING DOSE of glipiZIDE (GLUCOTROL) the day before  surgery.  Do not take glipiZIDE (GLUCOTROL) or metFORMIN (GLUCOPHAGE) the morning of surgery.   HOW TO MANAGE YOUR DIABETES BEFORE AND AFTER SURGERY  Why is it important to control my blood sugar before and after surgery? Improving blood sugar levels before and after surgery helps healing and can limit problems. A way of improving blood sugar control is eating a healthy diet by:  Eating less sugar and carbohydrates  Increasing activity/exercise  Talking with your doctor about reaching your blood sugar goals High blood sugars (greater than 180 mg/dL) can raise your risk of infections and slow your recovery, so you will need to focus on controlling your diabetes during the weeks before surgery. Make sure that the doctor who takes care of your diabetes knows about your planned surgery including the date and location.  How do I manage my blood sugar before surgery? Check your blood sugar at least 4 times a day, starting 2 days before surgery, to make sure that the level is not too high or low.  Check your blood sugar the morning of your surgery when you wake up and every 2 hours until you get to the Short Stay unit.  If your blood sugar is less than 70 mg/dL, you will need to treat for low blood sugar: Do not take insulin. Treat a low blood sugar (less than 70 mg/dL) with  cup of clear juice (cranberry or apple), 4 glucose  tablets, OR glucose gel. Recheck blood sugar in 15 minutes after treatment (to make sure it is greater than 70 mg/dL). If your blood sugar is not greater than 70 mg/dL on recheck, call [phone] for further instructions. Report your blood sugar to the short stay nurse when you get to Short Stay.  If you are admitted to the hospital after surgery: Your blood sugar will be checked by the staff and you will probably be given insulin after surgery (instead of oral diabetes medicines) to make sure you have good blood sugar levels. The goal for blood sugar control after  surgery is 80-180 mg/dL.                      Do NOT Smoke (Tobacco/Vaping) for 24 hours prior to your procedure.  If you use a CPAP at night, you may bring your mask/headgear for your overnight stay.   Contacts, glasses, piercing's, hearing aid's, dentures or partials may not be worn into surgery, please bring cases for these belongings.    For patients admitted to the hospital, discharge time will be determined by your treatment team.   Patients discharged the day of surgery will not be allowed to drive home, and someone needs to stay with them for 24 hours.  SURGICAL WAITING ROOM VISITATION Patients having surgery or a procedure may have no more than 2 support people in the waiting area - these visitors may rotate.   Children under the age of 18 must have an adult with them who is not the patient. If the patient needs to stay at the hospital during part of their recovery, the visitor guidelines for inpatient rooms apply. Pre-op nurse will coordinate an appropriate time for 1 support person to accompany patient in pre-op.  This support person may not rotate.   Please refer to the [facility] website for the visitor guidelines for Inpatients (after your surgery is over and you are in a regular room).    Special instructions:   [place]- Preparing For Surgery  Before surgery, you can play an important role. Because skin is not sterile, your skin needs to be as free of germs as possible. You can reduce the number of germs on your skin by washing with CHG (chlorahexidine gluconate) Soap before surgery.  CHG is an antiseptic cleaner which kills germs and bonds with the skin to continue killing germs even after washing.    Oral Hygiene is also important to reduce your risk of infection.  Remember - BRUSH YOUR TEETH THE MORNING OF SURGERY WITH YOUR REGULAR TOOTHPASTE  Please do not use if you have an allergy to CHG or antibacterial soaps. If your skin becomes reddened/irritated stop using  the CHG.  Do not shave (including legs and underarms) for at least 48 hours prior to first CHG shower. It is OK to shave your face.  Please follow these instructions carefully.   Shower the NIGHT BEFORE SURGERY and the MORNING OF SURGERY  If you chose to wash your hair, wash your hair first as usual with your normal shampoo.  After you shampoo, rinse your hair and body thoroughly to remove the shampoo.  Use CHG Soap as you would any other liquid soap. You can apply CHG directly to the skin and wash gently with a scrungie or a clean washcloth.   Apply the CHG Soap to your body ONLY FROM THE NECK DOWN.  Do not use on open wounds or open sores. Avoid contact with your  eyes, ears, mouth and genitals (private parts). Wash Face and genitals (private parts)  with your normal soap.   Wash thoroughly, paying special attention to the area where your surgery will be performed.  Thoroughly rinse your body with warm water from the neck down.  DO NOT shower/wash with your normal soap after using and rinsing off the CHG Soap.  Pat yourself dry with a CLEAN TOWEL.  Wear CLEAN PAJAMAS to bed the night before surgery  Place CLEAN SHEETS on your bed the night before your surgery  DO NOT SLEEP WITH PETS.   Day of Surgery: Take a shower with CHG soap. Do not wear jewelry or makeup Do not wear lotions, powders, perfumes/colognes, or deodorant. Do not shave 48 hours prior to surgery. Do not bring valuables to the hospital.  [facility] is not responsible for any belongings or valuables. Do not wear nail polish, gel polish, artificial nails, or any other type of covering on natural nails (fingers and toes) If you have artificial nails or gel coating that need to be removed by a nail salon, please have this removed prior to surgery. Artificial nails or gel coating may interfere with anesthesia's ability to adequately monitor your vital signs.  Wear Clean/Comfortable clothing the morning of  surgery Remember to brush your teeth WITH YOUR REGULAR TOOTHPASTE.   Please read over the following fact sheets that you were given.    If you received a COVID test during your pre-op visit  it is requested that you wear a mask when out in public, stay away from anyone that may not be feeling well and notify your surgeon if you develop symptoms. If you have been in contact with anyone that has tested positive in the last 10 days please notify you surgeon.

## 2021-11-30 ENCOUNTER — Encounter (HOSPITAL_COMMUNITY): Payer: Self-pay

## 2021-11-30 ENCOUNTER — Other Ambulatory Visit: Payer: Self-pay

## 2021-11-30 ENCOUNTER — Encounter (HOSPITAL_COMMUNITY)
Admission: RE | Admit: 2021-11-30 | Discharge: 2021-11-30 | Disposition: A | Discharge status: Home / Self Care | Payer: Medicare Other | Source: Ambulatory Visit | Attending: Orthopaedic Surgery | Admitting: Orthopaedic Surgery

## 2021-11-30 VITALS — BP 139/79 | HR 69 | Temp 98.0°F | Resp 17 | Ht 63.0 in | Wt 163.3 lb

## 2021-11-30 DIAGNOSIS — M1712 Unilateral primary osteoarthritis, left knee: Secondary | ICD-10-CM | POA: Diagnosis not present

## 2021-11-30 DIAGNOSIS — Z01812 Encounter for preprocedural laboratory examination: Secondary | ICD-10-CM | POA: Insufficient documentation

## 2021-11-30 DIAGNOSIS — E119 Type 2 diabetes mellitus without complications: Secondary | ICD-10-CM | POA: Insufficient documentation

## 2021-11-30 DIAGNOSIS — Z01818 Encounter for other preprocedural examination: Secondary | ICD-10-CM

## 2021-11-30 LAB — TYPE AND SCREEN
ABO/RH(D): A POS
Antibody Screen: NEGATIVE

## 2021-11-30 LAB — SURGICAL PCR SCREEN
MRSA, PCR: NEGATIVE
Staphylococcus aureus: NEGATIVE

## 2021-11-30 LAB — CBC
HCT: 39.6 % (ref 36.0–46.0)
Hemoglobin: 13.6 g/dL (ref 12.0–15.0)
MCH: 27.5 pg (ref 26.0–34.0)
MCHC: 34.3 g/dL (ref 30.0–36.0)
MCV: 80 fL (ref 80.0–100.0)
Platelets: 322 10*3/uL (ref 150–400)
RBC: 4.95 MIL/uL (ref 3.87–5.11)
RDW: 13.8 % (ref 11.5–15.5)
WBC: 6.3 10*3/uL (ref 4.0–10.5)
nRBC: 0 % (ref 0.0–0.2)

## 2021-11-30 LAB — GLUCOSE, CAPILLARY: Glucose-Capillary: 122 mg/dL — ABNORMAL HIGH (ref 70–99)

## 2021-11-30 NOTE — Progress Notes (Signed)
PCP - Dr. Marlan Palau Cardiologist - denies  PPM/ICD - denies   Chest x-ray - 09/26/09 EKG - 10/05/21 Stress Test - 09/28/09 ECHO - 09/27/09 Cardiac Cath - denies  Sleep Study - denies  DM- Type 2 Pt states she only "spot checks" her CBG if she feels she needs to, not often  Blood Thinner Instructions: n/a Aspirin Instructions: pt instructed to f/u with surgeon for instructions regarding ASA  ERAS Protcol - yes PRE-SURGERY G2-  given at PAT  COVID TEST- n/a   Anesthesia review: no  Patient denies shortness of breath, fever, cough and chest pain at PAT appointment   All instructions explained to the patient, with a verbal understanding of the material. Patient agrees to go over the instructions while at home for a better understanding. The opportunity to ask questions was provided.

## 2021-12-02 ENCOUNTER — Telehealth: Payer: Self-pay | Admitting: Orthopaedic Surgery

## 2021-12-02 NOTE — Telephone Encounter (Signed)
Patient called asked when should she stop taking Aspirin? The number to contact patient is [phone]

## 2021-12-02 NOTE — Telephone Encounter (Signed)
Called and advised pt.

## 2021-12-02 NOTE — Telephone Encounter (Signed)
Please advise 

## 2021-12-03 ENCOUNTER — Ambulatory Visit: Payer: Medicare Other | Admitting: Internal Medicine

## 2021-12-06 ENCOUNTER — Telehealth: Payer: Self-pay | Admitting: *Deleted

## 2021-12-06 NOTE — Care Plan (Signed)
[facility] RNCM call to patient to discuss her upcoming Left total knee arthroplasty with Dr. Magnus Ivan on 12/07/21. She had a previous Right TKR done in November of 2022. She is an ortho bundle patient through [facility] and is agreeable to case management. She has a grandson that will be assisting her at home after surgery. She still has her RW and has no other DME needs. Anticipate HHPT will be needed after a short hospital stay. Referral made to [facility] after choice provided. Reviewed all post op care instructions. Will continue to follow for needs.

## 2021-12-06 NOTE — Telephone Encounter (Signed)
Ortho bundle Pre-op call completed. 

## 2021-12-07 ENCOUNTER — Other Ambulatory Visit: Payer: Self-pay

## 2021-12-07 ENCOUNTER — Other Ambulatory Visit: Payer: Medicare Other

## 2021-12-07 ENCOUNTER — Observation Stay (HOSPITAL_COMMUNITY)
Admission: RE | Admit: 2021-12-07 | Discharge: 2021-12-08 | Disposition: A | Discharge status: Home / Self Care | Payer: Medicare Other | Attending: Orthopaedic Surgery | Admitting: Orthopaedic Surgery

## 2021-12-07 ENCOUNTER — Observation Stay (HOSPITAL_COMMUNITY): Payer: Medicare Other

## 2021-12-07 ENCOUNTER — Ambulatory Visit (HOSPITAL_COMMUNITY): Payer: Medicare Other | Admitting: Certified Registered"

## 2021-12-07 ENCOUNTER — Ambulatory Visit (HOSPITAL_BASED_OUTPATIENT_CLINIC_OR_DEPARTMENT_OTHER): Payer: Medicare Other | Admitting: Certified Registered"

## 2021-12-07 ENCOUNTER — Encounter (HOSPITAL_COMMUNITY)
Admission: RE | Disposition: A | Discharge status: Home / Self Care | Payer: Self-pay | Source: Home / Self Care | Attending: Orthopaedic Surgery

## 2021-12-07 ENCOUNTER — Encounter (HOSPITAL_COMMUNITY): Payer: Self-pay | Admitting: Orthopaedic Surgery

## 2021-12-07 DIAGNOSIS — M1712 Unilateral primary osteoarthritis, left knee: Principal | ICD-10-CM | POA: Insufficient documentation

## 2021-12-07 DIAGNOSIS — Z96652 Presence of left artificial knee joint: Secondary | ICD-10-CM | POA: Diagnosis not present

## 2021-12-07 DIAGNOSIS — E119 Type 2 diabetes mellitus without complications: Secondary | ICD-10-CM | POA: Insufficient documentation

## 2021-12-07 DIAGNOSIS — G8918 Other acute postprocedural pain: Secondary | ICD-10-CM | POA: Diagnosis not present

## 2021-12-07 DIAGNOSIS — I10 Essential (primary) hypertension: Secondary | ICD-10-CM | POA: Diagnosis not present

## 2021-12-07 DIAGNOSIS — Z471 Aftercare following joint replacement surgery: Secondary | ICD-10-CM | POA: Diagnosis not present

## 2021-12-07 DIAGNOSIS — Z96651 Presence of right artificial knee joint: Secondary | ICD-10-CM | POA: Diagnosis not present

## 2021-12-07 HISTORY — PX: TOTAL KNEE ARTHROPLASTY: SHX125

## 2021-12-07 LAB — GLUCOSE, CAPILLARY
Glucose-Capillary: 114 mg/dL — ABNORMAL HIGH (ref 70–99)
Glucose-Capillary: 115 mg/dL — ABNORMAL HIGH (ref 70–99)
Glucose-Capillary: 96 mg/dL (ref 70–99)
Glucose-Capillary: 84 mg/dL (ref 70–99)
Glucose-Capillary: 224 mg/dL — ABNORMAL HIGH (ref 70–99)

## 2021-12-07 SURGERY — ARTHROPLASTY, KNEE, TOTAL
Anesthesia: Monitor Anesthesia Care | Site: Knee | Laterality: Left

## 2021-12-07 MED ORDER — ACETAMINOPHEN 160 MG/5ML PO SOLN: 1000.0000 mg | Freq: Once | ORAL | Status: DC | PRN

## 2021-12-07 MED ORDER — ALUM & MAG HYDROXIDE-SIMETH 200-200-20 MG/5ML PO SUSP: 30.0000 mL | ORAL | Status: DC | PRN

## 2021-12-07 MED ORDER — CHLORHEXIDINE GLUCONATE 0.12 % MT SOLN
15.0000 mL | Freq: Once | OROMUCOSAL | Status: AC
  Administered 2021-12-07: 15 mL via OROMUCOSAL
  Filled 2021-12-07: qty 15

## 2021-12-07 MED ORDER — METHOCARBAMOL 1000 MG/10ML IJ SOLN: 500.0000 mg | Freq: Four times a day (QID) | INTRAVENOUS | Status: DC | PRN

## 2021-12-07 MED ORDER — VANCOMYCIN HCL IN DEXTROSE 1-5 GM/200ML-% IV SOLN
1000.0000 mg | INTRAVENOUS | Status: AC
  Filled 2021-12-07: qty 200

## 2021-12-07 MED ORDER — METOCLOPRAMIDE HCL 5 MG/ML IJ SOLN: 5.0000 mg | Freq: Three times a day (TID) | INTRAMUSCULAR | Status: DC | PRN

## 2021-12-07 MED ORDER — ACETAMINOPHEN 325 MG PO TABS: 325.0000 mg | ORAL_TABLET | Freq: Four times a day (QID) | ORAL | Status: DC | PRN

## 2021-12-07 MED ORDER — METOCLOPRAMIDE HCL 5 MG PO TABS: 5.0000 mg | ORAL_TABLET | Freq: Three times a day (TID) | ORAL | Status: DC | PRN

## 2021-12-07 MED ORDER — VANCOMYCIN HCL IN DEXTROSE 1-5 GM/200ML-% IV SOLN: 1000.0000 mg | Freq: Two times a day (BID) | INTRAVENOUS | Status: DC

## 2021-12-07 MED ORDER — PROPOFOL 10 MG/ML IV BOLUS
INTRAVENOUS | Status: DC | PRN
  Administered 2021-12-07: 100 ug/kg/min via INTRAVENOUS

## 2021-12-07 MED ORDER — MENTHOL 3 MG MT LOZG: 1.0000 | LOZENGE | OROMUCOSAL | Status: DC | PRN

## 2021-12-07 MED ORDER — ORAL CARE MOUTH RINSE: 15.0000 mL | Freq: Once | OROMUCOSAL | Status: AC

## 2021-12-07 MED ORDER — MIDAZOLAM HCL 2 MG/2ML IJ SOLN
INTRAMUSCULAR | Status: DC | PRN
  Administered 2021-12-07: 2 mg via INTRAVENOUS

## 2021-12-07 MED ORDER — LACTATED RINGERS IV SOLN: INTRAVENOUS | Status: DC

## 2021-12-07 MED ORDER — METFORMIN HCL 500 MG PO TABS
1000.0000 mg | ORAL_TABLET | Freq: Every day | ORAL | Status: DC
  Administered 2021-12-08: 1000 mg via ORAL
  Filled 2021-12-07: qty 2

## 2021-12-07 MED ORDER — TORSEMIDE 20 MG PO TABS
20.0000 mg | ORAL_TABLET | Freq: Every day | ORAL | Status: DC
  Administered 2021-12-08: 20 mg via ORAL
  Filled 2021-12-07: qty 1

## 2021-12-07 MED ORDER — POLYETHYLENE GLYCOL 3350 17 G PO PACK: 17.0000 g | PACK | Freq: Every day | ORAL | Status: DC | PRN

## 2021-12-07 MED ORDER — EPHEDRINE SULFATE-NACL 50-0.9 MG/10ML-% IV SOSY
PREFILLED_SYRINGE | INTRAVENOUS | Status: DC | PRN
  Administered 2021-12-07 (×2): 5 mg via INTRAVENOUS

## 2021-12-07 MED ORDER — OXYCODONE HCL 5 MG PO TABS: 10.0000 mg | ORAL_TABLET | ORAL | Status: DC | PRN

## 2021-12-07 MED ORDER — HYDROMORPHONE HCL 1 MG/ML IJ SOLN: 0.5000 mg | INTRAMUSCULAR | Status: DC | PRN

## 2021-12-07 MED ORDER — MIDAZOLAM HCL 2 MG/2ML IJ SOLN
INTRAMUSCULAR | Status: AC
  Filled 2021-12-07: qty 2

## 2021-12-07 MED ORDER — VITAMIN D 25 MCG (1000 UNIT) PO TABS
2000.0000 [IU] | ORAL_TABLET | Freq: Every day | ORAL | Status: DC
Start: 2021-12-08 — End: 2021-12-08
  Administered 2021-12-08: 2000 [IU] via ORAL
  Filled 2021-12-07: qty 2

## 2021-12-07 MED ORDER — OXYCODONE HCL 5 MG PO TABS
5.0000 mg | ORAL_TABLET | ORAL | Status: DC | PRN
  Administered 2021-12-07 – 2021-12-08 (×4): 10 mg via ORAL
  Filled 2021-12-07 (×4): qty 2

## 2021-12-07 MED ORDER — POVIDONE-IODINE 10 % EX SWAB
2.0000 | Freq: Once | CUTANEOUS | Status: AC
  Administered 2021-12-07: 2 via TOPICAL

## 2021-12-07 MED ORDER — LOSARTAN POTASSIUM 50 MG PO TABS
100.0000 mg | ORAL_TABLET | Freq: Every day | ORAL | Status: DC
  Administered 2021-12-08: 100 mg via ORAL
  Filled 2021-12-07: qty 2

## 2021-12-07 MED ORDER — AMLODIPINE BESYLATE 5 MG PO TABS
10.0000 mg | ORAL_TABLET | Freq: Every day | ORAL | Status: DC
  Administered 2021-12-08: 10 mg via ORAL
  Filled 2021-12-07: qty 2

## 2021-12-07 MED ORDER — ONDANSETRON HCL 4 MG/2ML IJ SOLN: 4.0000 mg | Freq: Four times a day (QID) | INTRAMUSCULAR | Status: DC | PRN

## 2021-12-07 MED ORDER — SODIUM CHLORIDE 0.9 % IV SOLN: INTRAVENOUS | Status: DC

## 2021-12-07 MED ORDER — METHOCARBAMOL 500 MG PO TABS
500.0000 mg | ORAL_TABLET | Freq: Four times a day (QID) | ORAL | Status: DC | PRN
  Administered 2021-12-07 – 2021-12-08 (×2): 500 mg via ORAL
  Filled 2021-12-07 (×2): qty 1

## 2021-12-07 MED ORDER — ACETAMINOPHEN 10 MG/ML IV SOLN
1000.0000 mg | Freq: Once | INTRAVENOUS | Status: DC | PRN
Start: 2021-12-07 — End: 2021-12-07

## 2021-12-07 MED ORDER — SODIUM CHLORIDE 0.9 % IR SOLN
Status: DC | PRN
  Administered 2021-12-07: 1000 mL

## 2021-12-07 MED ORDER — ACETAMINOPHEN 10 MG/ML IV SOLN
INTRAVENOUS | Status: AC
  Filled 2021-12-07: qty 100

## 2021-12-07 MED ORDER — ROSUVASTATIN CALCIUM 5 MG PO TABS
5.0000 mg | ORAL_TABLET | Freq: Every day | ORAL | Status: DC
  Administered 2021-12-08: 5 mg via ORAL
  Filled 2021-12-07: qty 1

## 2021-12-07 MED ORDER — PHENOL 1.4 % MT LIQD: 1.0000 | OROMUCOSAL | Status: DC | PRN

## 2021-12-07 MED ORDER — 0.9 % SODIUM CHLORIDE (POUR BTL) OPTIME
TOPICAL | Status: DC | PRN
  Administered 2021-12-07: 1000 mL

## 2021-12-07 MED ORDER — OXYCODONE HCL 5 MG/5ML PO SOLN: 5.0000 mg | Freq: Once | ORAL | Status: DC | PRN

## 2021-12-07 MED ORDER — PANTOPRAZOLE SODIUM 40 MG PO TBEC
40.0000 mg | DELAYED_RELEASE_TABLET | Freq: Every day | ORAL | Status: DC
  Administered 2021-12-07 – 2021-12-08 (×2): 40 mg via ORAL
  Filled 2021-12-07 (×2): qty 1

## 2021-12-07 MED ORDER — TRANEXAMIC ACID-NACL 1000-0.7 MG/100ML-% IV SOLN
1000.0000 mg | INTRAVENOUS | Status: AC
  Administered 2021-12-07: 1000 mg via INTRAVENOUS
  Filled 2021-12-07: qty 100

## 2021-12-07 MED ORDER — VANCOMYCIN HCL IN DEXTROSE 1-5 GM/200ML-% IV SOLN
INTRAVENOUS | Status: AC
  Administered 2021-12-07: 1000 mg via INTRAVENOUS
  Filled 2021-12-07: qty 200

## 2021-12-07 MED ORDER — ACETAMINOPHEN 10 MG/ML IV SOLN
INTRAVENOUS | Status: DC | PRN
  Administered 2021-12-07: 1000 mg via INTRAVENOUS

## 2021-12-07 MED ORDER — ONDANSETRON HCL 4 MG PO TABS: 4.0000 mg | ORAL_TABLET | Freq: Four times a day (QID) | ORAL | Status: DC | PRN

## 2021-12-07 MED ORDER — VANCOMYCIN HCL IN DEXTROSE 1-5 GM/200ML-% IV SOLN
1000.0000 mg | Freq: Once | INTRAVENOUS | Status: AC
  Administered 2021-12-08: 1000 mg via INTRAVENOUS
  Filled 2021-12-07: qty 200

## 2021-12-07 MED ORDER — POTASSIUM CHLORIDE CRYS ER 20 MEQ PO TBCR
20.0000 meq | EXTENDED_RELEASE_TABLET | Freq: Every day | ORAL | Status: DC
  Administered 2021-12-08: 20 meq via ORAL
  Filled 2021-12-07: qty 1

## 2021-12-07 MED ORDER — GLIPIZIDE 5 MG PO TABS
5.0000 mg | ORAL_TABLET | Freq: Two times a day (BID) | ORAL | Status: DC
  Administered 2021-12-07 – 2021-12-08 (×2): 5 mg via ORAL
  Filled 2021-12-07 (×2): qty 1

## 2021-12-07 MED ORDER — OXYCODONE HCL 5 MG PO TABS: 5.0000 mg | ORAL_TABLET | Freq: Once | ORAL | Status: DC | PRN

## 2021-12-07 MED ORDER — FENTANYL CITRATE (PF) 100 MCG/2ML IJ SOLN
INTRAMUSCULAR | Status: AC
  Filled 2021-12-07: qty 2

## 2021-12-07 MED ORDER — DOCUSATE SODIUM 100 MG PO CAPS
100.0000 mg | ORAL_CAPSULE | Freq: Two times a day (BID) | ORAL | Status: DC
  Administered 2021-12-07 – 2021-12-08 (×2): 100 mg via ORAL
  Filled 2021-12-07 (×2): qty 1

## 2021-12-07 MED ORDER — FENTANYL CITRATE (PF) 100 MCG/2ML IJ SOLN: 25.0000 ug | INTRAMUSCULAR | Status: DC | PRN

## 2021-12-07 MED ORDER — ACETAMINOPHEN 500 MG PO TABS: 1000.0000 mg | ORAL_TABLET | Freq: Once | ORAL | Status: DC | PRN

## 2021-12-07 MED ORDER — ASPIRIN 81 MG PO CHEW
81.0000 mg | CHEWABLE_TABLET | Freq: Two times a day (BID) | ORAL | Status: DC
  Administered 2021-12-07 – 2021-12-08 (×2): 81 mg via ORAL
  Filled 2021-12-07 (×2): qty 1

## 2021-12-07 SURGICAL SUPPLY — 74 items
BAG COUNTER SPONGE SURGICOUNT (BAG) ×2 IMPLANT
BAG SPNG CNTER NS LX DISP (BAG) ×1
BANDAGE ESMARK 6X9 LF (GAUZE/BANDAGES/DRESSINGS) ×2 IMPLANT
BLADE SAG 18X100X1.27 (BLADE) ×2 IMPLANT
BNDG CMPR 9X6 STRL LF SNTH (GAUZE/BANDAGES/DRESSINGS) ×1
BNDG ELASTIC 6X5.8 VLCR STR LF (GAUZE/BANDAGES/DRESSINGS) ×4 IMPLANT
BNDG ESMARK 6X9 LF (GAUZE/BANDAGES/DRESSINGS) ×1
BOWL SMART MIX CTS (DISPOSABLE) ×2 IMPLANT
BSPLAT TIB 5D C CMNT STM LT (Knees) ×1 IMPLANT
CEMENT BONE R 1X40 (Cement) ×4 IMPLANT
COMP FEM CMT CR PERS SZ8 LT (Joint) ×1 IMPLANT
COMPONENT FEM CMT CR PRS SZ8LT (Joint) IMPLANT
COOLER ICEMAN CLASSIC (MISCELLANEOUS) IMPLANT
COVER SURGICAL LIGHT HANDLE (MISCELLANEOUS) ×2 IMPLANT
CUFF TOURN SGL QUICK 34 (TOURNIQUET CUFF) ×1
CUFF TOURN SGL QUICK 42 (TOURNIQUET CUFF) IMPLANT
CUFF TRNQT CYL 34X4.125X (TOURNIQUET CUFF) ×2 IMPLANT
DRAPE EXTREMITY T 121X128X90 (DISPOSABLE) ×2 IMPLANT
DRAPE HALF SHEET 40X57 (DRAPES) ×2 IMPLANT
DRAPE U-SHAPE 47X51 STRL (DRAPES) ×2 IMPLANT
DRSG PAD ABDOMINAL 8X10 ST (GAUZE/BANDAGES/DRESSINGS) ×2 IMPLANT
DURAPREP 26ML APPLICATOR (WOUND CARE) ×2 IMPLANT
ELECT CAUTERY BLADE 6.4 (BLADE) ×2 IMPLANT
ELECT REM PT RETURN 9FT ADLT (ELECTROSURGICAL) ×1
ELECTRODE REM PT RTRN 9FT ADLT (ELECTROSURGICAL) ×2 IMPLANT
FACESHIELD WRAPAROUND (MASK) ×2 IMPLANT
FACESHIELD WRAPAROUND OR TEAM (MASK) ×4 IMPLANT
GAUZE SPONGE 4X4 12PLY STRL (GAUZE/BANDAGES/DRESSINGS) ×2 IMPLANT
GAUZE XEROFORM 1X8 LF (GAUZE/BANDAGES/DRESSINGS) ×2 IMPLANT
GLOVE BIOGEL PI IND STRL 8 (GLOVE) ×4 IMPLANT
GLOVE BIOGEL PI INDICATOR 8 (GLOVE) ×2
GLOVE ORTHO TXT STRL SZ7.5 (GLOVE) ×2 IMPLANT
GLOVE SURG ORTHO 8.0 STRL STRW (GLOVE) ×2 IMPLANT
GOWN STRL REUS W/ TWL LRG LVL3 (GOWN DISPOSABLE) IMPLANT
GOWN STRL REUS W/ TWL XL LVL3 (GOWN DISPOSABLE) ×4 IMPLANT
GOWN STRL REUS W/TWL LRG LVL3 (GOWN DISPOSABLE)
GOWN STRL REUS W/TWL XL LVL3 (GOWN DISPOSABLE) ×2
HANDPIECE INTERPULSE COAX TIP (DISPOSABLE) ×1
HDLS TROCR DRIL PIN KNEE 75 (PIN) ×1
IMMOBILIZER KNEE 22 UNIV (SOFTGOODS) ×2 IMPLANT
IV NS 1000ML (IV SOLUTION) ×1
IV NS 1000ML BAXH (IV SOLUTION) ×2 IMPLANT
KIT BASIN OR (CUSTOM PROCEDURE TRAY) ×2 IMPLANT
KIT TURNOVER KIT B (KITS) ×2 IMPLANT
LINER TIB PS CD/8-9 12 LT (Liner) IMPLANT
MANIFOLD NEPTUNE II (INSTRUMENTS) ×2 IMPLANT
NDL 18GX1X1/2 (RX/OR ONLY) (NEEDLE) IMPLANT
NEEDLE 18GX1X1/2 (RX/OR ONLY) (NEEDLE) IMPLANT
NS IRRIG 1000ML POUR BTL (IV SOLUTION) ×2 IMPLANT
PACK TOTAL JOINT (CUSTOM PROCEDURE TRAY) ×2 IMPLANT
PAD ARMBOARD 7.5X6 YLW CONV (MISCELLANEOUS) ×2 IMPLANT
PAD COLD SHLDR WRAP-ON (PAD) IMPLANT
PADDING CAST COTTON 6X4 STRL (CAST SUPPLIES) ×2 IMPLANT
PIN DRILL HDLS TROCAR 75 4PK (PIN) IMPLANT
SCREW FEMALE HEX FIX 25X2.5 (ORTHOPEDIC DISPOSABLE SUPPLIES) IMPLANT
SET HNDPC FAN SPRY TIP SCT (DISPOSABLE) ×2 IMPLANT
SET PAD KNEE POSITIONER (MISCELLANEOUS) ×2 IMPLANT
STAPLER VISISTAT 35W (STAPLE) ×2 IMPLANT
STEM POLY PAT PLY 32M KNEE (Knees) IMPLANT
STEM TIBIA 5 DEG SZ C L KNEE (Knees) IMPLANT
SUCTION FRAZIER HANDLE 10FR (MISCELLANEOUS) ×1
SUCTION TUBE FRAZIER 10FR DISP (MISCELLANEOUS) ×2 IMPLANT
SUT VIC AB 0 CT1 27 (SUTURE) ×1
SUT VIC AB 0 CT1 27XBRD ANBCTR (SUTURE) ×2 IMPLANT
SUT VIC AB 1 CT1 27 (SUTURE) ×2
SUT VIC AB 1 CT1 27XBRD ANBCTR (SUTURE) ×4 IMPLANT
SUT VIC AB 2-0 CT1 27 (SUTURE) ×2
SUT VIC AB 2-0 CT1 TAPERPNT 27 (SUTURE) ×4 IMPLANT
SYR 50ML LL SCALE MARK (SYRINGE) IMPLANT
TIBIA STEM 5 DEG SZ C L KNEE (Knees) ×1 IMPLANT
TOWEL GREEN STERILE (TOWEL DISPOSABLE) ×2 IMPLANT
TOWEL GREEN STERILE FF (TOWEL DISPOSABLE) ×2 IMPLANT
TRAY CATH 16FR W/PLASTIC CATH (SET/KITS/TRAYS/PACK) IMPLANT
WRAP KNEE MAXI GEL POST OP (GAUZE/BANDAGES/DRESSINGS) ×2 IMPLANT

## 2021-12-07 NOTE — Transfer of Care (Signed)
Immediate Anesthesia Transfer of Care Note  Patient: Kathleen Greer  Procedure(s) Performed: LEFT TOTAL KNEE ARTHROPLASTY (Left: Knee)  Patient Location: PACU  Anesthesia Type:Spinal  Level of Consciousness: awake, alert  and oriented  Airway & Oxygen Therapy: Patient Spontanous Breathing  Post-op Assessment: Report given to RN and Post -op Vital signs reviewed and stable  Post vital signs: Reviewed and stable  Last Vitals:  Vitals Value Taken Time  BP 108/64 12/07/21 1415  Temp    Pulse 73 12/07/21 1417  Resp 24 12/07/21 1417  SpO2 95 % 12/07/21 1417  Vitals shown include unvalidated device data.  Last Pain:  Vitals:   12/07/21 1045  TempSrc:   PainSc: 3          Complications: No notable events documented.

## 2021-12-07 NOTE — H&P (Signed)
TOTAL KNEE ADMISSION H&P  Patient is being admitted for left total knee arthroplasty.  Subjective:  Chief Complaint:left knee pain.  HPI: Kathleen Greer, 76 y.o. female, has a history of pain and functional disability in the left knee due to arthritis and has failed non-surgical conservative treatments for greater than 12 weeks to includeNSAID's and/or analgesics, corticosteriod injections, viscosupplementation injections, flexibility and strengthening excercises, use of assistive devices, and activity modification.  Onset of symptoms was gradual, starting 1 years ago with gradually worsening course since that time. The patient noted no past surgery on the left knee(s).  Patient currently rates pain in the left knee(s) at 10 out of 10 with activity. Patient has night pain, worsening of pain with activity and weight bearing, pain that interferes with activities of daily living, pain with passive range of motion, crepitus, and joint swelling.  Patient has evidence of subchondral sclerosis, periarticular osteophytes, and joint space narrowing by imaging studies. There is no active infection.  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   Unilateral primary osteoarthritis, left knee 06/17/2020   Unilateral primary osteoarthritis, right knee 06/17/2020   Osteoarthritis of right knee 09/15/2017   Epistaxis 07/12/2016   Impaired glucose tolerance 09/04/2015   Metabolic syndrome 08/08/2014   Anxiety 06/02/2011   Hypertension 01/12/2011   GE reflux 01/12/2011   Dependent edema 01/12/2011   Benign positional vertigo 01/12/2011   Hx of adenomatous colonic polyps 01/12/2011   Past Medical History:  Diagnosis Date   Arthritis    Blood transfusion without reported diagnosis 04/18/1968   after hysterectomy   Dependent edema    Diabetes (HCC)    type 2   Generalized headaches    GERD (gastroesophageal reflux disease)    Hyperkalemia     Hypertension    IBS (irritable bowel syndrome)    Positional vertigo    Scoliosis     Past Surgical History:  Procedure Laterality Date   ABDOMINAL HYSTERECTOMY  1970   CARPAL TUNNEL RELEASE Right 2003   KNEE ARTHROSCOPY Right 2010   ROTATOR CUFF REPAIR Right 6/02, 3/03   TOTAL KNEE ARTHROPLASTY Right 02/18/2021   Procedure: RIGHT TOTAL KNEE ARTHROPLASTY;  Surgeon: Kathryne Hitch, MD;  Location: [facility] OR;  Service: Orthopedics;  Laterality: Right;    Current Facility-Administered Medications  Medication Dose Route Frequency Provider Last Rate Last Admin   fentaNYL (SUBLIMAZE) 100 MCG/2ML injection            lactated ringers infusion   Intravenous Continuous Heather Roberts, MD 10 mL/hr at 12/07/21 1059 New Bag at 12/07/21 1059   midazolam (VERSED) 2 MG/2ML injection            tranexamic acid (CYKLOKAPRON) IVPB 1,000 mg  1,000 mg Intravenous To OR Richardean Canal W, PA-C       vancomycin (VANCOCIN) IVPB 1000 mg/200 mL premix  1,000 mg Intravenous On Call to OR Kirtland Bouchard, PA-C 200 mL/hr at 12/07/21 1114 1,000 mg at 12/07/21 1114   Allergies  Allergen Reactions   Penicillins     Skin irritation    Social History   Tobacco Use   Smoking status: Never   Smokeless tobacco: Never  Substance Use Topics   Alcohol use: No    Family History  Problem Relation Age of Onset   Heart disease Mother    Stroke Mother    Hypertension Mother    Heart disease Father    Heart  disease Brother    Hypertension Son    Breast cancer Sister    Colon cancer Neg Hx      Review of Systems  Musculoskeletal:  Positive for gait problem and joint swelling.  All other systems reviewed and are negative.   Objective:  Physical Exam Vitals reviewed.  Constitutional:      Appearance: Normal appearance.  HENT:     Head: Atraumatic.  Eyes:     Extraocular Movements: Extraocular movements intact.     Pupils: Pupils are equal, round, and reactive to light.  Cardiovascular:     Rate and  Rhythm: Normal rate and regular rhythm.  Pulmonary:     Effort: Pulmonary effort is normal.     Breath sounds: Normal breath sounds.  Abdominal:     Palpations: Abdomen is soft.  Musculoskeletal:     Cervical back: Normal range of motion and neck supple.     Left knee: Effusion, bony tenderness and crepitus present. Decreased range of motion. Tenderness present over the medial joint line, lateral joint line and patellar tendon. Abnormal alignment.  Neurological:     Mental Status: She is alert and oriented to person, place, and time.  Psychiatric:        Behavior: Behavior normal.     Vital signs in last 24 hours: Temp:  [98.1 F (36.7 C)] 98.1 F (36.7 C) (08/22 1010) Pulse Rate:  [68] 68 (08/22 1010) Resp:  [17] 17 (08/22 1010) BP: (142)/(74) 142/74 (08/22 1010) SpO2:  [96 %] 96 % (08/22 1010) Weight:  [76.2 kg] 76.2 kg (08/22 1010)  Labs:   Estimated body mass index is 29.76 kg/m as calculated from the following:   Height as of this encounter: 5\' 3"  (1.6 m).   Weight as of this encounter: 76.2 kg.   Imaging Review Plain radiographs demonstrate severe degenerative joint disease of the left knee(s). The overall alignment ismild varus. The bone quality appears to be good for age and reported activity level.      Assessment/Plan:  End stage arthritis, left knee   The patient history, physical examination, clinical judgment of the provider and imaging studies are consistent with end stage degenerative joint disease of the left knee(s) and total knee arthroplasty is deemed medically necessary. The treatment options including medical management, injection therapy arthroscopy and arthroplasty were discussed at length. The risks and benefits of total knee arthroplasty were presented and reviewed. The risks due to aseptic loosening, infection, stiffness, patella tracking problems, thromboembolic complications and other imponderables were discussed. The patient acknowledged the  explanation, agreed to proceed with the plan and consent was signed. Patient is being admitted for inpatient treatment for surgery, pain control, PT, OT, prophylactic antibiotics, VTE prophylaxis, progressive ambulation and ADL's and discharge planning. The patient is planning to be discharged home with home health services

## 2021-12-07 NOTE — Op Note (Signed)
Operative Note  Date of operation: 12/07/2021 Preoperative diagnosis: Left knee osteoarthritis and degenerative joint disease Postoperative diagnosis: Same  Procedure: Left cemented total knee arthroplasty  Implants: Biomet/Zimmer persona cemented knee system with size 8 narrow CR femur, size C left tibial tray, 12 mm fixed-bearing medial congruent left polyethylene insert, 32 mm patella button  Surgeon: Vanita Panda. Magnus Ivan, MD Assistant: Rexene Edison, PA-C  Anesthesia: #1 left lower extremity regional block, #2 spinal Tourniquet time: Less than 1 hour EBL: Less than 100 cc Antibiotics: IV vancomycin Complications: None  Indications: The patient is a 76 year old female well-known to me.  She has well-documented end-stage arthritis of her left knee.  She has tried and failed conservative treatment for over a year now and does wish to proceed with a total knee arthroplasty on the left side.  We replaced her right knee successfully in November 2022.  At this point her left knee pain is daily and it is detrimentally affecting her mobility, her quality of life, and her actives daily living.  With having surgery she is fully aware of the risk of acute blood loss anemia, nerve or vessel injury, infection, fracture, DVT and implant failure.  She understands her goals are hopefully decrease pain, improve mobility, and improve quality of life.  Procedure description: After informed consent was obtained and the appropriate left knee was marked, regional anesthesia was obtained of the left lower extremity with an adductor canal block in the holding room of the left lower extremity.  She was then brought to the operating room and set up on the operating table where spinal anesthesia was obtained.  She was laid in supine position on the operating table and a Foley catheter was placed.  A nonsterile tourniquet is placed around her upper left thigh and her left thigh, knee, leg, ankle and foot were prepped  and draped with DuraPrep and sterile drapes including a sterile stockinette.  A timeout was called and she was identified this correct patient correct left knee.  An Esmarch was used to wrap the leg and the tourniquet was inflated to 300 mm of pressure.  I then made a direct midline incision over the patella and carried this proximally and distally.  I dissected down to the knee joint carried out a medial parapatellar arthrotomy showing a large joint effusion.  With the knee in a flexed position we found significant cartilage wear in all 3 compartments of the knee.  We removed osteophytes from all 3 compartments as well as remnants of the ACL, medial and lateral meniscus.  We then used an intramedullary cutting guide for making her proximal tibia cut correction for varus and valgus and 3 degree slope.  We may discover taking 2 mm off the low side.  The cut was made without difficulty.  We then used the intramedullary guide for making her distal femoral cut setting this for left knee at 5 degrees externally rotated and a 10 mm distal femoral cut.  Neck cut was made without difficulty and brought the knee down to full extension and with a 10 mm extension block at the achieved full extension.  We then went back to the femur and put a femoral sizing guide based off the epicondylar axis.  Based off of this we chose a size 8 femur.  We put a 4-in-1 cutting block for a size 8 femur and made our anterior and posterior cuts followed by our chamfer cuts.  We then moved back to the tibia and chose a  size C left tibial tray for coverage over the tibial plateau setting the rotation of the tibial tubercle and the femur.  We did our drill hole and keel punch off of this.  With the size C left trial tibia we then trialed a size 8 left femur and a 10 mm medial congruent trial poly insert.  We went up to a 12 mm insert and I was pleased with range of motion and stability with the 12 mm insert.  We then made a patella cut and drilled  3 holes for a size 32 patella button.  With all trial instrumentation and a we put her through several cycles of motion and we are pleased with range of motion and stability with those trial instrumentation.  We then removed all transportation and irrigated knee with normal saline solution using pulsatile lavage.  We dried the knee real well and then flexed the knee.  The cement was mixed and we then cemented our real Biomet Zimmer persona left tibial tray size C followed by a size 8 narrow left CR femur, we placed a 12 mm medial congruent fixed-bearing poly insert and cemented our size 32 patella button.  We removed all excess cement debris from the knee and then held the knee completely extended and compressed while the cement hardened.  Once it hardened we put her through several cycles of motion and I was pleased with range of motion and stability.  We let the tourniquet down and hemostasis was obtained electrocautery.  The arthrotomy was closed with interrupted #1 Vicryl suture followed by 0 Vicryl close deep tissue and 2-0 Vicryl close subcutaneous tissue the skin was closed with staples well-padded sterile dressing was applied.  She was taken off of the operating table and taken to recovery room in stable condition with all final counts being correct and no complications noted.  Of note Rexene Edison, PA-C assisted during the entire case from beginning to end and his assistance was medically necessary for helping retract soft tissues, guiding implant placement, and a layered closure of the wound.

## 2021-12-07 NOTE — Anesthesia Procedure Notes (Signed)
Procedure Name: MAC Date/Time: 12/07/2021 12:22 PM  Performed by: Anastasio Auerbach, CRNAPre-anesthesia Checklist: Emergency Drugs available, Suction available and Patient being monitored Oxygen Delivery Method: Nasal cannula Induction Type: IV induction

## 2021-12-07 NOTE — Evaluation (Signed)
Physical Therapy Evaluation Patient Details Name: Kathleen Greer MRN: [id number] DOB: 1945-04-26 Today's Date: 12/07/2021  History of Present Illness  Pt is 76 yo female s/p L TKA on 12/07/21.  Pt with hx including R TKA 2022, HTN, DM, vertigo  Clinical Impression  Pt is s/p TKA resulting in the deficits listed below (see PT Problem List). At baseline, pt is independent, likes to garden, raising her 75 yo great grandson, and does not use AD.  Pt was seen for evaluation on DOS.  She had good quad activation and fair pain control (requested meds post PT).  Pt was able to ambulate 59' with RW , min guard, tolerated well.  Pt very motivated and expected to progress well. Reports she did very well after R TKA. Pt will benefit from skilled PT to increase their independence and safety with mobility to allow discharge to the venue listed below.         Recommendations for follow up therapy are one component of a multi-disciplinary discharge planning process, led by the attending physician.  Recommendations may be updated based on patient status, additional functional criteria and insurance authorization.  Follow Up Recommendations Follow physician's recommendations for discharge plan and follow up therapies      Assistance Recommended at Discharge Intermittent Supervision/Assistance  Patient can return home with the following  A little help with walking and/or transfers;A little help with bathing/dressing/bathroom;Assistance with cooking/housework;Help with stairs or ramp for entrance    Equipment Recommendations None recommended by PT  Recommendations for Other Services       Functional Status Assessment Patient has had a recent decline in their functional status and demonstrates the ability to make significant improvements in function in a reasonable and predictable amount of time.     Precautions / Restrictions Precautions Precautions: Fall Required Braces or Orthoses: Knee Immobilizer -  Left Knee Immobilizer - Left: Discontinue once straight leg raise with < 10 degree lag Restrictions Weight Bearing Restrictions: Yes LLE Weight Bearing: Weight bearing as tolerated      Mobility  Bed Mobility Overal bed mobility: Needs Assistance Bed Mobility: Supine to Sit     Supine to sit: Supervision          Transfers Overall transfer level: Needs assistance Equipment used: Rolling walker (2 wheels) Transfers: Sit to/from Stand Sit to Stand: Min guard           General transfer comment: Min cues for L LE management    Ambulation/Gait Ambulation/Gait assistance: Min guard Gait Distance (Feet): 80 Feet Assistive device: Rolling walker (2 wheels) Gait Pattern/deviations: Step-to pattern, Decreased stride length Gait velocity: decreased     General Gait Details: Min cues for sequencing initially; mild antalgic pattern ; able to WB on L LE  Stairs            Wheelchair Mobility    Modified Rankin (Stroke Patients Only)       Balance Overall balance assessment: Needs assistance Sitting-balance support: No upper extremity supported Sitting balance-Leahy Scale: Good     Standing balance support: No upper extremity supported, Bilateral upper extremity supported Standing balance-Leahy Scale: Fair Standing balance comment: RW to ambulate but could static stand                             Pertinent Vitals/Pain Pain Assessment Pain Assessment: 0-10 Pain Score: 6  Pain Location: L knee Pain Descriptors / Indicators: Discomfort Pain Intervention(s): Limited activity within patient's  tolerance, Monitored during session, Repositioned, Ice applied, Patient requesting pain meds-RN notified    Home Living Family/patient expects to be discharged to:: Private residence Living Arrangements: Other relatives (great grandson (5 yo)) Available Help at Discharge: Family;Available 24 hours/day Type of Home: House Home Access: Stairs to  enter Entrance Stairs-Rails: None Entrance Stairs-Number of Steps: 3   Home Layout: One level Home Equipment: Agricultural consultant (2 wheels);Cane - single point;BSC/3in1      Prior Function Prior Level of Function : Independent/Modified Independent             Mobility Comments: ambulates in community no AD ADLs Comments: Very independent, likes to garden, shop, and raising her 5 yo great grandson     Higher education careers adviser        Extremity/Trunk Assessment   Upper Extremity Assessment Upper Extremity Assessment: Overall WFL for tasks assessed    Lower Extremity Assessment Lower Extremity Assessment: LLE deficits/detail LLE Deficits / Details: ROM: knee 5 to 90 degrees, other WFL; MMT: ankle 5/5, hip and knee 3/5 not further tested    Cervical / Trunk Assessment Cervical / Trunk Assessment: Normal  Communication   Communication: No difficulties  Cognition Arousal/Alertness: Awake/alert Behavior During Therapy: WFL for tasks assessed/performed Overall Cognitive Status: Within Functional Limits for tasks assessed                                 General Comments: Very motivated        General Comments      Exercises     Assessment/Plan    PT Assessment Patient needs continued PT services  PT Problem List Decreased strength;Decreased mobility;Decreased safety awareness;Decreased range of motion;Decreased activity tolerance;Decreased balance;Decreased knowledge of use of DME;Pain       PT Treatment Interventions DME instruction;Therapeutic activities;Gait training;Therapeutic exercise;Patient/family education;Stair training;Balance training;Functional mobility training;Modalities    PT Goals (Current goals can be found in the Care Plan section)  Acute Rehab PT Goals Patient Stated Goal: return home PT Goal Formulation: With patient/family Time For Goal Achievement: 12/21/21 Potential to Achieve Goals: Good    Frequency 7X/week     Co-evaluation                AM-PAC PT "6 Clicks" Mobility  Outcome Measure Help needed turning from your back to your side while in a flat bed without using bedrails?: A Little Help needed moving from lying on your back to sitting on the side of a flat bed without using bedrails?: A Little Help needed moving to and from a bed to a chair (including a wheelchair)?: A Little Help needed standing up from a chair using your arms (e.g., wheelchair or bedside chair)?: A Little Help needed to walk in hospital room?: A Little Help needed climbing 3-5 steps with a railing? : A Little 6 Click Score: 18    End of Session Equipment Utilized During Treatment: Gait belt Activity Tolerance: Patient tolerated treatment well Patient left: in chair;with call bell/phone within reach;with family/visitor present (on 3C , no alarms, follows commands-aware to call for help) Nurse Communication: Mobility status PT Visit Diagnosis: Other abnormalities of gait and mobility (R26.89);Muscle weakness (generalized) (M62.81);Pain Pain - Right/Left: Left Pain - part of body: Knee    Time: 1715-1736 PT Time Calculation (min) (ACUTE ONLY): 21 min   Charges:   PT Evaluation $PT Eval Low Complexity: 1 Low          Saraphina Lauderbaugh, PT  Acute Rehab [facility] [phone]   Rayetta Humphrey 12/07/2021, 5:49 PM

## 2021-12-07 NOTE — Progress Notes (Signed)
Will space out post op vancomycin x1 based on her current renal function.  Ulyses Southward, PharmD, BCIDP, AAHIVP, CPP Infectious Disease Pharmacist 12/07/2021 5:04 PM

## 2021-12-07 NOTE — Anesthesia Preprocedure Evaluation (Signed)
Anesthesia Evaluation  Patient identified by MRN, date of birth, ID band Patient awake    Reviewed: Allergy & Precautions, NPO status , Patient's Chart, lab work & pertinent test results  History of Anesthesia Complications Negative for: history of anesthetic complications  Airway Mallampati: III  TM Distance: >3 FB Neck ROM: Full    Dental  (+) Missing, Dental Advisory Given,    Pulmonary neg pulmonary ROS,    breath sounds clear to auscultation       Cardiovascular hypertension, Pt. on medications (-) angina(-) Past MI, (-) Cardiac Stents and (-) CABG  Rhythm:Regular  - Left ventricle: The cavity size was normal. Systolic function was   normal. The estimated ejection fraction was in the range of 55% to   60%. Wall motion was normal; there were no regional wall motion   abnormalities. Left ventricular diastolic function parameters were   normal.  - Aortic valve: Mild regurgitation.     Neuro/Psych  Headaches, neg Seizures PSYCHIATRIC DISORDERS Anxiety    GI/Hepatic Neg liver ROS, GERD  Medicated and Controlled,  Endo/Other  diabetes, Type 2  Renal/GU Lab Results      Component                Value               Date                      CREATININE               1.00                11/25/2021                Musculoskeletal  (+) Arthritis ,   Abdominal   Peds  Hematology Lab Results      Component                Value               Date                      WBC                      6.3                 11/30/2021                HGB                      13.6                11/30/2021                HCT                      39.6                11/30/2021                MCV                      80.0                11/30/2021                PLT  322                 11/30/2021           No results found for: "INR", "PROTIME" Denies blood thinners   Anesthesia Other Findings    Reproductive/Obstetrics                             Anesthesia Physical Anesthesia Plan  ASA: 2  Anesthesia Plan: MAC, Regional and Spinal   Post-op Pain Management: Ofirmev IV (intra-op)*   Induction:   PONV Risk Score and Plan: 2 and Propofol infusion and Treatment may vary due to age or medical condition  Airway Management Planned: Nasal Cannula and Natural Airway  Additional Equipment: None  Intra-op Plan:   Post-operative Plan:   Informed Consent: I have reviewed the patients History and Physical, chart, labs and discussed the procedure including the risks, benefits and alternatives for the proposed anesthesia with the patient or authorized representative who has indicated his/her understanding and acceptance.     Dental advisory given  Plan Discussed with: CRNA  Anesthesia Plan Comments:         Anesthesia Quick Evaluation

## 2021-12-07 NOTE — Discharge Instructions (Signed)

## 2021-12-08 ENCOUNTER — Other Ambulatory Visit: Payer: Self-pay | Admitting: Orthopaedic Surgery

## 2021-12-08 ENCOUNTER — Telehealth: Payer: Self-pay | Admitting: Radiology

## 2021-12-08 DIAGNOSIS — E119 Type 2 diabetes mellitus without complications: Secondary | ICD-10-CM | POA: Diagnosis not present

## 2021-12-08 DIAGNOSIS — Z96651 Presence of right artificial knee joint: Secondary | ICD-10-CM | POA: Diagnosis not present

## 2021-12-08 DIAGNOSIS — I10 Essential (primary) hypertension: Secondary | ICD-10-CM | POA: Diagnosis not present

## 2021-12-08 DIAGNOSIS — M1712 Unilateral primary osteoarthritis, left knee: Secondary | ICD-10-CM | POA: Diagnosis not present

## 2021-12-08 LAB — GLUCOSE, CAPILLARY: Glucose-Capillary: 125 mg/dL — ABNORMAL HIGH (ref 70–99)

## 2021-12-08 LAB — BASIC METABOLIC PANEL
Anion gap: 8 (ref 5–15)
BUN: 17 mg/dL (ref 8–23)
CO2: 29 mmol/L (ref 22–32)
Calcium: 8.4 mg/dL — ABNORMAL LOW (ref 8.9–10.3)
Chloride: 103 mmol/L (ref 98–111)
Creatinine, Ser: 1.16 mg/dL — ABNORMAL HIGH (ref 0.44–1.00)
GFR, Estimated: 49 mL/min — ABNORMAL LOW (ref >60)
Glucose, Bld: 118 mg/dL — ABNORMAL HIGH (ref 70–99)
Potassium: 4.1 mmol/L (ref 3.5–5.1)
Sodium: 140 mmol/L (ref 135–145)

## 2021-12-08 LAB — CBC
HCT: 32.9 % — ABNORMAL LOW (ref 36.0–46.0)
Hemoglobin: 11.8 g/dL — ABNORMAL LOW (ref 12.0–15.0)
MCH: 28.1 pg (ref 26.0–34.0)
MCHC: 35.9 g/dL (ref 30.0–36.0)
MCV: 78.3 fL — ABNORMAL LOW (ref 80.0–100.0)
Platelets: 246 10*3/uL (ref 150–400)
RBC: 4.2 MIL/uL (ref 3.87–5.11)
RDW: 13.6 % (ref 11.5–15.5)
WBC: 10 10*3/uL (ref 4.0–10.5)
nRBC: 0 % (ref 0.0–0.2)

## 2021-12-08 MED ORDER — OXYCODONE HCL 5 MG PO TABS: 5.0000 mg | ORAL_TABLET | Freq: Four times a day (QID) | ORAL | 0 refills | Status: DC | PRN

## 2021-12-08 MED ORDER — METHOCARBAMOL 500 MG PO TABS: 500.0000 mg | ORAL_TABLET | Freq: Four times a day (QID) | ORAL | 1 refills | Status: DC | PRN

## 2021-12-08 MED ORDER — ASPIRIN 81 MG PO CHEW: 81.0000 mg | CHEWABLE_TABLET | Freq: Two times a day (BID) | ORAL | 0 refills | Status: DC

## 2021-12-08 NOTE — Care Plan (Signed)
Ortho Bundle Case Management Note  Patient Details  Name: Kathleen Greer MRN: [id number] Date of Birth: [birth year]  [facility] RNCM call to patient to discuss her Left total knee arthroplasty with Dr. Magnus Ivan on 12/07/21. She had a previous Right TKR done in November of 2022. She is an ortho bundle patient through [facility] and is agreeable to case management. She has a grandson that will be assisting her at home after surgery. She still has her RW and has no other DME needs. Anticipate HHPT will be needed after a short hospital stay. Referral made to [facility] after choice provided. Reviewed all post op care instructions. Will continue to follow for needs.                   DME Arranged:   (Patient has RW; no other DME needed) DME Agency:     HH Arranged:  PT HH Agency:  [facility]  Additional Comments: Please contact me with any questions of if this plan should need to change.  Ralph Dowdy, RN, BSN, [employer]  [phone] 12/08/2021, 8:30 AM

## 2021-12-08 NOTE — Telephone Encounter (Signed)
Answering service called triage line, Carla from [facility] is calling, she states that there is a Quantity issue that needs to be cleared up. Please call her back at [phone].

## 2021-12-08 NOTE — Progress Notes (Signed)
Physical Therapy Treatment Patient Details Name: Kathleen Greer MRN: [id number] DOB: 1945-08-05 Today's Date: 12/08/2021   History of Present Illness Pt is 76 yo female s/p L TKA on 12/07/21.  Pt with hx including R TKA 2022, HTN, DM, vertigo    PT Comments    Session focus seated therapeutic exercise, and stair training. Pt able to perform seated exercises. Simulated stairs into home with single step in room. Pt able to sequence steps with min guard. Educated pt on need for hourly movement to decrease pain and stiffness. Pt verbalizes understanding and says boyfriend will help her. D/c plans remain appropriate.    Recommendations for follow up therapy are one component of a multi-disciplinary discharge planning process, led by the attending physician.  Recommendations may be updated based on patient status, additional functional criteria and insurance authorization.  Follow Up Recommendations  Follow physician's recommendations for discharge plan and follow up therapies     Assistance Recommended at Discharge Intermittent Supervision/Assistance  Patient can return home with the following A little help with walking and/or transfers;A little help with bathing/dressing/bathroom;Assistance with cooking/housework;Help with stairs or ramp for entrance   Equipment Recommendations  None recommended by PT       Precautions / Restrictions Precautions Precautions: Fall Required Braces or Orthoses: Knee Immobilizer - Left Knee Immobilizer - Left: Discontinue once straight leg raise with < 10 degree lag Restrictions Weight Bearing Restrictions: Yes LLE Weight Bearing: Weight bearing as tolerated     Mobility  Bed Mobility Overal bed mobility: Modified Independent Bed Mobility: Supine to Sit     Supine to sit: Supervision     General bed mobility comments: pt sitting EoB on entry    Transfers Overall transfer level: Needs assistance Equipment used: Rolling walker (2  wheels) Transfers: Sit to/from Stand, Bed to chair/wheelchair/BSC Sit to Stand: Min guard           General transfer comment: min guard for safety, vc for hand placement for power up to facilitate initial pressure on R LE due to increased pain    Ambulation/Gait Ambulation/Gait assistance: Min guard Gait Distance (Feet): 10 Feet Assistive device: Rolling walker (2 wheels) Gait Pattern/deviations: Step-to pattern, Decreased stride length, Step-through pattern, Decreased step length - right, Decreased stance time - left, Knee flexed in stance - left, Trunk flexed Gait velocity: decreased Gait velocity interpretation: <1.8 ft/sec, indicate of risk for recurrent falls   General Gait Details: better ambulation sequencing   Stairs Stairs: Yes Stairs assistance: Min guard Stair Management: Two rails, Step to pattern, Forwards Number of Stairs: 1 (x4) General stair comments: utilized single step to simulate steps into home, vc for sequencing, pt with increased difficulty in pulling L LE up to tread due to decreased L knee flexion, educated on need for increased knee flexion for proper stair climb       Balance Overall balance assessment: Needs assistance Sitting-balance support: Feet supported Sitting balance-Leahy Scale: Good     Standing balance support: Reliant on assistive device for balance Standing balance-Leahy Scale: Fair Standing balance comment: RW to ambulate but could static stand                            Cognition Arousal/Alertness: Awake/alert Behavior During Therapy: WFL for tasks assessed/performed Overall Cognitive Status: Within Functional Limits for tasks assessed  Exercises Total Joint Exercises Ankle Circles/Pumps: AROM, Both, 10 reps, Supine Quad Sets: AAROM, Left, 10 reps Short Arc Quad: AAROM, 10 reps, Supine, Left Heel Slides: AAROM, Left, 10 reps Hip ABduction/ADduction:  AAROM, Left, 10 reps Straight Leg Raises: AAROM, Left, 10 reps Long Arc Quad: AAROM, Left, 10 reps, Seated Knee Flexion: AROM, AAROM, Left, 10 reps, Seated Goniometric ROM: 10-110    General Comments General comments (skin integrity, edema, etc.): VSS, increased time spent on relaxation and deep breathing to progress exercise and mobility      Pertinent Vitals/Pain Pain Assessment Pain Assessment: 0-10 Pain Score: 4  Pain Location: L knee Pain Descriptors / Indicators: Aching, Guarding, Grimacing Pain Intervention(s): Limited activity within patient's tolerance, Monitored during session, Repositioned     PT Goals (current goals can now be found in the care plan section) Acute Rehab PT Goals Patient Stated Goal: return home PT Goal Formulation: With patient/family Time For Goal Achievement: 12/21/21 Potential to Achieve Goals: Good Progress towards PT goals: Progressing toward goals    Frequency    7X/week      PT Plan Current plan remains appropriate       AM-PAC PT "6 Clicks" Mobility   Outcome Measure  Help needed turning from your back to your side while in a flat bed without using bedrails?: A Little Help needed moving from lying on your back to sitting on the side of a flat bed without using bedrails?: A Little Help needed moving to and from a bed to a chair (including a wheelchair)?: A Little Help needed standing up from a chair using your arms (e.g., wheelchair or bedside chair)?: A Little Help needed to walk in hospital room?: A Little Help needed climbing 3-5 steps with a railing? : A Little 6 Click Score: 18    End of Session Equipment Utilized During Treatment: Gait belt Activity Tolerance: Patient tolerated treatment well Patient left: in chair;with call bell/phone within reach;with family/visitor present (on 3C , no alarms, follows commands-aware to call for help) Nurse Communication: Mobility status PT Visit Diagnosis: Other abnormalities of gait  and mobility (R26.89);Muscle weakness (generalized) (M62.81);Pain Pain - Right/Left: Left Pain - part of body: Knee     Time: 1215-1233 PT Time Calculation (min) (ACUTE ONLY): 18 min  Charges:  $Gait Training: 8-22 mins $Therapeutic Exercise: 8-22 mins                     Kathleen Alldredge B. Beverely Risen PT, DPT Acute Rehabilitation Services Please use secure chat or  Call Office [phone]    Elon Alas [facility] 12/08/2021, 1:46 PM

## 2021-12-08 NOTE — Progress Notes (Signed)
Physical Therapy Treatment Patient Details Name: Kathleen Greer MRN: [id number] DOB: [birth year] Today's Date: 12/08/2021   History of Present Illness Pt is 76 yo female s/p L TKA on 12/07/21.  Pt with hx including R TKA 2022, HTN, DM, vertigo    PT Comments    Pt reporting 7/10 pain on entry despite being premedicated 45 min prior to treatment session. Educated and performed supine exercises with AAROM due to increased pain. Increased cuing for relaxation and use of LE muscles. Pt requires supervision for bed mobility and min guard for transfers and ambulation. Plan for second session to perform seated exercise and stair training. D/c plans remain appropriate.      Recommendations for follow up therapy are one component of a multi-disciplinary discharge planning process, led by the attending physician.  Recommendations may be updated based on patient status, additional functional criteria and insurance authorization.  Follow Up Recommendations  Follow physician's recommendations for discharge plan and follow up therapies     Assistance Recommended at Discharge Intermittent Supervision/Assistance  Patient can return home with the following A little help with walking and/or transfers;A little help with bathing/dressing/bathroom;Assistance with cooking/housework;Help with stairs or ramp for entrance   Equipment Recommendations  None recommended by PT    Recommendations for Other Services       Precautions / Restrictions Precautions Precautions: Fall Required Braces or Orthoses: Knee Immobilizer - Left Knee Immobilizer - Left: Discontinue once straight leg raise with < 10 degree lag Restrictions Weight Bearing Restrictions: Yes LLE Weight Bearing: Weight bearing as tolerated     Mobility  Bed Mobility Overal bed mobility: Modified Independent Bed Mobility: Supine to Sit     Supine to sit: Supervision     General bed mobility comments: requires increased cuing for sequencing  and not using UE to move L LE    Transfers Overall transfer level: Needs assistance Equipment used: Rolling walker (2 wheels) Transfers: Sit to/from Stand, Bed to chair/wheelchair/BSC Sit to Stand: Min guard           General transfer comment: min guard for safety, vc for hand placement for power up to facilitate initial pressure on R LE due to increased pain    Ambulation/Gait Ambulation/Gait assistance: Min guard Gait Distance (Feet): 60 Feet Assistive device: Rolling walker (2 wheels) Gait Pattern/deviations: Step-to pattern, Decreased stride length, Step-through pattern, Decreased step length - right, Decreased stance time - left, Knee flexed in stance - left, Trunk flexed Gait velocity: decreased Gait velocity interpretation: <1.8 ft/sec, indicate of risk for recurrent falls   General Gait Details: min guard for safety, cues for upright posture, looking up and out, progression from step to to step through pattern, gait progressively antalgic with distance. pt request to return to room       Balance Overall balance assessment: Needs assistance Sitting-balance support: Feet supported Sitting balance-Leahy Scale: Good     Standing balance support: Reliant on assistive device for balance Standing balance-Leahy Scale: Fair Standing balance comment: RW to ambulate but could static stand                            Cognition Arousal/Alertness: Awake/alert Behavior During Therapy: WFL for tasks assessed/performed Overall Cognitive Status: Within Functional Limits for tasks assessed  Exercises Total Joint Exercises Ankle Circles/Pumps: AROM, Both, 10 reps, Supine Quad Sets: AAROM, Left, 10 reps Short Arc Quad: AAROM, 10 reps, Supine, Left Heel Slides: AAROM, Left, 10 reps Hip ABduction/ADduction: AAROM, Left, 10 reps Straight Leg Raises: AAROM, Left, 10 reps    General Comments General comments  (skin integrity, edema, etc.): VSS, increased time spent on relaxation and deep breathing to progress exercise and mobility      Pertinent Vitals/Pain Pain Assessment Pain Assessment: 0-10 Pain Score: 7  Pain Location: L knee Pain Descriptors / Indicators: Aching, Guarding, Grimacing Pain Intervention(s): Limited activity within patient's tolerance, Monitored during session, Repositioned     PT Goals (current goals can now be found in the care plan section) Acute Rehab PT Goals Patient Stated Goal: return home PT Goal Formulation: With patient/family Time For Goal Achievement: 12/21/21 Potential to Achieve Goals: Good Progress towards PT goals: Progressing toward goals    Frequency    7X/week      PT Plan Current plan remains appropriate       AM-PAC PT "6 Clicks" Mobility   Outcome Measure  Help needed turning from your back to your side while in a flat bed without using bedrails?: A Little Help needed moving from lying on your back to sitting on the side of a flat bed without using bedrails?: A Little Help needed moving to and from a bed to a chair (including a wheelchair)?: A Little Help needed standing up from a chair using your arms (e.g., wheelchair or bedside chair)?: A Little Help needed to walk in hospital room?: A Little Help needed climbing 3-5 steps with a railing? : A Little 6 Click Score: 18    End of Session Equipment Utilized During Treatment: Gait belt Activity Tolerance: Patient tolerated treatment well Patient left: in chair;with call bell/phone within reach;with family/visitor present (on 3C , no alarms, follows commands-aware to call for help) Nurse Communication: Mobility status PT Visit Diagnosis: Other abnormalities of gait and mobility (R26.89);Muscle weakness (generalized) (M62.81);Pain Pain - Right/Left: Left Pain - part of body: Knee     Time: 1610-9604 PT Time Calculation (min) (ACUTE ONLY): 37 min  Charges:  $Gait Training: 8-22  mins $Therapeutic Exercise: 8-22 mins                     Yoshiaki Kreuser B. Beverely Risen PT, DPT Acute Rehabilitation Services Please use secure chat or  Call Office [phone]    Elon Alas [facility] 12/08/2021, 1:40 PM

## 2021-12-08 NOTE — Discharge Summary (Signed)
Patient ID: Kathleen Greer MRN: [id number] DOB/AGE: 1946-01-02 76 y.o.  Admit date: 12/07/2021 Discharge date: 12/08/2021  Admission Diagnoses:  Principal Problem:   Unilateral primary osteoarthritis, left knee Active Problems:   Status post total left knee replacement   Discharge Diagnoses:  Same  Past Medical History:  Diagnosis Date   Arthritis    Blood transfusion without reported diagnosis 04/18/1968   after hysterectomy   Dependent edema    Diabetes (HCC)    type 2   Generalized headaches    GERD (gastroesophageal reflux disease)    Hyperkalemia    Hypertension    IBS (irritable bowel syndrome)    Positional vertigo    Scoliosis     Surgeries: Procedure(s): LEFT TOTAL KNEE ARTHROPLASTY on 12/07/2021   Consultants:   Discharged Condition: Improved  Hospital Course: Kathleen Greer is an 76 y.o. female who was admitted 12/07/2021 for operative treatment ofUnilateral primary osteoarthritis, left knee. Patient has severe unremitting pain that affects sleep, daily activities, and work/hobbies. After pre-op clearance the patient was taken to the operating room on 12/07/2021 and underwent  Procedure(s): LEFT TOTAL KNEE ARTHROPLASTY.    Patient was given perioperative antibiotics:  Anti-infectives (From admission, onward)    Start     Dose/Rate Route Frequency Ordered Stop   12/08/21 0200  vancomycin (VANCOCIN) IVPB 1000 mg/200 mL premix        1,000 mg 200 mL/hr over 60 Minutes Intravenous  Once 12/07/21 1703 12/08/21 0157   12/07/21 1600  vancomycin (VANCOCIN) IVPB 1000 mg/200 mL premix  Status:  Discontinued        1,000 mg 200 mL/hr over 60 Minutes Intravenous Every 12 hours 12/07/21 1556 12/07/21 1702   12/07/21 1030  vancomycin (VANCOCIN) IVPB 1000 mg/200 mL premix        1,000 mg 200 mL/hr over 60 Minutes Intravenous On call to O.R. 12/07/21 1023 12/07/21 1214        Patient was given sequential compression devices, early ambulation, and chemoprophylaxis to  prevent DVT.  Patient benefited maximally from hospital stay and there were no complications.    Recent vital signs: Patient Vitals for the past 24 hrs:  BP Temp Temp src Pulse Resp SpO2  12/08/21 0807 129/65 99 F (37.2 C) Oral 74 16 96 %  12/08/21 0357 (!) 140/88 98.3 F (36.8 C) Oral 66 18 97 %  12/07/21 2256 (!) 140/77 98.1 F (36.7 C) Oral 67 18 95 %  12/07/21 1954 133/68 99.5 F (37.5 C) Oral 73 18 100 %  12/07/21 1619 135/75 -- -- (!) 58 18 99 %  12/07/21 1600 (!) 136/106 (!) 97.5 F (36.4 C) -- 61 13 100 %  12/07/21 1545 135/75 -- -- (!) 55 14 96 %  12/07/21 1530 131/72 -- -- (!) 54 16 95 %  12/07/21 1515 129/72 -- -- (!) 57 16 100 %  12/07/21 1500 128/67 -- -- (!) 58 18 97 %  12/07/21 1445 113/70 -- -- 60 (!) 23 98 %  12/07/21 1430 107/64 -- -- 65 17 95 %  12/07/21 1417 108/64 -- -- 76 19 94 %  12/07/21 1415 108/64 (!) 97.5 F (36.4 C) -- 69 19 92 %     Recent laboratory studies:  Recent Labs    12/08/21 0626  WBC 10.0  HGB 11.8*  HCT 32.9*  PLT 246  NA 140  K 4.1  CL 103  CO2 29  BUN 17  CREATININE 1.16*  GLUCOSE 118*  CALCIUM 8.4*  Discharge Medications:   Allergies as of 12/08/2021       Reactions   Penicillins    Skin irritation        Medication List     TAKE these medications    amLODipine 10 MG tablet Commonly known as: NORVASC TAKE 1 TABLET BY MOUTH  DAILY   aspirin 81 MG chewable tablet Chew 1 tablet (81 mg total) by mouth 2 (two) times daily. What changed: when to take this   fluticasone 50 MCG/ACT nasal spray Commonly known as: FLONASE Place 1 spray into both nostrils daily as needed for allergies or rhinitis.   glipiZIDE 5 MG tablet Commonly known as: GLUCOTROL TAKE 1 TABLET BY MOUTH  BEFORE BREAKFAST AND SUPPER   ICY HOT EX Apply 1 Application topically daily as needed (knee pain).   losartan 100 MG tablet Commonly known as: COZAAR TAKE 1 TABLET BY MOUTH  DAILY   metFORMIN 1000 MG tablet Commonly known as:  GLUCOPHAGE TAKE 1 TABLET BY MOUTH  DAILY WITH BREAKFAST   methocarbamol 500 MG tablet Commonly known as: ROBAXIN Take 1 tablet (500 mg total) by mouth every 6 (six) hours as needed for muscle spasms.   omeprazole 20 MG capsule Commonly known as: PRILOSEC TAKE 1 CAPSULE BY MOUTH  DAILY   ONE TOUCH ULTRA 2 w/Device Kit   ONE TOUCH ULTRA TEST test strip Generic drug: glucose blood   OneTouch Delica Lancets Fine Misc   oxyCODONE 5 MG immediate release tablet Commonly known as: Oxy IR/ROXICODONE Take 1-2 tablets (5-10 mg total) by mouth every 6 (six) hours as needed for moderate pain (pain score 4-6). No more than 6 tablets per day.   potassium chloride SA 20 MEQ tablet Commonly known as: KLOR-CON M TAKE 1 TABLET BY MOUTH  TWICE DAILY What changed: when to take this   rosuvastatin 5 MG tablet Commonly known as: CRESTOR TAKE 1 TABLET BY MOUTH  DAILY   sodium chloride 0.65 % Soln nasal spray Commonly known as: OCEAN Place 1 spray into both nostrils as needed for congestion.   torsemide 20 MG tablet Commonly known as: DEMADEX TAKE 1 OR 2 TABLETS BY  MOUTH DAILY What changed:  how much to take how to take this when to take this additional instructions   VISINE OP Place 1 drop into both eyes daily as needed (irritation).   Vitamin D 50 MCG (2000 UT) tablet Take 2,000 Units by mouth in the morning.               Durable Medical Equipment  (From admission, onward)           Start     Ordered   12/07/21 1618  DME 3 n 1  Once        12/07/21 1617   12/07/21 1618  DME Walker rolling  Once       Question Answer Comment  Walker: With 5 Inch Wheels   Patient needs a walker to treat with the following condition Status post total left knee replacement      12/07/21 1617            Diagnostic Studies: DG Knee Left Port  Result Date: 12/07/2021 CLINICAL DATA:  Left total knee replacement, postoperative imaging EXAM: PORTABLE LEFT KNEE - 1-2 VIEW  COMPARISON:  06/17/2020 FINDINGS: Left total knee prosthesis in place without periprosthetic fracture or complicating feature. Expected alignment of components noted. Expected gas in the soft tissues and overlying skin clips. IMPRESSION: 1.  Left total knee arthroplasty, no early complicating feature observed. Electronically Signed   By: Van Clines M.D.   On: 12/07/2021 16:25    Disposition: Discharge disposition: 01-Home or [place] [place] Follow up.   Specialty: [place] Why: The home health agency will contact you for the first home visit Contact information: [address] [place] [address] [phone]         Mcarthur Rossetti, MD. Go on 12/22/2021.   Specialty: Orthopedic Surgery Why: at 9:30 am for your first post op appointment with Dr. Ninfa Linden in his office Contact information: [place] [address] [phone]                  Signed: Mcarthur Rossetti 12/08/2021, 11:44 AM

## 2021-12-08 NOTE — Progress Notes (Signed)
Subjective: 1 Day Post-Op Procedure(s) (LRB): LEFT TOTAL KNEE ARTHROPLASTY (Left) Patient reports pain as moderate.    Objective: Vital signs in last 24 hours: Temp:  [97.5 F (36.4 C)-99.5 F (37.5 C)] 98.3 F (36.8 C) (08/23 0357) Pulse Rate:  [54-76] 66 (08/23 0357) Resp:  [13-23] 18 (08/23 0357) BP: (107-142)/(64-106) 140/88 (08/23 0357) SpO2:  [92 %-100 %] 97 % (08/23 0357) Weight:  [76.2 kg] 76.2 kg (08/22 1010)  Intake/Output from previous day: 08/22 0701 - 08/23 0700 In: 1400 [I.V.:1200; IV Piggyback:200] Out: 1895 [Urine:1875; Blood:20] Intake/Output this shift: No intake/output data recorded.  No results for input(s): "HGB" in the last 72 hours. No results for input(s): "WBC", "RBC", "HCT", "PLT" in the last 72 hours. No results for input(s): "NA", "K", "CL", "CO2", "BUN", "CREATININE", "GLUCOSE", "CALCIUM" in the last 72 hours. No results for input(s): "LABPT", "INR" in the last 72 hours.  Sensation intact distally Intact pulses distally Dorsiflexion/Plantar flexion intact Incision: dressing C/D/I and scant drainage Compartment soft   Assessment/Plan: 1 Day Post-Op Procedure(s) (LRB): LEFT TOTAL KNEE ARTHROPLASTY (Left) Up with therapy Discharge home with home health this afternoon if doing well.      Kathryne Hitch 12/08/2021, 7:48 AM

## 2021-12-08 NOTE — Evaluation (Signed)
Occupational Therapy Evaluation Patient Details Name: Kathleen Greer MRN: [id number] DOB: 1945/11/13 Today's Date: 12/08/2021   History of Present Illness Pt is 76 yo female s/p L TKA on 12/07/21.  Pt with hx including R TKA 2022, HTN, DM, vertigo   Clinical Impression   Patient admitted for the procedure above.  PTA she lives at home alone.  She will have assist as needed for a few days from family, and should progress quickly back to baseline.  OT demonstrated hip kit for enhanced ADL performance, and discussed use of shower chair.  The patient can determine if she needs any addition adaptive equipment.  The patient is needing very minimal assist for lower body ADL, and is waling in her room close to Mod I with RW.  No further OT needs in the acute setting, and no post acute OT needs anticipated.         Recommendations for follow up therapy are one component of a multi-disciplinary discharge planning process, led by the attending physician.  Recommendations may be updated based on patient status, additional functional criteria and insurance authorization.   Follow Up Recommendations  No OT follow up    Assistance Recommended at Discharge Intermittent Supervision/Assistance  Patient can return home with the following Assist for transportation;Assistance with cooking/housework;A little help with bathing/dressing/bathroom    Functional Status Assessment  Patient has had a recent decline in their functional status and demonstrates the ability to make significant improvements in function in a reasonable and predictable amount of time.  Equipment Recommendations  None recommended by OT    Recommendations for Other Services       Precautions / Restrictions Precautions Precautions: Fall Restrictions LLE Weight Bearing: Weight bearing as tolerated      Mobility Bed Mobility Overal bed mobility: Modified Independent                  Transfers Overall transfer level: Needs  assistance Equipment used: Rolling walker (2 wheels) Transfers: Sit to/from Stand, Bed to chair/wheelchair/BSC Sit to Stand: Supervision                  Balance Overall balance assessment: Needs assistance Sitting-balance support: Feet supported Sitting balance-Leahy Scale: Good     Standing balance support: Reliant on assistive device for balance Standing balance-Leahy Scale: Fair                             ADL either performed or assessed with clinical judgement   ADL                       Lower Body Dressing: Min guard;Sit to/from stand;With adaptive equipment   Toilet Transfer: Modified Independent;Rolling walker (2 wheels)                   Vision Patient Visual Report: No change from baseline       Perception Perception Perception: Not tested   Praxis Praxis Praxis: Not tested    Pertinent Vitals/Pain Pain Assessment Pain Score: 7  Pain Location: L knee Pain Descriptors / Indicators: Aching, Guarding, Grimacing Pain Intervention(s): Monitored during session     Hand Dominance Right   Extremity/Trunk Assessment Upper Extremity Assessment Upper Extremity Assessment: Overall WFL for tasks assessed   Lower Extremity Assessment Lower Extremity Assessment: Defer to PT evaluation       Communication Communication Communication: No difficulties   Cognition Arousal/Alertness: Awake/alert Behavior During  Therapy: WFL for tasks assessed/performed Overall Cognitive Status: Within Functional Limits for tasks assessed                                       General Comments   VSS    Exercises     Shoulder Instructions      Home Living Family/patient expects to be discharged to:: Private residence Living Arrangements: Other relatives Available Help at Discharge: Family;Available 24 hours/day Type of Home: House Home Access: Stairs to enter [employer] of Steps: 3 Entrance Stairs-Rails:  None Home Layout: One level     Bathroom Shower/Tub: Occupational psychologist: Standard     Home Equipment: Conservation officer, nature (2 wheels);Cane - single point;BSC/3in1          Prior Functioning/Environment Prior Level of Function : Independent/Modified Independent                        OT Problem List: Pain;Decreased range of motion      OT Treatment/Interventions:      OT Goals(Current goals can be found in the care plan section) Acute Rehab OT Goals Patient Stated Goal: Hoping to go home today OT Goal Formulation: With patient Time For Goal Achievement: 12/13/21 Potential to Achieve Goals: Good  OT Frequency:      Co-evaluation              AM-PAC OT "6 Clicks" Daily Activity     Outcome Measure Help from another person eating meals?: None Help from another person taking care of personal grooming?: None Help from another person toileting, which includes using toliet, bedpan, or urinal?: A Little Help from another person bathing (including washing, rinsing, drying)?: A Little Help from another person to put on and taking off regular upper body clothing?: None Help from another person to put on and taking off regular lower body clothing?: A Little 6 Click Score: 21   End of Session Equipment Utilized During Treatment: Rolling walker (2 wheels) Nurse Communication: Mobility status  Activity Tolerance: Patient tolerated treatment well Patient left: in bed;with call bell/phone within reach  OT Visit Diagnosis: Unsteadiness on feet (R26.81);Pain Pain - Right/Left: Left Pain - part of body: Knee                Time: 0821-0839 OT Time Calculation (min): 18 min Charges:  OT General Charges $OT Visit: 1 Visit OT Evaluation $OT Eval Moderate Complexity: 1 Mod  12/08/2021  RP, OTR/L  Acute Rehabilitation Services  Office:  [phone]   Metta Clines 12/08/2021, 8:45 AM

## 2021-12-09 ENCOUNTER — Ambulatory Visit: Payer: Medicare Other | Admitting: Internal Medicine

## 2021-12-09 ENCOUNTER — Encounter (HOSPITAL_COMMUNITY): Payer: Self-pay | Admitting: Orthopaedic Surgery

## 2021-12-09 DIAGNOSIS — R7302 Impaired glucose tolerance (oral): Secondary | ICD-10-CM | POA: Diagnosis not present

## 2021-12-09 DIAGNOSIS — I10 Essential (primary) hypertension: Secondary | ICD-10-CM | POA: Diagnosis not present

## 2021-12-09 DIAGNOSIS — Z7982 Long term (current) use of aspirin: Secondary | ICD-10-CM | POA: Diagnosis not present

## 2021-12-09 DIAGNOSIS — Z471 Aftercare following joint replacement surgery: Secondary | ICD-10-CM | POA: Diagnosis not present

## 2021-12-09 DIAGNOSIS — K589 Irritable bowel syndrome without diarrhea: Secondary | ICD-10-CM | POA: Diagnosis not present

## 2021-12-09 DIAGNOSIS — H8110 Benign paroxysmal vertigo, unspecified ear: Secondary | ICD-10-CM | POA: Diagnosis not present

## 2021-12-09 DIAGNOSIS — K219 Gastro-esophageal reflux disease without esophagitis: Secondary | ICD-10-CM | POA: Diagnosis not present

## 2021-12-09 DIAGNOSIS — Z96653 Presence of artificial knee joint, bilateral: Secondary | ICD-10-CM | POA: Diagnosis not present

## 2021-12-09 DIAGNOSIS — Z7984 Long term (current) use of oral hypoglycemic drugs: Secondary | ICD-10-CM | POA: Diagnosis not present

## 2021-12-09 DIAGNOSIS — Z86010 Personal history of colonic polyps: Secondary | ICD-10-CM | POA: Diagnosis not present

## 2021-12-09 DIAGNOSIS — E119 Type 2 diabetes mellitus without complications: Secondary | ICD-10-CM | POA: Diagnosis not present

## 2021-12-09 DIAGNOSIS — M419 Scoliosis, unspecified: Secondary | ICD-10-CM | POA: Diagnosis not present

## 2021-12-09 MED ORDER — BUPIVACAINE-EPINEPHRINE (PF) 0.5% -1:200000 IJ SOLN
INTRAMUSCULAR | Status: DC | PRN
  Administered 2021-12-07: 20 mL via PERINEURAL

## 2021-12-09 MED ORDER — BUPIVACAINE IN DEXTROSE 0.75-8.25 % IT SOLN
INTRATHECAL | Status: DC | PRN
  Administered 2021-12-07: 1.6 mL via INTRATHECAL

## 2021-12-09 NOTE — Anesthesia Procedure Notes (Signed)
Spinal  Patient location during procedure: OR Start time: 12/07/2021 12:23 PM End time: 12/07/2021 12:27 PM Reason for block: surgical anesthesia Staffing Performed: anesthesiologist  Anesthesiologist: Val Eagle, MD Performed by: Val Eagle, MD Authorized by: Val Eagle, MD   Preanesthetic Checklist Completed: patient identified, IV checked, risks and benefits discussed, surgical consent, monitors and equipment checked, pre-op evaluation and timeout performed Spinal Block Patient position: sitting Prep: DuraPrep Patient monitoring: heart rate, cardiac monitor, continuous pulse ox and blood pressure Approach: midline Location: L3-4 Injection technique: single-shot Needle Needle type: Pencan  Needle gauge: 24 G Needle length: 9 cm Assessment Sensory level: T6 Events: CSF return

## 2021-12-09 NOTE — Anesthesia Postprocedure Evaluation (Signed)
Anesthesia Post Note  Patient: Kathleen Greer  Procedure(s) Performed: LEFT TOTAL KNEE ARTHROPLASTY (Left: Knee)     Patient location during evaluation: PACU Anesthesia Type: Regional, MAC and Spinal Level of consciousness: awake and alert Pain management: pain level controlled Vital Signs Assessment: post-procedure vital signs reviewed and stable Respiratory status: spontaneous breathing, nonlabored ventilation, respiratory function stable and patient connected to nasal cannula oxygen Cardiovascular status: stable and blood pressure returned to baseline Postop Assessment: no apparent nausea or vomiting Anesthetic complications: no   No notable events documented.  Last Vitals:  Vitals:   12/08/21 0357 12/08/21 0807  BP: (!) 140/88 129/65  Pulse: 66 74  Resp: 18 16  Temp: 36.8 C 37.2 C  SpO2: 97% 96%    Last Pain:  Vitals:   12/08/21 1017  TempSrc:   PainSc: 4                  Zina Pitzer

## 2021-12-09 NOTE — Anesthesia Procedure Notes (Signed)
Anesthesia Regional Block: Adductor canal block   Pre-Anesthetic Checklist: , timeout performed,  Correct Patient, Correct Site, Correct Laterality,  Correct Procedure, Correct Position, site marked,  Risks and benefits discussed,  Surgical consent,  Pre-op evaluation,  At surgeon's request and post-op pain management  Laterality: Left and Lower  Prep: chloraprep       Needles:  Injection technique: Single-shot      Needle Length: 9cm  Needle Gauge: 22     Additional Needles: Arrow StimuQuik ECHO Echogenic Stimulating PNB Needle  Procedures:,,,, ultrasound used (permanent image in chart),,    Narrative:  Start time: 12/07/2021 12:01 PM End time: 12/07/2021 12:05 PM Injection made incrementally with aspirations every 5 mL.  Performed by: Personally  Anesthesiologist: Val Eagle, MD

## 2021-12-10 ENCOUNTER — Telehealth: Payer: Self-pay | Admitting: *Deleted

## 2021-12-10 NOTE — Telephone Encounter (Signed)
Ortho bundle D/C call completed. 

## 2021-12-11 DIAGNOSIS — Z86010 Personal history of colonic polyps: Secondary | ICD-10-CM | POA: Diagnosis not present

## 2021-12-11 DIAGNOSIS — Z96653 Presence of artificial knee joint, bilateral: Secondary | ICD-10-CM | POA: Diagnosis not present

## 2021-12-11 DIAGNOSIS — K589 Irritable bowel syndrome without diarrhea: Secondary | ICD-10-CM | POA: Diagnosis not present

## 2021-12-11 DIAGNOSIS — R7302 Impaired glucose tolerance (oral): Secondary | ICD-10-CM | POA: Diagnosis not present

## 2021-12-11 DIAGNOSIS — Z7982 Long term (current) use of aspirin: Secondary | ICD-10-CM | POA: Diagnosis not present

## 2021-12-11 DIAGNOSIS — E119 Type 2 diabetes mellitus without complications: Secondary | ICD-10-CM | POA: Diagnosis not present

## 2021-12-11 DIAGNOSIS — Z471 Aftercare following joint replacement surgery: Secondary | ICD-10-CM | POA: Diagnosis not present

## 2021-12-11 DIAGNOSIS — M419 Scoliosis, unspecified: Secondary | ICD-10-CM | POA: Diagnosis not present

## 2021-12-11 DIAGNOSIS — K219 Gastro-esophageal reflux disease without esophagitis: Secondary | ICD-10-CM | POA: Diagnosis not present

## 2021-12-11 DIAGNOSIS — I10 Essential (primary) hypertension: Secondary | ICD-10-CM | POA: Diagnosis not present

## 2021-12-11 DIAGNOSIS — H8110 Benign paroxysmal vertigo, unspecified ear: Secondary | ICD-10-CM | POA: Diagnosis not present

## 2021-12-11 DIAGNOSIS — Z7984 Long term (current) use of oral hypoglycemic drugs: Secondary | ICD-10-CM | POA: Diagnosis not present

## 2021-12-13 ENCOUNTER — Other Ambulatory Visit: Payer: Self-pay | Admitting: Internal Medicine

## 2021-12-13 DIAGNOSIS — Z7984 Long term (current) use of oral hypoglycemic drugs: Secondary | ICD-10-CM | POA: Diagnosis not present

## 2021-12-13 DIAGNOSIS — K589 Irritable bowel syndrome without diarrhea: Secondary | ICD-10-CM | POA: Diagnosis not present

## 2021-12-13 DIAGNOSIS — I10 Essential (primary) hypertension: Secondary | ICD-10-CM | POA: Diagnosis not present

## 2021-12-13 DIAGNOSIS — Z96653 Presence of artificial knee joint, bilateral: Secondary | ICD-10-CM | POA: Diagnosis not present

## 2021-12-13 DIAGNOSIS — Z471 Aftercare following joint replacement surgery: Secondary | ICD-10-CM | POA: Diagnosis not present

## 2021-12-13 DIAGNOSIS — E119 Type 2 diabetes mellitus without complications: Secondary | ICD-10-CM | POA: Diagnosis not present

## 2021-12-13 DIAGNOSIS — Z86010 Personal history of colonic polyps: Secondary | ICD-10-CM | POA: Diagnosis not present

## 2021-12-13 DIAGNOSIS — R7302 Impaired glucose tolerance (oral): Secondary | ICD-10-CM | POA: Diagnosis not present

## 2021-12-13 DIAGNOSIS — Z7982 Long term (current) use of aspirin: Secondary | ICD-10-CM | POA: Diagnosis not present

## 2021-12-13 DIAGNOSIS — K219 Gastro-esophageal reflux disease without esophagitis: Secondary | ICD-10-CM | POA: Diagnosis not present

## 2021-12-13 DIAGNOSIS — H8110 Benign paroxysmal vertigo, unspecified ear: Secondary | ICD-10-CM | POA: Diagnosis not present

## 2021-12-13 DIAGNOSIS — M419 Scoliosis, unspecified: Secondary | ICD-10-CM | POA: Diagnosis not present

## 2021-12-15 ENCOUNTER — Telehealth: Payer: Self-pay | Admitting: *Deleted

## 2021-12-15 ENCOUNTER — Other Ambulatory Visit: Payer: Self-pay | Admitting: Orthopaedic Surgery

## 2021-12-15 DIAGNOSIS — R7302 Impaired glucose tolerance (oral): Secondary | ICD-10-CM | POA: Diagnosis not present

## 2021-12-15 DIAGNOSIS — I10 Essential (primary) hypertension: Secondary | ICD-10-CM | POA: Diagnosis not present

## 2021-12-15 DIAGNOSIS — Z86010 Personal history of colonic polyps: Secondary | ICD-10-CM | POA: Diagnosis not present

## 2021-12-15 DIAGNOSIS — Z471 Aftercare following joint replacement surgery: Secondary | ICD-10-CM | POA: Diagnosis not present

## 2021-12-15 DIAGNOSIS — Z7984 Long term (current) use of oral hypoglycemic drugs: Secondary | ICD-10-CM | POA: Diagnosis not present

## 2021-12-15 DIAGNOSIS — K589 Irritable bowel syndrome without diarrhea: Secondary | ICD-10-CM | POA: Diagnosis not present

## 2021-12-15 DIAGNOSIS — Z96653 Presence of artificial knee joint, bilateral: Secondary | ICD-10-CM | POA: Diagnosis not present

## 2021-12-15 DIAGNOSIS — Z7982 Long term (current) use of aspirin: Secondary | ICD-10-CM | POA: Diagnosis not present

## 2021-12-15 DIAGNOSIS — K219 Gastro-esophageal reflux disease without esophagitis: Secondary | ICD-10-CM | POA: Diagnosis not present

## 2021-12-15 DIAGNOSIS — H8110 Benign paroxysmal vertigo, unspecified ear: Secondary | ICD-10-CM | POA: Diagnosis not present

## 2021-12-15 DIAGNOSIS — E119 Type 2 diabetes mellitus without complications: Secondary | ICD-10-CM | POA: Diagnosis not present

## 2021-12-15 DIAGNOSIS — M419 Scoliosis, unspecified: Secondary | ICD-10-CM | POA: Diagnosis not present

## 2021-12-15 MED ORDER — OXYCODONE HCL 5 MG PO TABS: 5.0000 mg | ORAL_TABLET | Freq: Four times a day (QID) | ORAL | 0 refills | Status: DC | PRN

## 2021-12-15 NOTE — Telephone Encounter (Signed)
Patient made aware of med refill sent.

## 2021-12-15 NOTE — Telephone Encounter (Signed)
OPPT scheduled for Thursday, 12/23/21 at 11:00 am with [facility] location. Patient aware. All orders faxed.

## 2021-12-15 NOTE — Telephone Encounter (Signed)
Call to patient to check status; states she is having moderate pain and feels that therapy is going ok, but having a bad day today. Has not yet had a bowel movement. Recommended OTC magnesium citrate and explained how to use. She said she is low on pain medication- refill request sent to MD. Pharmacy she requested is [facility]. Thanks.

## 2021-12-17 DIAGNOSIS — M419 Scoliosis, unspecified: Secondary | ICD-10-CM | POA: Diagnosis not present

## 2021-12-17 DIAGNOSIS — R7302 Impaired glucose tolerance (oral): Secondary | ICD-10-CM | POA: Diagnosis not present

## 2021-12-17 DIAGNOSIS — I10 Essential (primary) hypertension: Secondary | ICD-10-CM | POA: Diagnosis not present

## 2021-12-17 DIAGNOSIS — Z471 Aftercare following joint replacement surgery: Secondary | ICD-10-CM | POA: Diagnosis not present

## 2021-12-17 DIAGNOSIS — Z7982 Long term (current) use of aspirin: Secondary | ICD-10-CM | POA: Diagnosis not present

## 2021-12-17 DIAGNOSIS — H8110 Benign paroxysmal vertigo, unspecified ear: Secondary | ICD-10-CM | POA: Diagnosis not present

## 2021-12-17 DIAGNOSIS — K589 Irritable bowel syndrome without diarrhea: Secondary | ICD-10-CM | POA: Diagnosis not present

## 2021-12-17 DIAGNOSIS — Z96653 Presence of artificial knee joint, bilateral: Secondary | ICD-10-CM | POA: Diagnosis not present

## 2021-12-17 DIAGNOSIS — E119 Type 2 diabetes mellitus without complications: Secondary | ICD-10-CM | POA: Diagnosis not present

## 2021-12-17 DIAGNOSIS — Z86010 Personal history of colonic polyps: Secondary | ICD-10-CM | POA: Diagnosis not present

## 2021-12-17 DIAGNOSIS — K219 Gastro-esophageal reflux disease without esophagitis: Secondary | ICD-10-CM | POA: Diagnosis not present

## 2021-12-17 DIAGNOSIS — Z7984 Long term (current) use of oral hypoglycemic drugs: Secondary | ICD-10-CM | POA: Diagnosis not present

## 2021-12-21 DIAGNOSIS — I10 Essential (primary) hypertension: Secondary | ICD-10-CM | POA: Diagnosis not present

## 2021-12-21 DIAGNOSIS — M419 Scoliosis, unspecified: Secondary | ICD-10-CM | POA: Diagnosis not present

## 2021-12-21 DIAGNOSIS — K219 Gastro-esophageal reflux disease without esophagitis: Secondary | ICD-10-CM | POA: Diagnosis not present

## 2021-12-21 DIAGNOSIS — H8110 Benign paroxysmal vertigo, unspecified ear: Secondary | ICD-10-CM | POA: Diagnosis not present

## 2021-12-21 DIAGNOSIS — K589 Irritable bowel syndrome without diarrhea: Secondary | ICD-10-CM | POA: Diagnosis not present

## 2021-12-21 DIAGNOSIS — Z7982 Long term (current) use of aspirin: Secondary | ICD-10-CM | POA: Diagnosis not present

## 2021-12-21 DIAGNOSIS — Z471 Aftercare following joint replacement surgery: Secondary | ICD-10-CM | POA: Diagnosis not present

## 2021-12-21 DIAGNOSIS — Z7984 Long term (current) use of oral hypoglycemic drugs: Secondary | ICD-10-CM | POA: Diagnosis not present

## 2021-12-21 DIAGNOSIS — R7302 Impaired glucose tolerance (oral): Secondary | ICD-10-CM | POA: Diagnosis not present

## 2021-12-21 DIAGNOSIS — Z86010 Personal history of colonic polyps: Secondary | ICD-10-CM | POA: Diagnosis not present

## 2021-12-21 DIAGNOSIS — Z96653 Presence of artificial knee joint, bilateral: Secondary | ICD-10-CM | POA: Diagnosis not present

## 2021-12-21 DIAGNOSIS — E119 Type 2 diabetes mellitus without complications: Secondary | ICD-10-CM | POA: Diagnosis not present

## 2021-12-22 ENCOUNTER — Telehealth: Payer: Self-pay | Admitting: Orthopaedic Surgery

## 2021-12-22 ENCOUNTER — Ambulatory Visit (INDEPENDENT_AMBULATORY_CARE_PROVIDER_SITE_OTHER): Payer: Medicare Other | Admitting: Orthopaedic Surgery

## 2021-12-22 ENCOUNTER — Telehealth: Payer: Self-pay | Admitting: *Deleted

## 2021-12-22 DIAGNOSIS — Z96652 Presence of left artificial knee joint: Secondary | ICD-10-CM

## 2021-12-22 MED ORDER — METHOCARBAMOL 500 MG PO TABS: 500.0000 mg | ORAL_TABLET | Freq: Four times a day (QID) | ORAL | 1 refills | Status: DC | PRN

## 2021-12-22 MED ORDER — OXYCODONE HCL 5 MG PO TABS: 5.0000 mg | ORAL_TABLET | Freq: Four times a day (QID) | ORAL | 0 refills | Status: DC | PRN

## 2021-12-22 NOTE — Telephone Encounter (Signed)
Patient came in today requesting a handicap placard please advise

## 2021-12-22 NOTE — Telephone Encounter (Signed)
Ortho bundle 14 day in office meeting completed. °

## 2021-12-22 NOTE — Progress Notes (Signed)
The patient is here for her first postop visit status post a left total knee arthroplasty.  We replaced her right knee successfully in November of last year.  She is doing well.  She lacks full extension by about 3 degrees but I can only flex her to maybe 75 degrees with that left knee.  The incision looks good so the staples have been removed and Steri-Strips applied.  Her leg is swollen but her calf is soft.  She is able to flex and extend her foot and ankle easily.  She transitions to outpatient physical therapy tomorrow.  We will see her back in 4 weeks to make sure that she is progressing in terms of motion of that left knee but no x-rays are needed.  I will refill her pain medication and muscle relaxant as well.  All questions and concerns were answered and addressed.

## 2021-12-22 NOTE — Telephone Encounter (Signed)
Patient aware of the below message  

## 2021-12-22 NOTE — Telephone Encounter (Signed)
Ok for handicap placard? For how long? 

## 2021-12-22 NOTE — Telephone Encounter (Signed)
Pt called requesting a call back from [facility]. Pt states Dr Magnus Ivan told her she need compression socks but did not tell her what level. Please call pt at [phone].

## 2021-12-23 DIAGNOSIS — M6281 Muscle weakness (generalized): Secondary | ICD-10-CM | POA: Diagnosis not present

## 2021-12-23 DIAGNOSIS — M25562 Pain in left knee: Secondary | ICD-10-CM | POA: Diagnosis not present

## 2021-12-27 DIAGNOSIS — M25562 Pain in left knee: Secondary | ICD-10-CM | POA: Diagnosis not present

## 2021-12-27 DIAGNOSIS — M6281 Muscle weakness (generalized): Secondary | ICD-10-CM | POA: Diagnosis not present

## 2021-12-29 DIAGNOSIS — M25562 Pain in left knee: Secondary | ICD-10-CM | POA: Diagnosis not present

## 2021-12-29 DIAGNOSIS — M6281 Muscle weakness (generalized): Secondary | ICD-10-CM | POA: Diagnosis not present

## 2022-01-03 DIAGNOSIS — M25562 Pain in left knee: Secondary | ICD-10-CM | POA: Diagnosis not present

## 2022-01-03 DIAGNOSIS — M6281 Muscle weakness (generalized): Secondary | ICD-10-CM | POA: Diagnosis not present

## 2022-01-05 ENCOUNTER — Telehealth: Payer: Self-pay | Admitting: Orthopaedic Surgery

## 2022-01-06 ENCOUNTER — Telehealth: Payer: Self-pay | Admitting: Orthopaedic Surgery

## 2022-01-06 ENCOUNTER — Other Ambulatory Visit: Payer: Self-pay | Admitting: Physician Assistant

## 2022-01-06 ENCOUNTER — Encounter: Payer: Self-pay | Admitting: *Deleted

## 2022-01-06 MED ORDER — OXYCODONE HCL 5 MG PO TABS: 5.0000 mg | ORAL_TABLET | Freq: Four times a day (QID) | ORAL | 0 refills | Status: DC | PRN

## 2022-01-06 NOTE — Telephone Encounter (Signed)
Please advise 

## 2022-01-06 NOTE — Telephone Encounter (Signed)
Pt called requesting a refill of oxycodone and muscle relaxer. Please send to [facility]. Pt phone number is [phone].

## 2022-01-06 NOTE — Progress Notes (Signed)
[facility] Quality Team Note  Name: Kathleen Greer Date of Birth: Aug 17, 1945 MRN: [id number] Date: 01/06/2022  [facility] Quality Team has reviewed this patient's chart, please see recommendations below:  [facility] Quality Other; Patient needs Urine Albumin Creatinine Ratio Test completed for gap closure. EGFR has already been completed,  Called pt to offer kit to be sent to her.  She had recent knee surgery so she declined.  Pt would need completed before end of 2023 to close gap. If you see pt before next year and it is ordered and completed before 2024, the gap could be closed.

## 2022-01-07 ENCOUNTER — Telehealth: Payer: Self-pay | Admitting: Orthopaedic Surgery

## 2022-01-07 NOTE — Telephone Encounter (Signed)
Pt called requesting we call pharmacy with per auth for her oxycodone. Pt states her meds was called in yesterday. Her methocarbamol is ready but pain med need pre auth. Please call pharmacy and call pt when pre auth sent. Pt phone number is [phone].

## 2022-01-07 NOTE — Telephone Encounter (Signed)
Yes, I'll be down shortly with faxes. Thanks

## 2022-01-07 NOTE — Telephone Encounter (Signed)
Received faxed that stated PA request was cancelled and that medication is covered. Tried calling pharmacy but I cant get anyone on the phone and have called multiple times. I lvm for pt to cb to discuss

## 2022-01-07 NOTE — Telephone Encounter (Signed)
Have you received any faxes on this? I have tried to call the pharmacy but no one picks up and not able to completed on covermymeds.

## 2022-01-10 DIAGNOSIS — M6281 Muscle weakness (generalized): Secondary | ICD-10-CM | POA: Diagnosis not present

## 2022-01-10 DIAGNOSIS — M25562 Pain in left knee: Secondary | ICD-10-CM | POA: Diagnosis not present

## 2022-01-13 DIAGNOSIS — M6281 Muscle weakness (generalized): Secondary | ICD-10-CM | POA: Diagnosis not present

## 2022-01-13 DIAGNOSIS — M25562 Pain in left knee: Secondary | ICD-10-CM | POA: Diagnosis not present

## 2022-01-14 ENCOUNTER — Ambulatory Visit: Payer: Medicare Other

## 2022-01-14 ENCOUNTER — Other Ambulatory Visit (INDEPENDENT_AMBULATORY_CARE_PROVIDER_SITE_OTHER): Payer: Medicare Other

## 2022-01-14 VITALS — BP 146/82 | Temp 97.5°F

## 2022-01-14 DIAGNOSIS — E1169 Type 2 diabetes mellitus with other specified complication: Secondary | ICD-10-CM | POA: Diagnosis not present

## 2022-01-14 DIAGNOSIS — E785 Hyperlipidemia, unspecified: Secondary | ICD-10-CM | POA: Diagnosis not present

## 2022-01-14 DIAGNOSIS — E119 Type 2 diabetes mellitus without complications: Secondary | ICD-10-CM

## 2022-01-14 DIAGNOSIS — Z23 Encounter for immunization: Secondary | ICD-10-CM | POA: Diagnosis not present

## 2022-01-14 NOTE — Addendum Note (Signed)
Addended by: Geradine Girt D on: 01/14/2022 10:11 AM   Modules accepted: Orders

## 2022-01-14 NOTE — Progress Notes (Signed)
Flu shot document under lad visit

## 2022-01-15 LAB — MICROALBUMIN, URINE: Microalb, Ur: 7 mg/dL

## 2022-01-17 DIAGNOSIS — M6281 Muscle weakness (generalized): Secondary | ICD-10-CM | POA: Diagnosis not present

## 2022-01-17 DIAGNOSIS — M25562 Pain in left knee: Secondary | ICD-10-CM | POA: Diagnosis not present

## 2022-01-19 DIAGNOSIS — M25562 Pain in left knee: Secondary | ICD-10-CM | POA: Diagnosis not present

## 2022-01-19 DIAGNOSIS — M6281 Muscle weakness (generalized): Secondary | ICD-10-CM | POA: Diagnosis not present

## 2022-01-24 ENCOUNTER — Ambulatory Visit (INDEPENDENT_AMBULATORY_CARE_PROVIDER_SITE_OTHER): Payer: Medicare Other | Admitting: Physician Assistant

## 2022-01-24 ENCOUNTER — Encounter: Payer: Self-pay | Admitting: Physician Assistant

## 2022-01-24 DIAGNOSIS — M24662 Ankylosis, left knee: Secondary | ICD-10-CM

## 2022-01-24 DIAGNOSIS — Z96652 Presence of left artificial knee joint: Secondary | ICD-10-CM

## 2022-01-24 NOTE — Progress Notes (Signed)
HPI: Mrs. Hiser returns today status post left total knee arthroplasty 12/07/2021.  She states she is working with therapy but is unable to get the knee past 85 degrees of flexion.  She is having pain about the knee and swelling.  States he did not have this much pain in the right knee which she had replaced in November 2022.  Right knee she has full extension and flexes to approximately 105 degrees.  She denies any new injury to the left knee.   Physical exam: General well-developed well-nourished female in no acute distress. Bilateral knees: Good range of motion in the right knee without pain.  Right knee surgical incisions well-healed.  Left knee lacks full extension by approximately 3 degrees and flexes to 85 degrees.  No instability valgus varus stressing.  Calf supple nontender on the left.  Surgical incision left knee is well-healed no signs of infection.  Impression: Left total knee arthrofibrosis  Plan: Recommend left knee manipulation under anesthesia.  Patient is agreeable with this.  We will proceed with this procedure the near future.  See her back postop.  Questions were encouraged and answered.

## 2022-01-28 ENCOUNTER — Telehealth: Payer: Self-pay | Admitting: *Deleted

## 2022-01-28 NOTE — Telephone Encounter (Signed)
Ortho bundle 30 day call completed. °

## 2022-01-28 NOTE — Telephone Encounter (Signed)
Ortho bundle 30 day call attempted; no answer and left VM requesting call back. 

## 2022-02-01 ENCOUNTER — Telehealth: Payer: Self-pay | Admitting: Internal Medicine

## 2022-02-01 DIAGNOSIS — Z7982 Long term (current) use of aspirin: Secondary | ICD-10-CM | POA: Diagnosis not present

## 2022-02-01 DIAGNOSIS — Z880 Allergy status to penicillin: Secondary | ICD-10-CM | POA: Diagnosis not present

## 2022-02-01 DIAGNOSIS — K5900 Constipation, unspecified: Secondary | ICD-10-CM | POA: Diagnosis not present

## 2022-02-01 DIAGNOSIS — Z79899 Other long term (current) drug therapy: Secondary | ICD-10-CM | POA: Diagnosis not present

## 2022-02-01 DIAGNOSIS — R112 Nausea with vomiting, unspecified: Secondary | ICD-10-CM | POA: Diagnosis not present

## 2022-02-01 DIAGNOSIS — Z7984 Long term (current) use of oral hypoglycemic drugs: Secondary | ICD-10-CM | POA: Diagnosis not present

## 2022-02-01 DIAGNOSIS — E119 Type 2 diabetes mellitus without complications: Secondary | ICD-10-CM | POA: Diagnosis not present

## 2022-02-01 DIAGNOSIS — Z791 Long term (current) use of non-steroidal anti-inflammatories (NSAID): Secondary | ICD-10-CM | POA: Diagnosis not present

## 2022-02-01 DIAGNOSIS — R109 Unspecified abdominal pain: Secondary | ICD-10-CM | POA: Diagnosis not present

## 2022-02-01 DIAGNOSIS — I10 Essential (primary) hypertension: Secondary | ICD-10-CM | POA: Diagnosis not present

## 2022-02-01 DIAGNOSIS — Z96652 Presence of left artificial knee joint: Secondary | ICD-10-CM | POA: Diagnosis not present

## 2022-02-01 NOTE — Telephone Encounter (Signed)
Called and let Kathleen Greer know what Dr Renold Genta advised, she verbalized understanding

## 2022-02-01 NOTE — Telephone Encounter (Signed)
Kathleen Greer [id number]  [name] called to say she has not had a bowel movement in 3 days. She was taking a muscle relaxer and oxycodone. She had been vomiting for several days, then she quit taking the medication 2 days ago. She an appointment with Dr Rush Farmer on Thursday for stimulation, she is wandering if she needs to cancel that since she has not had bowel movement and she wants to know what to do about bowel movement.  She took a stool softener on Sunday and she mixed miralax with prune juice this morning. She said she called Dr Lenise Arena office and they referred her back to [facility] office.

## 2022-02-02 ENCOUNTER — Telehealth: Payer: Self-pay

## 2022-02-02 ENCOUNTER — Telehealth: Payer: Self-pay | Admitting: *Deleted

## 2022-02-02 ENCOUNTER — Other Ambulatory Visit: Payer: Self-pay | Admitting: Orthopaedic Surgery

## 2022-02-02 MED ORDER — ONDANSETRON HCL 4 MG PO TABS: 4.0000 mg | ORAL_TABLET | Freq: Three times a day (TID) | ORAL | 0 refills | Status: AC | PRN

## 2022-02-02 NOTE — Telephone Encounter (Signed)
I know Kathleen Greer talked to you about this patient. I have told her, that she only knows how she feels. She asked about something for nausea. Could she get something? She states she has had a BM after the enema that the ED gave her yesterday. No fever. I'll talk to her again and see what she wants to do, but maybe something for nausea would help.

## 2022-02-02 NOTE — Telephone Encounter (Signed)
I called patient to discuss having manipulation procedure tomorrow.  She asked for something for nausea.   I told her Dr. Ninfa Linden would like to do procedure tomorrow if she is up to it.  I spoke with Jamse Arn, RN and she will follow up with Dr. Ninfa Linden and patient about nausea meds.

## 2022-02-02 NOTE — Telephone Encounter (Addendum)
Called to check on patient and see how she was doing today after she went to emergency room yesterday. LVM

## 2022-02-03 DIAGNOSIS — G8918 Other acute postprocedural pain: Secondary | ICD-10-CM | POA: Diagnosis not present

## 2022-02-03 DIAGNOSIS — M24662 Ankylosis, left knee: Secondary | ICD-10-CM | POA: Diagnosis not present

## 2022-02-04 DIAGNOSIS — M25562 Pain in left knee: Secondary | ICD-10-CM | POA: Diagnosis not present

## 2022-02-04 DIAGNOSIS — M6281 Muscle weakness (generalized): Secondary | ICD-10-CM | POA: Diagnosis not present

## 2022-02-07 ENCOUNTER — Other Ambulatory Visit: Payer: Self-pay | Admitting: Internal Medicine

## 2022-02-07 DIAGNOSIS — Z1231 Encounter for screening mammogram for malignant neoplasm of breast: Secondary | ICD-10-CM

## 2022-02-08 DIAGNOSIS — M6281 Muscle weakness (generalized): Secondary | ICD-10-CM | POA: Diagnosis not present

## 2022-02-08 DIAGNOSIS — M25562 Pain in left knee: Secondary | ICD-10-CM | POA: Diagnosis not present

## 2022-02-11 DIAGNOSIS — M6281 Muscle weakness (generalized): Secondary | ICD-10-CM | POA: Diagnosis not present

## 2022-02-11 DIAGNOSIS — M25562 Pain in left knee: Secondary | ICD-10-CM | POA: Diagnosis not present

## 2022-02-14 DIAGNOSIS — M25562 Pain in left knee: Secondary | ICD-10-CM | POA: Diagnosis not present

## 2022-02-14 DIAGNOSIS — M6281 Muscle weakness (generalized): Secondary | ICD-10-CM | POA: Diagnosis not present

## 2022-02-16 ENCOUNTER — Ambulatory Visit (INDEPENDENT_AMBULATORY_CARE_PROVIDER_SITE_OTHER): Payer: Medicare Other | Admitting: Orthopaedic Surgery

## 2022-02-16 ENCOUNTER — Encounter: Payer: Self-pay | Admitting: Orthopaedic Surgery

## 2022-02-16 DIAGNOSIS — Z96651 Presence of right artificial knee joint: Secondary | ICD-10-CM

## 2022-02-16 DIAGNOSIS — M24662 Ankylosis, left knee: Secondary | ICD-10-CM

## 2022-02-16 NOTE — Progress Notes (Signed)
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  She is 76 years old.  She reports that it is getting better now.  On my exam she still has limitations in her flexion and extension of that left knee.  It is improved since her last visit but it is hard for her to let me push her much past 90 degrees.  I did flex her back to 120 degrees right after surgery.  She understands that it is essential she continue aggressive physical therapy to work on her mobility as well as her balance and coordination and flexion and extension of that knee.  She will continue outpatient therapy.  I will see her back in 4 weeks with a standing AP and lateral of her left operative knee.

## 2022-02-18 DIAGNOSIS — M25562 Pain in left knee: Secondary | ICD-10-CM | POA: Diagnosis not present

## 2022-02-18 DIAGNOSIS — M6281 Muscle weakness (generalized): Secondary | ICD-10-CM | POA: Diagnosis not present

## 2022-02-21 NOTE — Progress Notes (Signed)
Microalbumin was completed but not creatinine.  She would need Urine albumin creatinine ratio test to close gap.  Would you like me to send kit out for pt to complete so we can close gap or would you prefer to order?  Just let me know.  I'll help in any way that I can.

## 2022-02-22 DIAGNOSIS — M6281 Muscle weakness (generalized): Secondary | ICD-10-CM | POA: Diagnosis not present

## 2022-02-22 DIAGNOSIS — M25562 Pain in left knee: Secondary | ICD-10-CM | POA: Diagnosis not present

## 2022-02-24 DIAGNOSIS — M25562 Pain in left knee: Secondary | ICD-10-CM | POA: Diagnosis not present

## 2022-02-24 DIAGNOSIS — M6281 Muscle weakness (generalized): Secondary | ICD-10-CM | POA: Diagnosis not present

## 2022-02-28 DIAGNOSIS — M25562 Pain in left knee: Secondary | ICD-10-CM | POA: Diagnosis not present

## 2022-02-28 DIAGNOSIS — M6281 Muscle weakness (generalized): Secondary | ICD-10-CM | POA: Diagnosis not present

## 2022-03-04 DIAGNOSIS — M6281 Muscle weakness (generalized): Secondary | ICD-10-CM | POA: Diagnosis not present

## 2022-03-04 DIAGNOSIS — M25562 Pain in left knee: Secondary | ICD-10-CM | POA: Diagnosis not present

## 2022-03-07 DIAGNOSIS — M25562 Pain in left knee: Secondary | ICD-10-CM | POA: Diagnosis not present

## 2022-03-07 DIAGNOSIS — M6281 Muscle weakness (generalized): Secondary | ICD-10-CM | POA: Diagnosis not present

## 2022-03-15 DIAGNOSIS — M6281 Muscle weakness (generalized): Secondary | ICD-10-CM | POA: Diagnosis not present

## 2022-03-15 DIAGNOSIS — M25562 Pain in left knee: Secondary | ICD-10-CM | POA: Diagnosis not present

## 2022-03-21 ENCOUNTER — Telehealth: Payer: Self-pay | Admitting: *Deleted

## 2022-03-21 ENCOUNTER — Ambulatory Visit: Payer: Medicare Other | Admitting: Orthopaedic Surgery

## 2022-03-21 ENCOUNTER — Encounter: Payer: Self-pay | Admitting: Orthopaedic Surgery

## 2022-03-21 ENCOUNTER — Ambulatory Visit (INDEPENDENT_AMBULATORY_CARE_PROVIDER_SITE_OTHER): Payer: Medicare Other

## 2022-03-21 DIAGNOSIS — M24662 Ankylosis, left knee: Secondary | ICD-10-CM

## 2022-03-21 MED ORDER — GABAPENTIN 300 MG PO CAPS: 300.0000 mg | ORAL_CAPSULE | Freq: Every day | ORAL | 1 refills | Status: DC

## 2022-03-21 MED ORDER — DICLOFENAC SODIUM 75 MG PO TBEC: 75.0000 mg | DELAYED_RELEASE_TABLET | Freq: Two times a day (BID) | ORAL | 3 refills | Status: DC | PRN

## 2022-03-21 NOTE — Telephone Encounter (Signed)
Ortho bundle 90 day in person meeting completed.  

## 2022-03-21 NOTE — Progress Notes (Signed)
The patient is now over 3 months status post a left total knee arthroplasty.  We replaced her right knee remotely.  The right knee is done very well with the left knee is still been a struggle for her.  We even had to manipulate her several weeks ago.  We were able to get her motion back but she is lost some of that.  She is still not having a good night sleep and has pins-and-needles type sensations at night.  She is not a diabetic.  She is not really taking any pain medication either at this point.  She lacks full extension by few degrees of the left knee and there are still some swelling and her flexion is just past 90 degrees.  We will try diclofenac to take as needed for an anti-inflammatory twice daily and gabapentin 300 mg at bedtime.  I would like to see her back in 4 weeks to see how she is responding to those medications and time.  Of note x-rays of the left knee today show well-seated total knee arthroplasty with no complicating features.  It also shows her right knee on the standing film and that x-ray shows a good implant on that side with good alignment.  All question and concerns were answered and addressed.

## 2022-03-22 ENCOUNTER — Other Ambulatory Visit: Payer: Self-pay | Admitting: Internal Medicine

## 2022-03-29 DIAGNOSIS — M6281 Muscle weakness (generalized): Secondary | ICD-10-CM | POA: Diagnosis not present

## 2022-03-29 DIAGNOSIS — M25562 Pain in left knee: Secondary | ICD-10-CM | POA: Diagnosis not present

## 2022-03-31 ENCOUNTER — Ambulatory Visit: Payer: Medicare Other

## 2022-04-01 ENCOUNTER — Ambulatory Visit
Admission: RE | Admit: 2022-04-01 | Discharge: 2022-04-01 | Disposition: A | Discharge status: Home / Self Care | Payer: Medicare Other | Source: Ambulatory Visit | Attending: Internal Medicine | Admitting: Internal Medicine

## 2022-04-01 DIAGNOSIS — Z1231 Encounter for screening mammogram for malignant neoplasm of breast: Secondary | ICD-10-CM

## 2022-04-05 DIAGNOSIS — M6281 Muscle weakness (generalized): Secondary | ICD-10-CM | POA: Diagnosis not present

## 2022-04-05 DIAGNOSIS — M25562 Pain in left knee: Secondary | ICD-10-CM | POA: Diagnosis not present

## 2022-04-10 ENCOUNTER — Other Ambulatory Visit: Payer: Self-pay | Admitting: Internal Medicine

## 2022-04-10 DIAGNOSIS — R7302 Impaired glucose tolerance (oral): Secondary | ICD-10-CM

## 2022-04-14 DIAGNOSIS — M6281 Muscle weakness (generalized): Secondary | ICD-10-CM | POA: Diagnosis not present

## 2022-04-14 DIAGNOSIS — M25562 Pain in left knee: Secondary | ICD-10-CM | POA: Diagnosis not present

## 2022-04-20 ENCOUNTER — Ambulatory Visit: Payer: Medicare Other | Admitting: Orthopaedic Surgery

## 2022-04-20 ENCOUNTER — Encounter: Payer: Self-pay | Admitting: Orthopaedic Surgery

## 2022-04-20 DIAGNOSIS — Z96652 Presence of left artificial knee joint: Secondary | ICD-10-CM | POA: Diagnosis not present

## 2022-04-20 NOTE — Progress Notes (Signed)
HPI: Kathleen Greer returns today status post left total knee arthroplasty 12/07/2021.  She continues to work with therapy on range of motion and strengthening.  She states she has good days and bad days.  Ranks her pain 7-8 out of 10 pain at worst.  She does feel that the oral diclofenac has helped some with the pain.  She did not take the Neurontin due to the fact that she was "scared to take it".   Review of systems: See HPI otherwise negative   Physical exam: General well-developed well-nourished female in no acute distress. Left knee full extension flexion to 95 degrees actively and passively.  No instability valgus varus stressing.  Slight edema left lower leg compared to the right.  No calf tenderness bilaterally.  Surgical incision left knee is well-healed.  Impression: Status post left total knee arthroplasty  Plan: Will continue physical therapy to work on range of motion and strengthening.  Discussed with her getting thigh-high compression hose and wearing these during the day.  Will see her back in 2 months to see how she is doing overall.  Questions were encouraged and answered at length.

## 2022-04-25 DIAGNOSIS — M6281 Muscle weakness (generalized): Secondary | ICD-10-CM | POA: Diagnosis not present

## 2022-04-25 DIAGNOSIS — M25562 Pain in left knee: Secondary | ICD-10-CM | POA: Diagnosis not present

## 2022-05-04 DIAGNOSIS — M6281 Muscle weakness (generalized): Secondary | ICD-10-CM | POA: Diagnosis not present

## 2022-05-04 DIAGNOSIS — M25562 Pain in left knee: Secondary | ICD-10-CM | POA: Diagnosis not present

## 2022-05-10 DIAGNOSIS — M25562 Pain in left knee: Secondary | ICD-10-CM | POA: Diagnosis not present

## 2022-05-10 DIAGNOSIS — M6281 Muscle weakness (generalized): Secondary | ICD-10-CM | POA: Diagnosis not present

## 2022-06-10 NOTE — Progress Notes (Signed)
Annual Wellness Visit    Patient Care Team: Elby Showers, MD as PCP - General (Internal Medicine)  Visit Date: 06/17/22   Chief Complaint  Patient presents with   Medicare Wellness    Subjective:   Patient: Kathleen Greer, Female    DOB: 1946-01-01, 77 y.o.   MRN: [id number]  Kathleen Greer is a 77 y.o. Female who presents today for her Annual Wellness Visit. She has a history of arthritis, dependent edema, diabetes, generalized headaches, GERD, hyperkalemia, hypertension, IBS, positional vertigo, scoliosis.  Status post left and right total knee arthroplasty. Having some pain in left knee following arthroplasty on 12/07/21. Follow-up scheduled with orthopedist 06/20/22. No pain in right knee.  History of Type 2 diabetes mellitus treated with Glucophage 1000 mg daily with breakfast. A1c at 6.2 % on 06/13/22.  History of hypertension treated with Norvasc 10 mg daily, Cozaar 100 mg daily. Blood pressure normal at 138/88 today, down from 146/82 on 01/14/22.  History of hyperlipidemia treated with Crestor 5 mg daily. Lipid panel normal on 06/13/22.  History of dependent edema treated with Demadex 20 mg daily in the morning. Some swelling but reports generally well-controlled.  History of hyperkalemia treated with Klor-Con M 20 meq twice daily.  History of GERD treated with Prilosec 20 mg daily.  History of Vitamin D deficiency treated with Vitamin D 2,000 units in the morning.  Has a history of numbness in left great toe.  AST mildly elevated at 39 and ALT at 62.  CBC normal. TSH normal. Kidney function normal. Liver enzymes AST at 39 U/L and ALT at 62 U/L. slightly elevated. Does not drink alcohol. Has been staying active with gardening.  Mammogram last completed 04/04/22. No mammographic evidence of malignancy. Recommended repeat in 07-19-22.  Colonoscopy last completed 01/14/14. Examination normal. Recommended repeat in 19-Jul-2023.   DEXA scan last completed 09/14/21. The BMD  measured at Forearm Radius 33% is 0.855 g/cm2 with a T-score of -0.2. This patient is considered normal according to [facility]) criteria.  Her grandson is living with her. This is causing her some stress.   Past Medical History:  Diagnosis Date   Arthritis    Blood transfusion without reported diagnosis 04/18/1968   after hysterectomy   Dependent edema    Diabetes (HCC)    type 2   Generalized headaches    GERD (gastroesophageal reflux disease)    Hyperkalemia    Hypertension    IBS (irritable bowel syndrome)    Positional vertigo    Scoliosis      Family History  Problem Relation Age of Onset   Heart disease Mother    Stroke Mother    Hypertension Mother    Heart disease Father    Breast cancer Sister    Heart disease Brother    Hypertension Son    Prostate cancer Son    Colon cancer Neg Hx      Social History   Social History Narrative   Social history: Patient is retired and 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 She does not smoke or consume alcohol.      Family history: Adult son with hypertension. Patient's father died with an MI. Mother died of an MI. 1 brother died with heart failure. Another brother died in 18-Jul-2004 with cirrhosis of the liver. Sister died at age 82. Another sister whose health is apparently unchanged     Review of Systems  Constitutional:  Negative for chills, fever,  malaise/fatigue and weight loss.  HENT:  Negative for hearing loss, sinus pain and sore throat.   Respiratory:  Negative for cough, hemoptysis and shortness of breath.   Cardiovascular:  Negative for chest pain, palpitations, leg swelling and PND.  Gastrointestinal:  Negative for abdominal pain, constipation, diarrhea, heartburn, nausea and vomiting.  Genitourinary:  Negative for dysuria, frequency and urgency.  Musculoskeletal:  Positive for joint pain (Left knee). Negative for back pain, myalgias and neck pain.  Skin:  Negative for itching and  rash.  Neurological:  Negative for dizziness, tingling, seizures and headaches.  Endo/Heme/Allergies:  Negative for polydipsia.  Psychiatric/Behavioral:  Negative for depression. The patient is not nervous/anxious.       Objective:   Vitals: BP 138/88   Pulse 64   Temp (!) 96.8 F (36 C) (Tympanic)   Resp 16   Ht 5' 3.75" (1.619 m)   Wt 173 lb 8 oz (78.7 kg)   SpO2 97%   BMI 30.02 kg/m   Physical Exam Vitals and nursing note reviewed.  Constitutional:      General: She is not in acute distress.    Appearance: Normal appearance. She is not ill-appearing or toxic-appearing.  HENT:     Head: Normocephalic and atraumatic.     Right Ear: Hearing, tympanic membrane, ear canal and external ear normal.     Left Ear: Hearing, tympanic membrane, ear canal and external ear normal.     Mouth/Throat:     Pharynx: Oropharynx is clear.  Eyes:     Extraocular Movements: Extraocular movements intact.     Pupils: Pupils are equal, round, and reactive to light.  Neck:     Thyroid: No thyroid mass, thyromegaly or thyroid tenderness.     Vascular: No carotid bruit.  Cardiovascular:     Rate and Rhythm: Normal rate and regular rhythm. No extrasystoles are present.    Pulses: Normal pulses.          Dorsalis pedis pulses are 2+ on the right side and 2+ on the left side.       Posterior tibial pulses are 2+ on the right side and 2+ on the left side.     Heart sounds: Normal heart sounds. No murmur heard.    No friction rub. No gallop.  Pulmonary:     Effort: Pulmonary effort is normal.     Breath sounds: Normal breath sounds. No decreased breath sounds, wheezing, rhonchi or rales.  Chest:     Chest wall: No mass.  Abdominal:     Palpations: Abdomen is soft. There is no hepatomegaly, splenomegaly or mass.     Tenderness: There is no abdominal tenderness.     Hernia: No hernia is present.  Musculoskeletal:     Cervical back: Normal range of motion.     Right lower leg: No edema.      Left lower leg: No edema.     Comments: Osteoarthritis of hands. Heberden's and Bouchard's nodes on hands.  Radial deviation on right 4th finger.  Ulner deviation in right 3rd finger.  Lymphadenopathy:     Cervical: No cervical adenopathy.     Upper Body:     Right upper body: No supraclavicular adenopathy.     Left upper body: No supraclavicular adenopathy.  Skin:    General: Skin is warm and dry.  Neurological:     General: No focal deficit present.     Mental Status: She is alert and oriented to person, place, and time.  Mental status is at baseline.     Sensory: Sensation is intact.     Motor: Motor function is intact. No weakness.     Deep Tendon Reflexes: Reflexes are normal and symmetric.  Psychiatric:        Attention and Perception: Attention normal.        Mood and Affect: Mood normal.        Speech: Speech normal.        Behavior: Behavior normal.        Thought Content: Thought content normal.        Cognition and Memory: Cognition normal.        Judgment: Judgment normal.     Most recent functional status assessment:    06/17/2022   10:06 AM  In your present state of health, do you have any difficulty performing the following activities:  Hearing? 0  Vision? 0  Difficulty concentrating or making decisions? 0  Walking or climbing stairs? 1  Dressing or bathing? 0  Doing errands, shopping? 0   Most recent fall risk assessment:    06/17/2022   10:05 AM  Fall Risk   Falls in the past year? 0  Number falls in past yr: 0  Injury with Fall? 0  Risk for fall due to : No Fall Risks    Most recent depression screenings:    06/17/2022   10:05 AM 06/11/2021   10:01 AM  PHQ 2/9 Scores  PHQ - 2 Score 0 0   Most recent cognitive screening:    06/17/2022   10:07 AM  6CIT Screen  What Year? 0 points  What month? 0 points  What time? 0 points  Count back from 20 0 points  Months in reverse 0 points  Repeat phrase 0 points  Total Score 0 points     Results:    Studies obtained and personally reviewed by me:  Mammogram last completed 04/04/22. No mammographic evidence of malignancy. Recommended repeat in 2024.  Colonoscopy last completed 01/14/14. Examination normal. Recommended repeat in 2025.   DEXA scan last completed 09/14/21. The BMD measured at Forearm Radius 33% is 0.855 g/cm2 with a T-score of -0.2. This patient is considered normal according to [facility]) criteria.  Labs:       Component Value Date/Time   NA 142 06/13/2022 1049   K 4.4 06/13/2022 1049   CL 104 06/13/2022 1049   CO2 31 06/13/2022 1049   GLUCOSE 138 (H) 06/13/2022 1049   BUN 17 06/13/2022 1049   CREATININE 0.89 06/13/2022 1049   CALCIUM 9.4 06/13/2022 1049   PROT 7.1 06/13/2022 1049   ALBUMIN 4.4 02/25/2016 1133   AST 39 (H) 06/13/2022 1049   ALT 62 (H) 06/13/2022 1049   ALKPHOS 74 02/25/2016 1133   BILITOT 0.9 06/13/2022 1049   GFRNONAA 49 (L) 12/08/2021 0626   GFRNONAA 58 (L) 05/01/2020 0916   GFRAA 67 05/01/2020 0916     Lab Results  Component Value Date   WBC 6.2 06/13/2022   HGB 13.1 06/13/2022   HCT 39.0 06/13/2022   MCV 82.1 06/13/2022   PLT 275 06/13/2022    Lab Results  Component Value Date   CHOL 160 06/13/2022   HDL 62 06/13/2022   LDLCALC 81 06/13/2022   TRIG 89 06/13/2022   CHOLHDL 2.6 06/13/2022    Lab Results  Component Value Date   HGBA1C 6.2 (H) 06/13/2022     Lab Results  Component Value Date  TSH 1.81 06/13/2022    Assessment & Plan:   Osteoarthritis: Status post left and right total knee arthroplasty. Having some pain in left knee following arthroplasty on 12/07/21. Follow-up scheduled with orthopedist 06/20/22. No pain in right knee.  Type 2 diabetes mellitus: treated with Glucophage 1000 mg daily with breakfast. A1c at 6.2 % on 06/13/22.  Hypertension: treated with Norvasc 10 mg daily, Cozaar 100 mg daily. Blood pressure normal at 138/88 today, down from 146/82 on 01/14/22.  Hyperlipidemia:  treated with Crestor 5 mg daily. Lipid panel normal on 06/13/22.  Dependent edema: treated with Demadex 20 mg daily in the morning. Some swelling but reports generally well-controlled.  Hyperkalemia: treated with Klor-Con M 20 meq twice daily.  History of GERD: treated with Prilosec 20 mg daily.  History of Vitamin D deficiency: treated with Vitamin D 2,000 units in the morning.  Mild elevatioj of LFTs RTC in 3 months for follow up. May be fatty liver. She does not consume alcohol.  Mammogram: last completed 04/04/22. No mammographic evidence of malignancy. Recommended repeat in 2024.  Colonoscopy: last completed 01/14/14. Examination normal. Recommended repeat in 2025.   DEXA scan: last completed 09/14/21. The BMD measured at Forearm Radius 33% is 0.855 g/cm2 with a T-score of -0.2. This patient is considered normal according to [facility]) criteria.  Vaccine Counseling: UTD on flu, Covid-19, shingles, RSV, pneumonia, tetanus vaccines.  Return for liver enzyme check in 3 months.     Annual wellness visit done today including the all of the following: Reviewed patient's Family Medical History Reviewed and updated list of patient's medical providers Assessment of cognitive impairment was done Assessed patient's functional ability Established a written schedule for health screening Rooks Completed and Reviewed  Discussed health benefits of physical activity, and encouraged her to engage in regular exercise appropriate for her age and condition.        I,Alexander Ruley,acting as a Education administrator for Elby Showers, MD.,have documented all relevant documentation on the behalf of Elby Showers, MD,as directed by  Elby Showers, MD while in the presence of Elby Showers, MD.   I, Elby Showers, MD, have reviewed all documentation for this visit. The documentation on 07/01/22 for the exam, diagnosis, procedures, and orders are all accurate and  complete.

## 2022-06-13 ENCOUNTER — Other Ambulatory Visit: Payer: Self-pay

## 2022-06-13 ENCOUNTER — Other Ambulatory Visit: Payer: Medicare Other

## 2022-06-13 ENCOUNTER — Telehealth: Payer: Self-pay | Admitting: Internal Medicine

## 2022-06-13 DIAGNOSIS — I10 Essential (primary) hypertension: Secondary | ICD-10-CM | POA: Diagnosis not present

## 2022-06-13 DIAGNOSIS — E785 Hyperlipidemia, unspecified: Secondary | ICD-10-CM

## 2022-06-13 DIAGNOSIS — R5383 Other fatigue: Secondary | ICD-10-CM

## 2022-06-13 DIAGNOSIS — E1169 Type 2 diabetes mellitus with other specified complication: Secondary | ICD-10-CM | POA: Diagnosis not present

## 2022-06-13 DIAGNOSIS — E119 Type 2 diabetes mellitus without complications: Secondary | ICD-10-CM | POA: Diagnosis not present

## 2022-06-13 MED ORDER — GLIPIZIDE 5 MG PO TABS: ORAL_TABLET | ORAL | 3 refills | Status: AC

## 2022-06-13 NOTE — Telephone Encounter (Signed)
Kathleen Greer needs refill on below medication she is completely out.  glipiZIDE (GLUCOTROL) 5 MG tablet   [place], [place] - [address] [place] Phone: [phone]  Fax: [phone]

## 2022-06-14 LAB — HEMOGLOBIN A1C
Hgb A1c MFr Bld: 6.2 % of total Hgb — ABNORMAL HIGH (ref <5.7)
Mean Plasma Glucose: 131 mg/dL
eAG (mmol/L): 7.3 mmol/L

## 2022-06-14 LAB — CBC WITH DIFFERENTIAL/PLATELET
Absolute Monocytes: 341 cells/uL (ref 200–950)
Basophils Absolute: 31 cells/uL (ref 0–200)
Basophils Relative: 0.5 %
Eosinophils Absolute: 254 cells/uL (ref 15–500)
Eosinophils Relative: 4.1 %
HCT: 39 % (ref 35.0–45.0)
Hemoglobin: 13.1 g/dL (ref 11.7–15.5)
Lymphs Abs: 3398 cells/uL (ref 850–3900)
MCH: 27.6 pg (ref 27.0–33.0)
MCHC: 33.6 g/dL (ref 32.0–36.0)
MCV: 82.1 fL (ref 80.0–100.0)
MPV: 10.5 fL (ref 7.5–12.5)
Monocytes Relative: 5.5 %
Neutro Abs: 2176 cells/uL (ref 1500–7800)
Neutrophils Relative %: 35.1 %
Platelets: 275 10*3/uL (ref 140–400)
RBC: 4.75 10*6/uL (ref 3.80–5.10)
RDW: 14.3 % (ref 11.0–15.0)
Total Lymphocyte: 54.8 %
WBC: 6.2 10*3/uL (ref 3.8–10.8)

## 2022-06-14 LAB — COMPLETE METABOLIC PANEL WITH GFR
AG Ratio: 1.4 (calc) (ref 1.0–2.5)
ALT: 62 U/L — ABNORMAL HIGH (ref 6–29)
AST: 39 U/L — ABNORMAL HIGH (ref 10–35)
Albumin: 4.2 g/dL (ref 3.6–5.1)
Alkaline phosphatase (APISO): 126 U/L (ref 37–153)
BUN: 17 mg/dL (ref 7–25)
CO2: 31 mmol/L (ref 20–32)
Calcium: 9.4 mg/dL (ref 8.6–10.4)
Chloride: 104 mmol/L (ref 98–110)
Creat: 0.89 mg/dL (ref 0.60–1.00)
Globulin: 2.9 g/dL (calc) (ref 1.9–3.7)
Glucose, Bld: 138 mg/dL — ABNORMAL HIGH (ref 65–99)
Potassium: 4.4 mmol/L (ref 3.5–5.3)
Sodium: 142 mmol/L (ref 135–146)
Total Bilirubin: 0.9 mg/dL (ref 0.2–1.2)
Total Protein: 7.1 g/dL (ref 6.1–8.1)
eGFR: 68 mL/min/{1.73_m2} (ref >=60)

## 2022-06-14 LAB — LIPID PANEL
Cholesterol: 160 mg/dL (ref <200)
HDL: 62 mg/dL (ref >=50)
LDL Cholesterol (Calc): 81 mg/dL (calc)
Non-HDL Cholesterol (Calc): 98 mg/dL (calc) (ref <130)
Total CHOL/HDL Ratio: 2.6 (calc) (ref <5.0)
Triglycerides: 89 mg/dL (ref <150)

## 2022-06-14 LAB — MICROALBUMIN / CREATININE URINE RATIO
Creatinine, Urine: 134 mg/dL (ref 20–275)
Microalb Creat Ratio: 21 mcg/mg creat (ref <30)
Microalb, Ur: 2.8 mg/dL

## 2022-06-14 LAB — TSH: TSH: 1.81 mIU/L (ref 0.40–4.50)

## 2022-06-17 ENCOUNTER — Ambulatory Visit (INDEPENDENT_AMBULATORY_CARE_PROVIDER_SITE_OTHER): Payer: Medicare Other | Admitting: Internal Medicine

## 2022-06-17 ENCOUNTER — Encounter: Payer: Self-pay | Admitting: Internal Medicine

## 2022-06-17 ENCOUNTER — Telehealth: Payer: Self-pay | Admitting: Orthopaedic Surgery

## 2022-06-17 VITALS — BP 138/88 | HR 64 | Temp 96.8°F | Resp 16 | Ht 63.75 in | Wt 173.5 lb

## 2022-06-17 DIAGNOSIS — Z96651 Presence of right artificial knee joint: Secondary | ICD-10-CM

## 2022-06-17 DIAGNOSIS — I10 Essential (primary) hypertension: Secondary | ICD-10-CM | POA: Diagnosis not present

## 2022-06-17 DIAGNOSIS — E785 Hyperlipidemia, unspecified: Secondary | ICD-10-CM | POA: Diagnosis not present

## 2022-06-17 DIAGNOSIS — R609 Edema, unspecified: Secondary | ICD-10-CM

## 2022-06-17 DIAGNOSIS — E119 Type 2 diabetes mellitus without complications: Secondary | ICD-10-CM | POA: Diagnosis not present

## 2022-06-17 DIAGNOSIS — Z86010 Personal history of colonic polyps: Secondary | ICD-10-CM

## 2022-06-17 DIAGNOSIS — Z96652 Presence of left artificial knee joint: Secondary | ICD-10-CM

## 2022-06-17 DIAGNOSIS — E1169 Type 2 diabetes mellitus with other specified complication: Secondary | ICD-10-CM | POA: Diagnosis not present

## 2022-06-17 DIAGNOSIS — Z0000 Encounter for general adult medical examination without abnormal findings: Secondary | ICD-10-CM

## 2022-06-17 DIAGNOSIS — R7989 Other specified abnormal findings of blood chemistry: Secondary | ICD-10-CM

## 2022-06-17 DIAGNOSIS — K219 Gastro-esophageal reflux disease without esophagitis: Secondary | ICD-10-CM | POA: Diagnosis not present

## 2022-06-17 DIAGNOSIS — Z6830 Body mass index (BMI) 30.0-30.9, adult: Secondary | ICD-10-CM

## 2022-06-17 DIAGNOSIS — R319 Hematuria, unspecified: Secondary | ICD-10-CM

## 2022-06-17 LAB — POCT URINALYSIS DIPSTICK
Glucose, UA: NEGATIVE
Ketones, UA: 15
Nitrite, UA: NEGATIVE
Protein, UA: POSITIVE — AB
Spec Grav, UA: 1.015 (ref 1.010–1.025)
Urobilinogen, UA: 0.2 E.U./dL — AB
pH, UA: 6 (ref 5.0–8.0)

## 2022-06-17 NOTE — Patient Instructions (Addendum)
  Kathleen Greer , Thank you for taking time to come for your Medicare Wellness Visit. I appreciate your ongoing commitment to your health goals. Please review the following plan we discussed and let me know if I can assist you in the future.       These are the goals we discussed:  Goals   None     This is a list of the screening recommended for you and due dates:  Health Maintenance  Topic Date Due   Complete foot exam   06/08/2021   Medicare Annual Wellness Visit  06/11/2022   Eye exam for diabetics  08/24/2022   Hemoglobin A1C  12/12/2022   Yearly kidney function blood test for diabetes  06/14/2023   Yearly kidney health urinalysis for diabetes  06/14/2023   Colon Cancer Screening  01/15/2024   DTaP/Tdap/Td vaccine (3 - Td or Tdap) 10/31/2029   Pneumonia Vaccine  Completed   Flu Shot  Completed   DEXA scan (bone density measurement)  Completed   Hepatitis C Screening: USPSTF Recommendation to screen - Ages 10-79 yo.  Addressed   HPV Vaccine  Aged Out   COVID-19 Vaccine  Discontinued   Zoster (Shingles) Vaccine  Discontinued     Continue close follow up with orthopedist. Watch BP. Watch diet and walk some if possible.RTC in3 months for liver panel. Mild elevation of liver functions may be due to inactivity/fatty liver. Watch diet and try to get more exercise. No recents infections. Doubt this is due to medication.

## 2022-06-17 NOTE — Telephone Encounter (Signed)
PT REQUESTING A HANDICAP PLACARD. PLEASE CALL TP WHEN READY FOR PICK UP. ABOUT TO EXPIRE. PT PHONE NUMBER IS [phone].

## 2022-06-17 NOTE — Telephone Encounter (Signed)
Lvm advising we can discuss with her on Monday. Pt is scheduled to see you

## 2022-06-18 LAB — URINE CULTURE
MICRO NUMBER:: 14637706
SPECIMEN QUALITY:: ADEQUATE

## 2022-06-20 ENCOUNTER — Ambulatory Visit (INDEPENDENT_AMBULATORY_CARE_PROVIDER_SITE_OTHER): Payer: Medicare Other | Admitting: Physician Assistant

## 2022-06-20 ENCOUNTER — Encounter: Payer: Self-pay | Admitting: Physician Assistant

## 2022-06-20 DIAGNOSIS — Z96652 Presence of left artificial knee joint: Secondary | ICD-10-CM

## 2022-06-20 NOTE — Telephone Encounter (Signed)
noted 

## 2022-06-20 NOTE — Progress Notes (Signed)
HPI: Kathleen Greer returns today for follow-up of her left total knee arthroplasty which was performed 12/07/2021.  She states she has some pain that comes and goes describes it as burning sensation about the knee.  No real radicular symptoms down the leg though.  She is finished with physical therapy in February.  States while she was doing the therapy the knee is doing very well.  Since then she has had some more stiffness and tightness she does do some exercises.  No new injury.  No fevers chills.  Review of systems: See HPI otherwise negative  Physical exam: General well-developed well-nourished female in no acute distress mood and affect appropriate.  Ambulates without any assistive device  Left knee full extension flexion to approximately 100 degrees.  No instability valgus varus stressing.  Slight keloid about the incision site.  No abnormal warmth erythema or effusion in the knee.  Calf supple nontender dorsiflexion plantarflexion left ankle intact.  Impression: Status post left total knee arthroplasty  Plan: Given her description of sharp burning pain about the knee at times recommend that she go back on her Neurontin which she has at home.  She will call us back and let us know what dose she is on and if she needs a refill.  Will also discuss with her at that time tapering her medication.  Would like for her to take the Neurontin for at least a month.  She also take vitamin B6 100 mg daily.  Follow-up with [place] in 6 weeks see how she is doing overall.  If she is doing well in 6 weeks she can just follow-up with [place] at 1 year postop and we will obtain an AP and lateral view of her lateral knee at that time.  Questions were encouraged and answered

## 2022-06-22 ENCOUNTER — Telehealth: Payer: Self-pay | Admitting: Physician Assistant

## 2022-06-22 NOTE — Telephone Encounter (Signed)
Called patient today about her Neurontin and she will take 300 mg at night for 1 month.  Then she will take half tablet every other day for a week and then discontinuing.  If she continues to have the pain at a month that she will follow-up with [place].  Otherwise she will follow-up with [place] at her year anniversary status post total knee arthroplasty.

## 2022-07-14 ENCOUNTER — Other Ambulatory Visit: Payer: Self-pay | Admitting: Internal Medicine

## 2022-08-01 ENCOUNTER — Encounter: Payer: Self-pay | Admitting: Physician Assistant

## 2022-08-01 ENCOUNTER — Ambulatory Visit: Payer: Medicare Other | Admitting: Physician Assistant

## 2022-08-01 DIAGNOSIS — M7062 Trochanteric bursitis, left hip: Secondary | ICD-10-CM

## 2022-08-01 MED ORDER — METHYLPREDNISOLONE ACETATE 40 MG/ML IJ SUSP
40.0000 mg | INTRAMUSCULAR | Status: AC | PRN
  Administered 2022-08-01: 40 mg via INTRA_ARTICULAR

## 2022-08-01 MED ORDER — LIDOCAINE HCL 1 % IJ SOLN
3.0000 mL | INTRAMUSCULAR | Status: AC | PRN
  Administered 2022-08-01: 3 mL

## 2022-08-01 NOTE — Progress Notes (Signed)
Office Visit Note   Patient: Kathleen Greer           Date of Birth: 1946-04-06           MRN: [id number] Visit Date: 08/01/2022              Requested by: Margaree Mackintosh, MD [address] [place],  [place] [id number] PCP: Margaree Mackintosh, MD   Assessment & Plan: Visit Diagnoses:  1. Trochanteric bursitis, left hip     Plan: She will stop her abduction exercises of the hip.  She is shown IT band stretching exercises for the left hip.  She will continue work on range of motion and strengthening of the left knee.  Follow-up with [place] in a year postop AP and lateral views of the left knee at that time.  Questions were encouraged and answered at length today.  Follow-Up Instructions: Return in about 4 months (around 12/01/2022).   Orders:  Orders Placed This Encounter  Procedures   Large Joint Inj   No orders of the defined types were placed in this encounter.     Procedures: Large Joint Inj: L greater trochanter on 08/01/2022 9:22 AM Details: 22 G 1.5 in needle, lateral approach  Arthrogram: No  Medications: 3 mL lidocaine 1 %; 40 mg methylPREDNISolone acetate 40 MG/ML Outcome: tolerated well, no immediate complications Procedure, treatment alternatives, risks and benefits explained, specific risks discussed. Consent was given by the patient. Immediately prior to procedure a time out was called to verify the correct patient, procedure, equipment, support staff and site/side marked as required. Patient was prepped and draped in the usual sterile fashion.       Clinical Data: No additional findings.   Subjective: Chief Complaint  Patient presents with   Left Knee - Routine Post Op    HPI Kathleen Greer comes in today for follow-up status post left total knee arthroplasty 12/07/2021.  She feels like the knee is doing okay some days sore particularly after activities.  Her main complaint today though is left lateral hip pain.  She is having no radicular symptoms down the  leg having pain in the lateral aspect of the hip with ambulation also lying on the hip at night.  She is diabetic with a latest hemoglobin A1c being 6.2.  Review of Systems  Constitutional:  Negative for chills and fever.     Objective: Vital Signs: There were no vitals taken for this visit.  Physical Exam Constitutional:      Appearance: She is not ill-appearing or diaphoretic.  Pulmonary:     Effort: Pulmonary effort is normal.  Neurological:     Mental Status: She is alert and oriented to person, place, and time.  Psychiatric:        Mood and Affect: Mood normal.     Ortho Exam Left knee full extension flexion 105 degrees.  Calf supple nontender no instability valgus varus stressing.  Surgical incisions healing well slight keloid proximal incision. Left hip good range of motion without pain.  Tenderness over the trochanteric region. Specialty Comments:  No specialty comments available.  Imaging: No results found.   PMFS History: Patient Active Problem List   Diagnosis Date Noted   Status post total left knee replacement 12/07/2021   Status post total right knee replacement 02/18/2021   Unilateral primary osteoarthritis, left knee 06/17/2020   Unilateral primary osteoarthritis, right knee 06/17/2020   Osteoarthritis of right knee 09/15/2017   Epistaxis 07/12/2016   Impaired glucose  tolerance 09/04/2015   Metabolic syndrome 08/08/2014   Anxiety 06/02/2011   Hypertension 01/12/2011   GE reflux 01/12/2011   Dependent edema 01/12/2011   Benign positional vertigo 01/12/2011   Hx of adenomatous colonic polyps 01/12/2011   Past Medical History:  Diagnosis Date   Arthritis    Blood transfusion without reported diagnosis 04/18/1968   after hysterectomy   Dependent edema    Diabetes    type 2   Generalized headaches    GERD (gastroesophageal reflux disease)    Hyperkalemia    Hypertension    IBS (irritable bowel syndrome)    Positional vertigo    Scoliosis      Family History  Problem Relation Age of Onset   Heart disease Mother    Stroke Mother    Hypertension Mother    Heart disease Father    Breast cancer Sister    Heart disease Brother    Hypertension Son    Prostate cancer Son    Colon cancer Neg Hx     Past Surgical History:  Procedure Laterality Date   ABDOMINAL HYSTERECTOMY  1970   CARPAL TUNNEL RELEASE Right 2003   KNEE ARTHROSCOPY Right 2010   ROTATOR CUFF REPAIR Right 6/02, 3/03   TOTAL KNEE ARTHROPLASTY Right 02/18/2021   Procedure: RIGHT TOTAL KNEE ARTHROPLASTY;  Surgeon: Kathryne Hitch, MD;  Location: [facility] OR;  Service: Orthopedics;  Laterality: Right;   TOTAL KNEE ARTHROPLASTY Left 12/07/2021   Procedure: LEFT TOTAL KNEE ARTHROPLASTY;  Surgeon: Kathryne Hitch, MD;  Location: [facility] OR;  Service: Orthopedics;  Laterality: Left;   Social History   Occupational History   Not on file  Tobacco Use   Smoking status: Never   Smokeless tobacco: Never  Vaping Use   Vaping Use: Never used  Substance and Sexual Activity   Alcohol use: No   Drug use: No   Sexual activity: Not on file

## 2022-09-02 NOTE — Progress Notes (Signed)
Patient Care Team: Margaree Mackintosh, MD as PCP - General (Internal Medicine)  Visit Date: 09/19/22  Subjective:    Patient ID: Kathleen Greer , Female   DOB: [birth year], 77 y.o.    MRN: [id number]   77 y.o. Female presents today for a 3 month follow-up.  Mild elevation of AST at 39, ALT at 62 on 06/13/22. Fatty liver suspected. Does not consume alcohol. Liver functions normal 09/16/22.  History of hypertension treated with amlodipine 10 mg daily, losartan 100 mg daily. Blood pressure elevated today at 142/82.  Still having pain in left knee, reduced mobility. Status post total left knee replacement. Gabapentin is helping. She is no longer going to PT. Recently received injection for left hip trochanteric bursitis. Follow-up with orthopedist 12/05/22. Her left hip is feeling better. Denies falls.  Reminded of eye exam.  Past Medical History:  Diagnosis Date   Arthritis    Blood transfusion without reported diagnosis 04/18/1968   after hysterectomy   Dependent edema    Diabetes (HCC)    type 2   Generalized headaches    GERD (gastroesophageal reflux disease)    Hyperkalemia    Hypertension    IBS (irritable bowel syndrome)    Positional vertigo    Scoliosis      Family History  Problem Relation Age of Onset   Heart disease Mother    Stroke Mother    Hypertension Mother    Heart disease Father    Breast cancer Sister    Heart disease Brother    Hypertension Son    Prostate cancer Son    Colon cancer Neg Hx     Social History   Social History Narrative   Social history: Patient is retired and formerly worked at [employer] as a Location manager. She does not smoke or consume alcohol.      Family history: Adult son with hypertension. Patient's father died with an MI. Mother died of an MI. 1 brother died with heart failure. Another brother died in 09/27/2004 with cirrhosis of the liver. Sister died at age 63. Another sister whose health is apparently unchanged      Review of  Systems  Constitutional:  Negative for fever and malaise/fatigue.  HENT:  Negative for congestion.   Eyes:  Negative for blurred vision.  Respiratory:  Negative for cough and shortness of breath.   Cardiovascular:  Negative for chest pain, palpitations and leg swelling.  Gastrointestinal:  Negative for vomiting.  Musculoskeletal:  Positive for joint pain (Left knee). Negative for back pain.  Skin:  Negative for rash.  Neurological:  Negative for loss of consciousness and headaches.        Objective:   Vitals: BP (!) 142/82   Pulse 60   Temp (!) 97 F (36.1 C) (Tympanic)   Resp 16   Ht 5' 3.75" (1.619 m)   Wt 171 lb 8 oz (77.8 kg)   SpO2 97%   BMI 29.67 kg/m    Physical Exam Vitals and nursing note reviewed.  Constitutional:      General: She is not in acute distress.    Appearance: Normal appearance. She is not toxic-appearing.  HENT:     Head: Normocephalic and atraumatic.  Neck:     Thyroid: No thyroid mass, thyromegaly or thyroid tenderness.     Vascular: No carotid bruit.  Cardiovascular:     Rate and Rhythm: Normal rate and regular rhythm. No extrasystoles are present.    Pulses: Normal pulses.  Heart sounds: Normal heart sounds. No murmur heard.    No friction rub. No gallop.     Comments: Trace pitting edema bilaterally. Pulmonary:     Effort: Pulmonary effort is normal. No respiratory distress.     Breath sounds: Normal breath sounds. No wheezing or rales.  Lymphadenopathy:     Cervical: No cervical adenopathy.  Skin:    General: Skin is warm and dry.  Neurological:     Mental Status: She is alert and oriented to person, place, and time. Mental status is at baseline.  Psychiatric:        Mood and Affect: Mood normal.        Behavior: Behavior normal.        Thought Content: Thought content normal.        Judgment: Judgment normal.     Has swelling left knee but no erythema or increased warmth. Well healed surgical incision  Results:   Studies  obtained and personally reviewed by me:   Labs: Liver functions which were mildly elevated at last visit are now normal.      Component Value Date/Time   NA 142 06/13/2022 1049   K 4.4 06/13/2022 1049   CL 104 06/13/2022 1049   CO2 31 06/13/2022 1049   GLUCOSE 138 (H) 06/13/2022 1049   BUN 17 06/13/2022 1049   CREATININE 0.89 06/13/2022 1049   CALCIUM 9.4 06/13/2022 1049   PROT 7.7 09/16/2022 0909   ALBUMIN 4.4 02/25/2016 1133   AST 18 09/16/2022 0909   ALT 26 09/16/2022 0909   ALKPHOS 74 02/25/2016 1133   BILITOT 0.7 09/16/2022 0909   GFRNONAA 49 (L) 12/08/2021 0626   GFRNONAA 58 (L) 05/01/2020 0916   GFRAA 67 05/01/2020 0916     Lab Results  Component Value Date   WBC 6.2 06/13/2022   HGB 13.1 06/13/2022   HCT 39.0 06/13/2022   MCV 82.1 06/13/2022   PLT 275 06/13/2022    Lab Results  Component Value Date   CHOL 160 06/13/2022   HDL 62 06/13/2022   LDLCALC 81 06/13/2022   TRIG 89 06/13/2022   CHOLHDL 2.6 06/13/2022    Lab Results  Component Value Date   HGBA1C 6.2 (H) 06/13/2022     Lab Results  Component Value Date   TSH 1.81 06/13/2022      Assessment & Plan:   Mild elevation of AST/ALT: AST at 39, ALT at 62 on 06/13/22. Fatty liver suspected. Liver functions  are nownormal as of 09/16/22.  Hypertension: treated with amlodipine 10 mg daily, losartan 100 mg daily. Blood pressure elevated today at 142/82. Recheck BP 138/82 seems to be due to pain and anxiety about knee. Pt to continue to monitor BP at home and call if persistently elevated.  Left knee pain: Gabapentin is helping but still has significant pain. Will refer to orthopedist for earlier follow-up. Have sent in 5 day Rx of Norco 5/325 to take sparingly for knee pain.  Reminded of eye exam. Advised to check blood sugar several times weekly.  RTC in October.  I,Alexander Ruley,acting as a Neurosurgeon for Margaree Mackintosh, MD.,have documented all relevant documentation on the behalf of Margaree Mackintosh,  MD,as directed by  Margaree Mackintosh, MD while in the presence of Margaree Mackintosh, MD.   I, Margaree Mackintosh, MD, have reviewed all documentation for this visit. The documentation on 09/19/22 for the exam, diagnosis, procedures, and orders are all accurate and complete.

## 2022-09-16 ENCOUNTER — Other Ambulatory Visit: Payer: Medicare Other

## 2022-09-16 DIAGNOSIS — R7989 Other specified abnormal findings of blood chemistry: Secondary | ICD-10-CM

## 2022-09-17 LAB — HEPATIC FUNCTION PANEL
AG Ratio: 1.5 (calc) (ref 1.0–2.5)
ALT: 26 U/L (ref 6–29)
AST: 18 U/L (ref 10–35)
Albumin: 4.6 g/dL (ref 3.6–5.1)
Alkaline phosphatase (APISO): 111 U/L (ref 37–153)
Bilirubin, Direct: 0.1 mg/dL (ref 0.0–0.2)
Globulin: 3.1 g/dL (calc) (ref 1.9–3.7)
Indirect Bilirubin: 0.6 mg/dL (calc) (ref 0.2–1.2)
Total Bilirubin: 0.7 mg/dL (ref 0.2–1.2)
Total Protein: 7.7 g/dL (ref 6.1–8.1)

## 2022-09-19 ENCOUNTER — Encounter: Payer: Self-pay | Admitting: Internal Medicine

## 2022-09-19 ENCOUNTER — Ambulatory Visit (INDEPENDENT_AMBULATORY_CARE_PROVIDER_SITE_OTHER): Payer: Medicare Other | Admitting: Internal Medicine

## 2022-09-19 VITALS — BP 138/82 | HR 60 | Temp 97.0°F | Resp 16 | Ht 63.75 in | Wt 171.5 lb

## 2022-09-19 DIAGNOSIS — R7989 Other specified abnormal findings of blood chemistry: Secondary | ICD-10-CM

## 2022-09-19 DIAGNOSIS — Z96652 Presence of left artificial knee joint: Secondary | ICD-10-CM

## 2022-09-19 DIAGNOSIS — G8929 Other chronic pain: Secondary | ICD-10-CM | POA: Diagnosis not present

## 2022-09-19 DIAGNOSIS — Z96651 Presence of right artificial knee joint: Secondary | ICD-10-CM | POA: Diagnosis not present

## 2022-09-19 DIAGNOSIS — M25562 Pain in left knee: Secondary | ICD-10-CM

## 2022-09-19 NOTE — Patient Instructions (Addendum)
Given 5 day supply of Norco 5/325 to take sparingly for knee pain. Referral back to ortho for advice on management. Has follow up there in August for one year recheck but having significant pain now. Does not want to go back to PT but agrees to see Ortho. Not able to do a lot of yard work due to knee pain.  Liver functions are now normal. Were mildly elevated at last visit. RTC in October.Will have labs done then including lipid and liver panels

## 2022-09-22 ENCOUNTER — Encounter: Payer: Self-pay | Admitting: Orthopaedic Surgery

## 2022-09-22 ENCOUNTER — Other Ambulatory Visit (INDEPENDENT_AMBULATORY_CARE_PROVIDER_SITE_OTHER): Payer: Medicare Other

## 2022-09-22 ENCOUNTER — Ambulatory Visit: Payer: Medicare Other | Admitting: Orthopaedic Surgery

## 2022-09-22 DIAGNOSIS — M24662 Ankylosis, left knee: Secondary | ICD-10-CM | POA: Diagnosis not present

## 2022-09-22 DIAGNOSIS — Z96652 Presence of left artificial knee joint: Secondary | ICD-10-CM

## 2022-09-22 NOTE — Progress Notes (Signed)
The patient is now about 10 months status post a left total knee arthroplasty.  We replaced her right knee several months before that.  She recently had a steroid injection of her left hip trochanteric area and that is helped some.  She still has significant limitations in range of motion of her left knee compared to her right total knee.  On my exam today her extension is almost full but her flexion is still to only just past 90 degrees to maybe 95 degrees.  The other knee flexes further back.  We talked about the possibility of an open arthrotomy with lysis of adhesions and a polyliner exchange.  She would like to try at least some outpatient physical therapy first and I agree with this.  We did x-ray her knee today and she has bilateral total knee arthroplasties and there is no complicating features I can see the arthroplasties at all.  Will see her back in 2 months to see how she is doing after therapy on her left knee.  No x-rays are needed at that visit.

## 2022-09-23 ENCOUNTER — Other Ambulatory Visit: Payer: Self-pay | Admitting: Internal Medicine

## 2022-09-27 ENCOUNTER — Telehealth: Payer: Self-pay | Admitting: Orthopaedic Surgery

## 2022-09-27 NOTE — Telephone Encounter (Signed)
Faxed 09/22/22 progress note to [facility] [phone]

## 2022-10-11 DIAGNOSIS — M25562 Pain in left knee: Secondary | ICD-10-CM | POA: Diagnosis not present

## 2022-10-11 DIAGNOSIS — Z96652 Presence of left artificial knee joint: Secondary | ICD-10-CM | POA: Diagnosis not present

## 2022-10-17 DIAGNOSIS — Z96652 Presence of left artificial knee joint: Secondary | ICD-10-CM | POA: Diagnosis not present

## 2022-10-17 DIAGNOSIS — M25562 Pain in left knee: Secondary | ICD-10-CM | POA: Diagnosis not present

## 2022-10-24 DIAGNOSIS — Z96652 Presence of left artificial knee joint: Secondary | ICD-10-CM | POA: Diagnosis not present

## 2022-10-24 DIAGNOSIS — M25562 Pain in left knee: Secondary | ICD-10-CM | POA: Diagnosis not present

## 2022-10-26 ENCOUNTER — Other Ambulatory Visit: Payer: Self-pay | Admitting: Internal Medicine

## 2022-10-31 DIAGNOSIS — Z96652 Presence of left artificial knee joint: Secondary | ICD-10-CM | POA: Diagnosis not present

## 2022-10-31 DIAGNOSIS — M25562 Pain in left knee: Secondary | ICD-10-CM | POA: Diagnosis not present

## 2022-11-07 DIAGNOSIS — Z96652 Presence of left artificial knee joint: Secondary | ICD-10-CM | POA: Diagnosis not present

## 2022-11-07 DIAGNOSIS — M25562 Pain in left knee: Secondary | ICD-10-CM | POA: Diagnosis not present

## 2022-11-14 DIAGNOSIS — Z96652 Presence of left artificial knee joint: Secondary | ICD-10-CM | POA: Diagnosis not present

## 2022-11-14 DIAGNOSIS — M25562 Pain in left knee: Secondary | ICD-10-CM | POA: Diagnosis not present

## 2022-11-21 DIAGNOSIS — Z96652 Presence of left artificial knee joint: Secondary | ICD-10-CM | POA: Diagnosis not present

## 2022-11-21 DIAGNOSIS — M25562 Pain in left knee: Secondary | ICD-10-CM | POA: Diagnosis not present

## 2022-11-28 ENCOUNTER — Encounter: Payer: Self-pay | Admitting: Orthopaedic Surgery

## 2022-11-28 ENCOUNTER — Ambulatory Visit: Payer: Medicare Other | Admitting: Orthopaedic Surgery

## 2022-11-28 ENCOUNTER — Telehealth: Payer: Self-pay | Admitting: *Deleted

## 2022-11-28 DIAGNOSIS — M24662 Ankylosis, left knee: Secondary | ICD-10-CM | POA: Diagnosis not present

## 2022-11-28 DIAGNOSIS — Z96651 Presence of right artificial knee joint: Secondary | ICD-10-CM | POA: Diagnosis not present

## 2022-11-28 DIAGNOSIS — Z96652 Presence of left artificial knee joint: Secondary | ICD-10-CM

## 2022-11-28 NOTE — Progress Notes (Signed)
The patient is a 77 year old female who I have replaced both of her knees.  Her right knee was replaced in November 2022 and her left knee a year ago in August.  The right knee has done well.  The left knee has not done as well in terms of developing postoperative arthrofibrosis.  We have manipulated that left knee under anesthesia after the initial surgery and course of physical therapy.  We were able to flex her knee past 90 degrees but then after that surgery she digressed.  She is still not able to flex her left knee well.  Examination of both knees shows no significant effusion.  She still lacks significant flexion of the left knee.  Feels stable otherwise.  I did x-ray her left knee recently and it shows no complicating features the hardware.  At this point one option would be considering an open arthrotomy with lysis of adhesions and a polyliner exchange.  However, I would like to send her to one of my partners Dr. Steward Drone for his opinion as to whether or not she would be a candidate for an arthroscopic lysis of adhesions.  She agrees with his referral.  Will work on making this happen.  All questions and concerns were addressed and answered.

## 2022-11-28 NOTE — Telephone Encounter (Signed)
Ortho bundle 1 year in office visit for left TKA.

## 2022-11-29 DIAGNOSIS — M25562 Pain in left knee: Secondary | ICD-10-CM | POA: Diagnosis not present

## 2022-11-29 DIAGNOSIS — Z96652 Presence of left artificial knee joint: Secondary | ICD-10-CM | POA: Diagnosis not present

## 2022-12-05 ENCOUNTER — Ambulatory Visit: Payer: Medicare Other | Admitting: Physician Assistant

## 2022-12-05 DIAGNOSIS — Z96652 Presence of left artificial knee joint: Secondary | ICD-10-CM | POA: Diagnosis not present

## 2022-12-05 DIAGNOSIS — M25562 Pain in left knee: Secondary | ICD-10-CM | POA: Diagnosis not present

## 2022-12-08 DIAGNOSIS — R051 Acute cough: Secondary | ICD-10-CM | POA: Diagnosis not present

## 2022-12-08 DIAGNOSIS — R509 Fever, unspecified: Secondary | ICD-10-CM | POA: Diagnosis not present

## 2022-12-08 DIAGNOSIS — R0981 Nasal congestion: Secondary | ICD-10-CM | POA: Diagnosis not present

## 2022-12-12 ENCOUNTER — Ambulatory Visit (HOSPITAL_BASED_OUTPATIENT_CLINIC_OR_DEPARTMENT_OTHER): Payer: Medicare Other | Admitting: Orthopaedic Surgery

## 2022-12-15 DIAGNOSIS — Z96652 Presence of left artificial knee joint: Secondary | ICD-10-CM | POA: Diagnosis not present

## 2022-12-27 ENCOUNTER — Other Ambulatory Visit: Payer: Self-pay | Admitting: Internal Medicine

## 2022-12-27 DIAGNOSIS — R7302 Impaired glucose tolerance (oral): Secondary | ICD-10-CM

## 2022-12-30 ENCOUNTER — Ambulatory Visit (HOSPITAL_BASED_OUTPATIENT_CLINIC_OR_DEPARTMENT_OTHER): Payer: Self-pay | Admitting: Orthopaedic Surgery

## 2022-12-30 ENCOUNTER — Ambulatory Visit (HOSPITAL_BASED_OUTPATIENT_CLINIC_OR_DEPARTMENT_OTHER): Payer: Medicare Other | Admitting: Orthopaedic Surgery

## 2022-12-30 ENCOUNTER — Other Ambulatory Visit (HOSPITAL_BASED_OUTPATIENT_CLINIC_OR_DEPARTMENT_OTHER): Payer: Self-pay

## 2022-12-30 DIAGNOSIS — Z96652 Presence of left artificial knee joint: Secondary | ICD-10-CM | POA: Diagnosis not present

## 2022-12-30 MED ORDER — ACETAMINOPHEN 500 MG PO TABS
500.0000 mg | ORAL_TABLET | Freq: Three times a day (TID) | ORAL | 0 refills | Status: AC
  Filled 2022-12-30: qty 30, 10d supply, fill #0

## 2022-12-30 MED ORDER — OXYCODONE HCL 5 MG PO TABS
5.0000 mg | ORAL_TABLET | ORAL | 0 refills | Status: DC | PRN
  Filled 2022-12-30: qty 15, 3d supply, fill #0

## 2022-12-30 MED ORDER — ASPIRIN 325 MG PO TBEC
325.0000 mg | DELAYED_RELEASE_TABLET | Freq: Every day | ORAL | 0 refills | Status: DC
  Filled 2022-12-30: qty 30, 30d supply, fill #0

## 2022-12-30 NOTE — Progress Notes (Signed)
Chief Complaint: Left knee pain     History of Present Illness:    Kathleen Greer is a 77 y.o. female presents today with ongoing left knee stiffness and swelling in the setting of previous total knee arthroplasty done by Dr. Magnus Ivan in August 2023.  There is not been any concern for infection.  She does have loss of flexion in the side.  She does have quite good extension.  She has been extensively working in physical therapy the last several months without any relief.  She subsequently has plateaued.  She is here today for further discussion and assessment.  She is here today with her husband    Surgical History:   As above  PMH/PSH/Family History/Social History/Meds/Allergies:    Past Medical History:  Diagnosis Date   Arthritis    Blood transfusion without reported diagnosis 04/18/1968   after hysterectomy   Dependent edema    Diabetes (HCC)    type 2   Generalized headaches    GERD (gastroesophageal reflux disease)    Hyperkalemia    Hypertension    IBS (irritable bowel syndrome)    Positional vertigo    Scoliosis    Past Surgical History:  Procedure Laterality Date   ABDOMINAL HYSTERECTOMY  1970   CARPAL TUNNEL RELEASE Right 2002/01/24   KNEE ARTHROSCOPY Right 01-24-09   ROTATOR CUFF REPAIR Right 6/02, 3/03   TOTAL KNEE ARTHROPLASTY Right 02/18/2021   Procedure: RIGHT TOTAL KNEE ARTHROPLASTY;  Surgeon: Kathryne Hitch, MD;  Location: [facility] OR;  Service: Orthopedics;  Laterality: Right;   TOTAL KNEE ARTHROPLASTY Left 12/07/2021   Procedure: LEFT TOTAL KNEE ARTHROPLASTY;  Surgeon: Kathryne Hitch, MD;  Location: [facility] OR;  Service: Orthopedics;  Laterality: Left;   Social History   Socioeconomic History   Marital status: Divorced    Spouse name: Not on file   Number of children: 1   Years of education: Not on file   Highest education level: Not on file  Occupational History   Not on file  Tobacco Use   Smoking status: Never    Smokeless tobacco: Never  Vaping Use   Vaping status: Never Used  Substance and Sexual Activity   Alcohol use: No   Drug use: No   Sexual activity: Not on file  Other Topics Concern   Not on file  Social History Narrative   Social history: Patient is retired and formerly worked at [employer] as a Location manager. She does not smoke or consume alcohol.      Family history: Adult son with hypertension. Patient's father died with an MI. Mother died of an MI. 1 brother died with heart failure. Another brother died in 01-24-05 with cirrhosis of the liver. Sister died at age 27. Another sister whose health is apparently unchanged   Social Determinants of Health   Financial Resource Strain: Not on file  Food Insecurity: Not on file  Transportation Needs: Not on file  Physical Activity: Not on file  Stress: Not on file  Social Connections: Not on file   Family History  Problem Relation Age of Onset   Heart disease Mother    Stroke Mother    Hypertension Mother    Heart disease Father    Breast cancer Sister    Heart disease Brother    Hypertension Son  Prostate cancer Son    Colon cancer Neg Hx    Allergies  Allergen Reactions   Penicillins     Skin irritation   Current Outpatient Medications  Medication Sig Dispense Refill   acetaminophen (TYLENOL) 500 MG tablet Take 1 tablet (500 mg total) by mouth every 8 (eight) hours for 10 days. 30 tablet 0   aspirin EC 325 MG tablet Take 1 tablet (325 mg total) by mouth daily. 30 tablet 0   oxyCODONE (ROXICODONE) 5 MG immediate release tablet Take 1 tablet (5 mg total) by mouth every 4 (four) hours as needed for severe pain or breakthrough pain. 15 tablet 0   amLODipine (NORVASC) 10 MG tablet TAKE 1 TABLET BY MOUTH DAILY 100 tablet 2   aspirin 81 MG chewable tablet Chew 1 tablet (81 mg total) by mouth 2 (two) times daily. 30 tablet 0   Blood Glucose Monitoring Suppl (ONE TOUCH ULTRA 2) w/Device KIT      Cholecalciferol (VITAMIN D) 50  MCG (2000 UT) tablet Take 2,000 Units by mouth in the morning.     diclofenac (VOLTAREN) 75 MG EC tablet Take 1 tablet (75 mg total) by mouth 2 (two) times daily between meals as needed. 60 tablet 3   gabapentin (NEURONTIN) 300 MG capsule Take 300 mg by mouth at bedtime.     glipiZIDE (GLUCOTROL) 5 MG tablet TAKE 1 TABLET BY MOUTH  BEFORE BREAKFAST AND SUPPER 180 tablet 3   losartan (COZAAR) 100 MG tablet TAKE 1 TABLET BY MOUTH DAILY 100 tablet 2   Menthol, Topical Analgesic, (ICY HOT EX) Apply 1 Application topically daily as needed (knee pain).     metFORMIN (GLUCOPHAGE) 1000 MG tablet TAKE 1 TABLET BY MOUTH DAILY  WITH BREAKFAST 100 tablet 2   omeprazole (PRILOSEC) 20 MG capsule TAKE 1 CAPSULE BY MOUTH DAILY 100 capsule 2   ondansetron (ZOFRAN) 4 MG tablet Take 1 tablet (4 mg total) by mouth every 8 (eight) hours as needed for nausea or vomiting. 20 tablet 0   ONE TOUCH ULTRA TEST test strip      ONETOUCH DELICA LANCETS FINE MISC      potassium chloride SA (KLOR-CON M) 20 MEQ tablet TAKE 1 TABLET BY MOUTH TWICE  DAILY 200 tablet 2   Pyridoxine HCl (B-6 PO) Take by mouth.     rosuvastatin (CRESTOR) 5 MG tablet TAKE 1 TABLET BY MOUTH DAILY 100 tablet 2   sodium chloride (OCEAN) 0.65 % SOLN nasal spray Place 1 spray into both nostrils as needed for congestion.     Tetrahydrozoline HCl (VISINE OP) Place 1 drop into both eyes daily as needed (irritation).     torsemide (DEMADEX) 20 MG tablet Take 1 tablet (20 mg total) by mouth in the morning. 90 tablet 3   No current facility-administered medications for this visit.   No results found.  Review of Systems:   A ROS was performed including pertinent positives and negatives as documented in the HPI.  Physical Exam :   Constitutional: NAD and appears stated age Neurological: Alert and oriented Psych: Appropriate affect and cooperative There were no vitals taken for this visit.   Comprehensive Musculoskeletal Exam:    Left knee incision is  well-appearing without erythema or drainage.  Range of motion is from full extension to approximately 85 degrees of flexion.  Minimal to no swelling about the knee.  No redness about the knee  Imaging:   Xray (3 views left knee): Status post total knee arthroplasty  without evidence of complication   I personally reviewed and interpreted the radiographs.   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  Given the fact that she has had limited improvement at this time I did discuss that it is likely that the scar tissue in her suprapatellar pouch has matured.  Given that we did discuss the possibility of knee arthroscopy with lysis of adhesions and manipulation under anesthesia.  I did specifically discuss all the risks of limitations as well as the rehab.  Associated with this.  I would like her to begin physical therapy following this.  After discussion she has elected for left knee arthroscopy with lysis of adhesions and manipulation under anesthesia  Plan :    - Plan for left knee arthroscopy with lysis of adhesions and manipulation under anesthesia    After a lengthy discussion of treatment options, including risks, benefits, alternatives, complications of surgical and nonsurgical conservative options, the patient elected surgical repair.   The patient  is aware of the material risks  and complications including, but not limited to injury to adjacent structures, neurovascular injury, infection, numbness, bleeding, implant failure, thermal burns, stiffness, persistent pain, failure to heal, disease transmission from allograft, need for further surgery, dislocation, anesthetic risks, blood clots, risks of death,and others. The probabilities of surgical success and failure discussed with patient given their particular co-morbidities.The time and nature of expected rehabilitation and recovery was discussed.The  patient's questions were all answered preoperatively.  No barriers to understanding were noted. I explained the natural history of the disease process and Rx rationale.  I explained to the patient what I considered to be reasonable expectations given their personal situation.  The final treatment plan was arrived at through a shared patient decision making process model.      I personally saw and evaluated the patient, and participated in the management and treatment plan.  Huel Cote, MD Attending Physician, Orthopedic Surgery  This document was dictated using Dragon voice recognition software. A reasonable attempt at proof reading has been made to minimize errors.

## 2023-01-03 ENCOUNTER — Telehealth: Payer: Self-pay | Admitting: Internal Medicine

## 2023-01-03 NOTE — Telephone Encounter (Addendum)
Received Surgery Clearance Form from April Beavers at [facility]   Dr Huel Cote  General Anesthesia  Left Knee Arthroscopy  with Lysis of Adhesions and manipulations under anesthesia.   Office Visit schedule for 01/19/2023

## 2023-01-16 ENCOUNTER — Other Ambulatory Visit: Payer: Medicare Other

## 2023-01-16 DIAGNOSIS — E1169 Type 2 diabetes mellitus with other specified complication: Secondary | ICD-10-CM | POA: Diagnosis not present

## 2023-01-16 DIAGNOSIS — E785 Hyperlipidemia, unspecified: Secondary | ICD-10-CM | POA: Diagnosis not present

## 2023-01-17 LAB — LIPID PANEL
Cholesterol: 144 mg/dL (ref <200)
HDL: 56 mg/dL (ref >=50)
LDL Cholesterol (Calc): 73 mg/dL
Non-HDL Cholesterol (Calc): 88 mg/dL (ref <130)
Total CHOL/HDL Ratio: 2.6 (calc) (ref <5.0)
Triglycerides: 70 mg/dL (ref <150)

## 2023-01-17 LAB — HEMOGLOBIN A1C
Hgb A1c MFr Bld: 6.8 %{Hb} — ABNORMAL HIGH (ref <5.7)
Mean Plasma Glucose: 148 mg/dL
eAG (mmol/L): 8.2 mmol/L

## 2023-01-19 ENCOUNTER — Ambulatory Visit (INDEPENDENT_AMBULATORY_CARE_PROVIDER_SITE_OTHER): Payer: Medicare Other | Admitting: Internal Medicine

## 2023-01-19 ENCOUNTER — Encounter: Payer: Self-pay | Admitting: Internal Medicine

## 2023-01-19 VITALS — BP 160/80 | HR 69 | Ht 63.75 in | Wt 178.0 lb

## 2023-01-19 DIAGNOSIS — G8929 Other chronic pain: Secondary | ICD-10-CM | POA: Diagnosis not present

## 2023-01-19 DIAGNOSIS — Z01811 Encounter for preprocedural respiratory examination: Secondary | ICD-10-CM | POA: Diagnosis not present

## 2023-01-19 DIAGNOSIS — K219 Gastro-esophageal reflux disease without esophagitis: Secondary | ICD-10-CM

## 2023-01-19 DIAGNOSIS — Z96652 Presence of left artificial knee joint: Secondary | ICD-10-CM

## 2023-01-19 DIAGNOSIS — M25562 Pain in left knee: Secondary | ICD-10-CM | POA: Diagnosis not present

## 2023-01-19 DIAGNOSIS — E1169 Type 2 diabetes mellitus with other specified complication: Secondary | ICD-10-CM | POA: Diagnosis not present

## 2023-01-19 DIAGNOSIS — Z6830 Body mass index (BMI) 30.0-30.9, adult: Secondary | ICD-10-CM

## 2023-01-19 DIAGNOSIS — Z01818 Encounter for other preprocedural examination: Secondary | ICD-10-CM | POA: Diagnosis not present

## 2023-01-19 DIAGNOSIS — Z96651 Presence of right artificial knee joint: Secondary | ICD-10-CM

## 2023-01-19 DIAGNOSIS — E785 Hyperlipidemia, unspecified: Secondary | ICD-10-CM | POA: Diagnosis not present

## 2023-01-19 DIAGNOSIS — I10 Essential (primary) hypertension: Secondary | ICD-10-CM

## 2023-01-19 DIAGNOSIS — R609 Edema, unspecified: Secondary | ICD-10-CM | POA: Diagnosis not present

## 2023-01-20 ENCOUNTER — Other Ambulatory Visit: Payer: Medicare Other

## 2023-01-20 IMAGING — DX DG KNEE 1-2V PORT*R*
1 series · 2 of 2 positions shown · non-contrast
Comparison: Right knee x-ray 06/17/2020

CLINICAL DATA: Status post knee replacement

EXAM:
PORTABLE RIGHT KNEE - 1-2 VIEW

[Series 1: knee · 0.14mm/px · 2 of 2 slices shown]
[im 1/2]
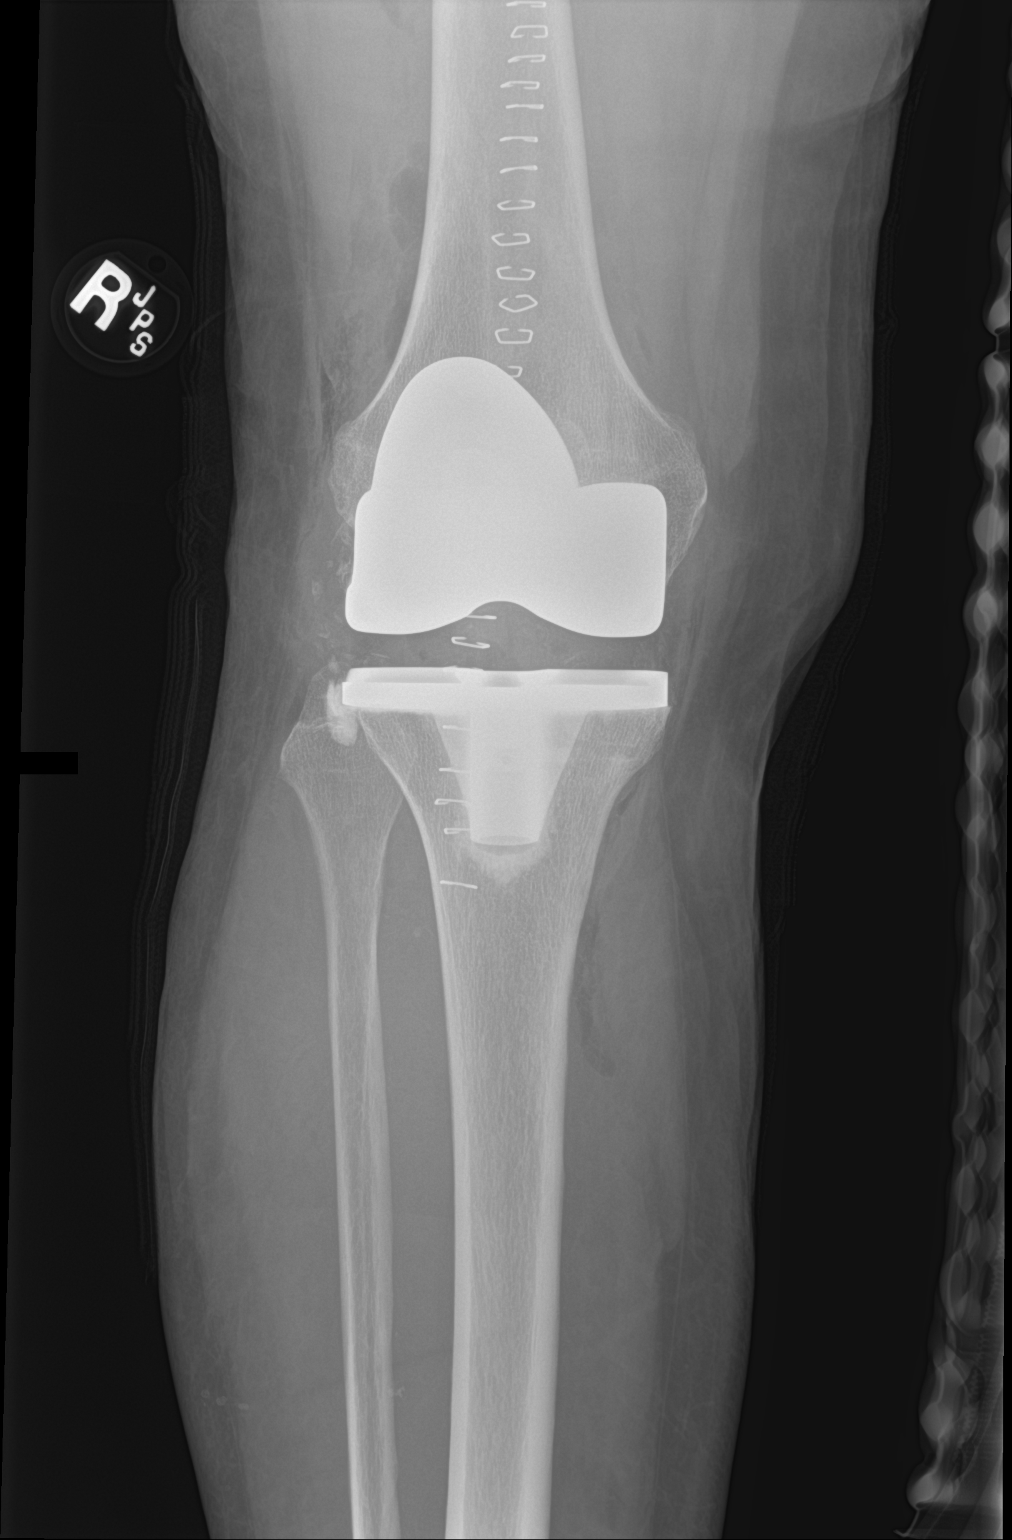
[im 2/2]
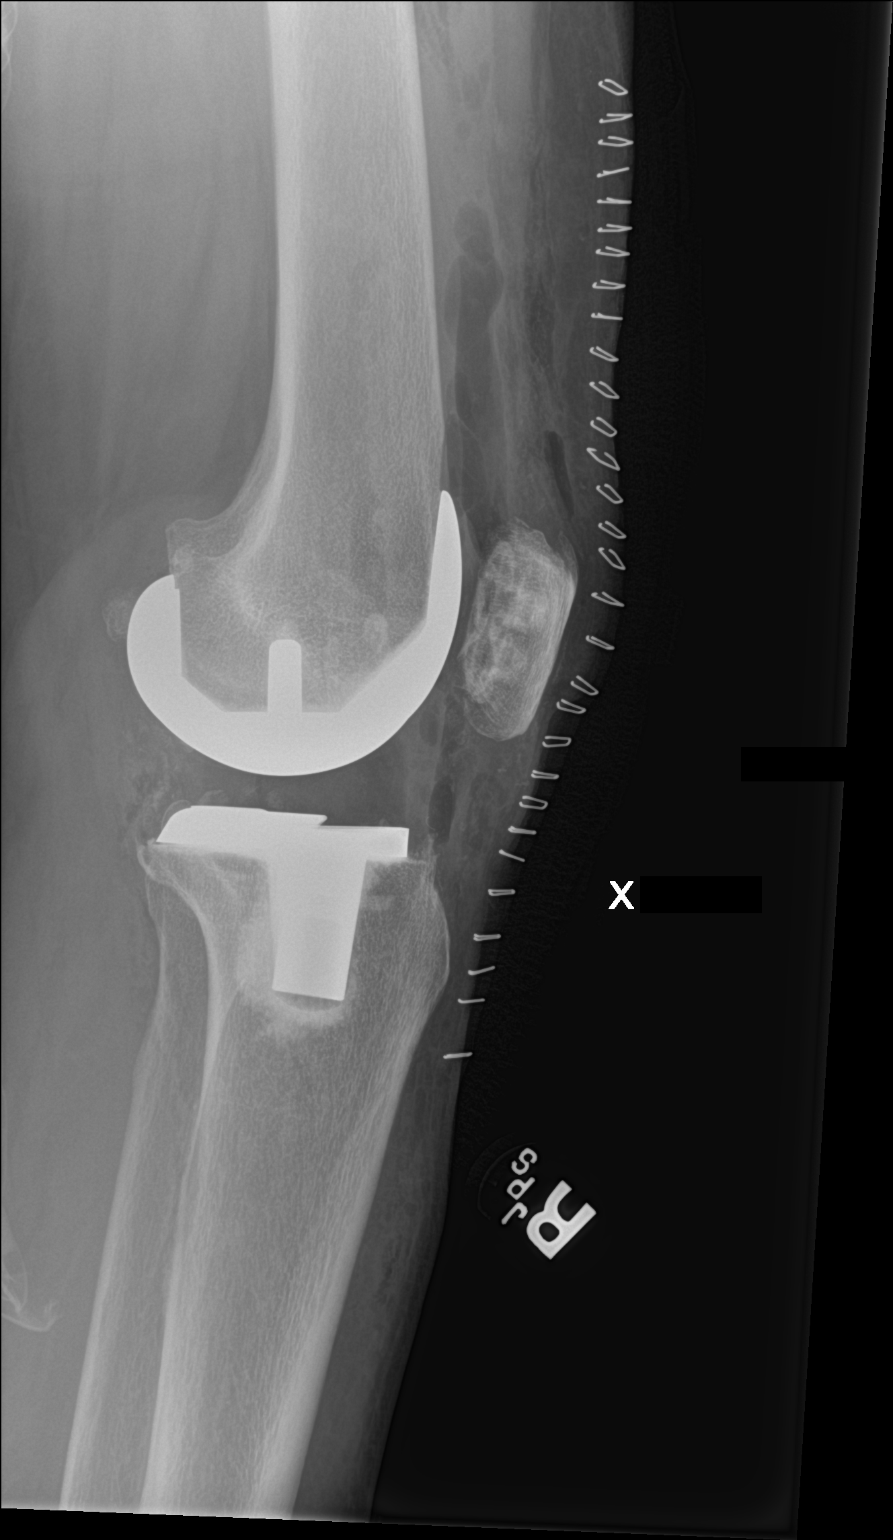

[2 of 2 positions shown; findings below may reference images not displayed]

FINDINGS: Status post total right knee arthroplasty. The hardware appears
aligned and intact. No acute fracture or dislocation identified.
Subcutaneous emphysema. Overlying surgical staples anteriorly.
IMPRESSION: Total right knee arthroplasty changes.

## 2023-01-20 NOTE — Patient Instructions (Addendum)
Pre-surgical labs to be ordered at Hospital. Lipid panel and liver functions are normal. Hgb AIC is elevated. Patient reports lack of exercise and physical activity due to knee pain. Will not change glipizide or metformin dose at this time. Pt should watch diet and we will see her again in march for Medicare wellness visit and exam with labs.

## 2023-01-23 ENCOUNTER — Ambulatory Visit: Payer: Medicare Other | Admitting: Internal Medicine

## 2023-01-26 DIAGNOSIS — E119 Type 2 diabetes mellitus without complications: Secondary | ICD-10-CM | POA: Diagnosis not present

## 2023-01-26 LAB — HM DIABETES EYE EXAM

## 2023-02-20 NOTE — Pre-Procedure Instructions (Addendum)
Surgical Instructions   Your procedure is scheduled on Monday, November 11th, 2024. Report to [facility] Main Entrance "A" at 1:55 P.M., then check in with the Admitting office. Any questions or running late day of surgery: call [phone]  Questions prior to your surgery date: call [phone], Monday-Friday, 8am-4pm. If you experience any cold or flu symptoms such as cough, fever, chills, shortness of breath, etc. between now and your scheduled surgery, please notify us at the above number.     Remember:  Do not eat after midnight the night before your surgery   You may drink clear liquids until 12:45 PM, the day of your surgery.   Clear liquids allowed are: Water, Non-Citrus Juices (without pulp), Carbonated Beverages, Clear Tea, Black Coffee Only (NO MILK, CREAM OR POWDERED CREAMER of any kind), and Gatorade.  Patient Instructions  The night before surgery:  No food after midnight. ONLY clear liquids after midnight   The day of surgery (if you have diabetes): Drink ONE (1) 12 oz G2 given to you in your pre admission testing appointment by 12:45 PM the day of surgery. Drink in one sitting. Do not sip.  This drink was given to you during your hospital  pre-op appointment visit.  Nothing else to drink after completing the  12 oz bottle of G2.         If you have questions, please contact your surgeon's office.     Take these medicines the morning of surgery with A SIP OF WATER: Amlodipine (Norvasc), Omeprazole (Prilosec),  Rosuvastatin (Crestor).   May take these medicines IF NEEDED: Ondansetron (Zofran), Tetrahydrozoline (Visine) eye drops    One week prior to surgery, STOP taking any Aspirin (unless otherwise instructed by your surgeon) Diclofenac, Aleve, Naproxen, Ibuprofen, Motrin, Advil, Goody's, BC's, all herbal medications, fish oil, and non-prescription vitamins.  If no Aspirin instructions were given, please reach out to your surgeon for further  instructions.   WHAT DO I DO ABOUT MY DIABETES MEDICATION?   Do not take oral diabetes medicines (pills) the morning of surgery.   The day before surgery, take morning dose of Glipizide and morning dose of Metformin.   Do NOT take evening dose of Glipizide the day before surgery.   Do NOT take any oral diabetes medicines (pills) the morning of surgery.    HOW TO MANAGE YOUR DIABETES BEFORE AND AFTER SURGERY  Why is it important to control my blood sugar before and after surgery? Improving blood sugar levels before and after surgery helps healing and can limit problems. A way of improving blood sugar control is eating a healthy diet by:  Eating less sugar and carbohydrates  Increasing activity/exercise  Talking with your doctor about reaching your blood sugar goals High blood sugars (greater than 180 mg/dL) can raise your risk of infections and slow your recovery, so you will need to focus on controlling your diabetes during the weeks before surgery. Make sure that the doctor who takes care of your diabetes knows about your planned surgery including the date and location.  How do I manage my blood sugar before surgery? Check your blood sugar at least 4 times a day, starting 2 days before surgery, to make sure that the level is not too high or low.  Check your blood sugar the morning of your surgery when you wake up and every 2 hours until you get to the Short Stay unit.  If your blood sugar is less than 70 mg/dL, you will need  to treat for low blood sugar: Treat a low blood sugar (less than 70 mg/dL) with  cup of clear juice (cranberry or apple), 4 glucose tablets, OR glucose gel. Recheck blood sugar in 15 minutes after treatment (to make sure it is greater than 70 mg/dL). If your blood sugar is not greater than 70 mg/dL on recheck, call [phone] for further instructions. Report your blood sugar to the short stay nurse when you get to Short Stay.  If you are admitted to the  hospital after surgery: Your blood sugar will be checked by the staff and you will probably be given insulin after surgery (instead of oral diabetes medicines) to make sure you have good blood sugar levels. The goal for blood sugar control after surgery is 80-180 mg/dL.                      Do NOT Smoke (Tobacco/Vaping) for 24 hours prior to your procedure.  If you use a CPAP at night, you may bring your mask/headgear for your overnight stay.   You will be asked to remove any contacts, glasses, piercing's, hearing aid's, dentures/partials prior to surgery. Please bring cases for these items if needed.    Patients discharged the day of surgery will not be allowed to drive home, and someone needs to stay with them for 24 hours.  SURGICAL WAITING ROOM VISITATION Patients may have no more than 2 support people in the waiting area - these visitors may rotate.   Pre-op nurse will coordinate an appropriate time for 1 ADULT support person, who may not rotate, to accompany patient in pre-op.  Children under the age of 72 must have an adult with them who is not the patient and must remain in the main waiting area with an adult.  If the patient needs to stay at the hospital during part of their recovery, the visitor guidelines for inpatient rooms apply.  Please refer to the [facility] website for the visitor guidelines for any additional information.   If you received a COVID test during your pre-op visit  it is requested that you wear a mask when out in public, stay away from anyone that may not be feeling well and notify your surgeon if you develop symptoms. If you have been in contact with anyone that has tested positive in the last 10 days please notify you surgeon.      Pre-operative CHG Bathing Instructions   You can play a key role in reducing the risk of infection after surgery. Your skin needs to be as free of germs as possible. You can reduce the number of germs on your skin by washing  with CHG (chlorhexidine gluconate) soap before surgery. CHG is an antiseptic soap that kills germs and continues to kill germs even after washing.   DO NOT use if you have an allergy to chlorhexidine/CHG or antibacterial soaps. If your skin becomes reddened or irritated, stop using the CHG and notify one of our RNs at [phone].              TAKE A SHOWER THE NIGHT BEFORE SURGERY AND THE DAY OF SURGERY    Please keep in mind the following:  DO NOT shave, including legs and underarms, 48 hours prior to surgery.   You may shave your face before/day of surgery.  Place clean sheets on your bed the night before surgery Use a clean washcloth (not used since being washed) for each shower. DO NOT sleep  with pet's night before surgery.  CHG Shower Instructions:  Wash your face and private area with normal soap. If you choose to wash your hair, wash first with your normal shampoo.  After you use shampoo/soap, rinse your hair and body thoroughly to remove shampoo/soap residue.  Turn the water OFF and apply half the bottle of CHG soap to a CLEAN washcloth.  Apply CHG soap ONLY FROM YOUR NECK DOWN TO YOUR TOES (washing for 3-5 minutes)  DO NOT use CHG soap on face, private areas, open wounds, or sores.  Pay special attention to the area where your surgery is being performed.  If you are having back surgery, having someone wash your back for you may be helpful. Wait 2 minutes after CHG soap is applied, then you may rinse off the CHG soap.  Pat dry with a clean towel  Put on clean pajamas    Additional instructions for the day of surgery: DO NOT APPLY any lotions, deodorants, cologne, or perfumes.   Do not wear jewelry or makeup Do not wear nail polish, gel polish, artificial nails, or any other type of covering on natural nails (fingers and toes) Do not bring valuables to the hospital. [facility] is not responsible for valuables/personal belongings. Put on clean/comfortable clothes.  Please  brush your teeth.  Ask your nurse before applying any prescription medications to the skin.     Please read over the following fact sheets that you were given. Coughing and Deep Breathing and Surgical Site Infection Prevention

## 2023-02-21 ENCOUNTER — Encounter (HOSPITAL_COMMUNITY)
Admission: RE | Admit: 2023-02-21 | Discharge: 2023-02-21 | Disposition: A | Discharge status: Home / Self Care | Payer: Medicare Other | Source: Ambulatory Visit | Attending: Orthopaedic Surgery | Admitting: Orthopaedic Surgery

## 2023-02-21 ENCOUNTER — Encounter (HOSPITAL_COMMUNITY): Payer: Self-pay

## 2023-02-21 ENCOUNTER — Other Ambulatory Visit: Payer: Self-pay

## 2023-02-21 VITALS — BP 191/85 | HR 69 | Temp 98.1°F | Resp 17 | Ht 63.0 in | Wt 177.9 lb

## 2023-02-21 DIAGNOSIS — Z01818 Encounter for other preprocedural examination: Secondary | ICD-10-CM

## 2023-02-21 DIAGNOSIS — I10 Essential (primary) hypertension: Secondary | ICD-10-CM | POA: Diagnosis not present

## 2023-02-21 DIAGNOSIS — Z01812 Encounter for preprocedural laboratory examination: Secondary | ICD-10-CM | POA: Diagnosis not present

## 2023-02-21 HISTORY — DX: Personal history of urinary calculi: Z87.442

## 2023-02-21 HISTORY — DX: Other specified postprocedural states: Z98.890

## 2023-02-21 LAB — CBC
HCT: 39.9 % (ref 36.0–46.0)
Hemoglobin: 13.8 g/dL (ref 12.0–15.0)
MCH: 27.7 pg (ref 26.0–34.0)
MCHC: 34.6 g/dL (ref 30.0–36.0)
MCV: 80.1 fL (ref 80.0–100.0)
Platelets: 310 10*3/uL (ref 150–400)
RBC: 4.98 MIL/uL (ref 3.87–5.11)
RDW: 13.9 % (ref 11.5–15.5)
WBC: 8.3 10*3/uL (ref 4.0–10.5)
nRBC: 0 % (ref 0.0–0.2)

## 2023-02-21 LAB — BASIC METABOLIC PANEL WITH GFR
Anion gap: 10 (ref 5–15)
BUN: 18 mg/dL (ref 8–23)
CO2: 27 mmol/L (ref 22–32)
Calcium: 9.6 mg/dL (ref 8.9–10.3)
Chloride: 106 mmol/L (ref 98–111)
Creatinine, Ser: 1.04 mg/dL — ABNORMAL HIGH (ref 0.44–1.00)
GFR, Estimated: 56 mL/min — ABNORMAL LOW
Glucose, Bld: 167 mg/dL — ABNORMAL HIGH (ref 70–99)
Potassium: 4 mmol/L (ref 3.5–5.1)
Sodium: 143 mmol/L (ref 135–145)

## 2023-02-21 LAB — GLUCOSE, CAPILLARY: Glucose-Capillary: 185 mg/dL — ABNORMAL HIGH (ref 70–99)

## 2023-02-21 NOTE — Progress Notes (Addendum)
PCP - Dr. Marlan Palau Cardiologist - denies  PPM/ICD - denies   Chest x-ray - denies EKG - 01/19/2023 Stress Test - 2011 ECHO - 2011 Cardiac Cath - denies  Sleep Study - denies   Fasting Blood Sugar - pt states that she does not check her blood sugar at home   Last dose of GLP1 agonist-  n/a   Aspirin Instructions: pt. States that she will call surgeon today for Aspirin instructions  ERAS Protcol - clears until 12:45 pm PRE-SURGERY Ensure or G2- patient given G2 to drink by 12:45 PM on DOS  COVID TEST- n/a   Anesthesia review: yes - elevated BP at PAT appointment  Patient denies shortness of breath, fever, cough and chest pain at PAT appointment   All instructions explained to the patient, with a verbal understanding of the material. Patient agrees to go over the instructions while at home for a better understanding. Patient also instructed to self quarantine after being tested for COVID-19. The opportunity to ask questions was provided.  Anesthesia notified of patient's blood pressure.  Patient states she took both her blood pressure medications this morning but is in a lot of pain and gets nervous when coming to appointments.  Patient is asymptomatic.  Patient agreed to check blood pressure at home and will reach out to her PCP.

## 2023-02-23 ENCOUNTER — Telehealth: Payer: Self-pay | Admitting: Internal Medicine

## 2023-02-23 MED ORDER — ALPRAZOLAM 0.5 MG PO TABS: ORAL_TABLET | ORAL | 0 refills | Status: AC

## 2023-02-23 NOTE — Telephone Encounter (Signed)
Kathleen Greer went for pre-op on Tuesday and she is calling to day concerning her BP being elevated at pre-op. I looked it up and it was 191/85. She is asking if she can still have surgery on Monday? Should I have her come in tomorrow for [place] to check it?

## 2023-02-23 NOTE — Telephone Encounter (Signed)
Copied from CRM [id number]. Topic: Clinical - Medical Advice >> Feb 23, 2023  2:53 PM Herbert Seta B wrote: Reason for CRM: Patient is scheduled for surgery Monday 02/27/23. Patient went to preop testing 11/5 and was told BP is running high. Patient feels BP is still high and would like to know if can still have surgery/procedure done Monday. Please call

## 2023-02-23 NOTE — Telephone Encounter (Signed)
Called Kathleen Greer and ask her to go pick up her medicine take it tonight as prescribed and take it in the morning before coming to the office for her appointment at 11:30. She verbalized understanding.

## 2023-02-24 ENCOUNTER — Encounter: Payer: Self-pay | Admitting: Internal Medicine

## 2023-02-24 ENCOUNTER — Ambulatory Visit (INDEPENDENT_AMBULATORY_CARE_PROVIDER_SITE_OTHER): Payer: Medicare Other | Admitting: Internal Medicine

## 2023-02-24 VITALS — BP 140/80 | HR 73 | Ht 63.75 in | Wt 179.0 lb

## 2023-02-24 DIAGNOSIS — F411 Generalized anxiety disorder: Secondary | ICD-10-CM

## 2023-02-24 DIAGNOSIS — I10 Essential (primary) hypertension: Secondary | ICD-10-CM | POA: Diagnosis not present

## 2023-02-24 DIAGNOSIS — Z01811 Encounter for preprocedural respiratory examination: Secondary | ICD-10-CM | POA: Diagnosis not present

## 2023-02-24 NOTE — Progress Notes (Signed)
Anesthesia Chart Review:  Case: [id number] Date/Time: 02/27/23 1541   Procedure: LEFT ARTHROSCOPY KNEE WITH LYSIS OF ADHESIONS / MANIPULATION UNDER ANESTHESIA (Left: Knee)   Anesthesia type: General   Pre-op diagnosis: LEFT KNEE ARTHROPLASTY ARTHROFIBROSIS   [facility] ROOM 07 / [facility]   Surgeons: Huel Cote, MD       DISCUSSION: Patient is a 77 year old female scheduled for the above procedure.  History includes never smoker, postoperative N/V, HTN, GERD, DM2, scoliosis, dependent edema, IBS, osteoarthritis (left TKA 12/07/21, right 02/18/21).  BP elevated at 02/21/23 PAT visit, 191/85. Previously 160/80 at PCP visit 01/19/23. She was advised to monitor home readings and follow-up with primary care if still elevated. She notified Dr. Lenord Fellers and reported constant left knee pain and anxiety about upcoming surgery. Xanxax BID as needed for anxiety prescribed. Follow-up office visit at Dr. Beryle Quant office on 02/24/23. BP 170/100->140/80, but reported SBP at home ~ 150's. Meds included  amlodipine 10 mg daily, losartan 100 mg daily, torsemide 20 mg daily. Had as needed oxycodone for pain. Note is still pending, but I do not see any new prescriptions.   Anesthesia team to evaluate on the day of surgery.    VS: BP (!) 191/85   Pulse 69   Temp 36.7 C   Resp 17   Ht 5\' 3"  (1.6 m)   Wt 80.7 kg   SpO2 99%   BMI 31.51 kg/m  BP Readings from Last 3 Encounters:  02/24/23 (!) 140/80  02/21/23 (!) 191/85  01/19/23 (!) 160/80     PROVIDERS: Margaree Mackintosh, MD is PCP    LABS: Labs reviewed: Acceptable for surgery. A1c 6.8% 01/16/23.  (all labs ordered are listed, but only abnormal results are displayed)  Labs Reviewed  GLUCOSE, CAPILLARY - Abnormal; Notable for the following components:      Result Value   Glucose-Capillary 185 (*)    All other components within normal limits  BASIC METABOLIC PANEL - Abnormal; Notable for the following components:   Glucose, Bld 167 (*)     Creatinine, Ser 1.04 (*)    GFR, Estimated 56 (*)    All other components within normal limits  CBC     EKG: 02/24/23:  Sinus Rhythm  Voltage criteria for LVH (R(I)+S(III) exceeds 2.50 mV) -Voltage criteria w/o ST/T abnormality may be normal. BORDERLINE   CV: Echo 09/27/09: Study Conclusions   - Left ventricle: The cavity size was normal. Systolic function was     normal. The estimated ejection fraction was in the range of 55% to     60%. Wall motion was normal; there were no regional wall motion     abnormalities. Left ventricular diastolic function parameters were     normal.   - Aortic valve: Mild regurgitation.    Past Medical History:  Diagnosis Date   Arthritis    Blood transfusion without reported diagnosis 04/18/1968   after hysterectomy   Dependent edema    Diabetes (HCC)    type 2   Generalized headaches    GERD (gastroesophageal reflux disease)    History of kidney stones    Hyperkalemia    Hypertension    IBS (irritable bowel syndrome)    PONV (postoperative nausea and vomiting)    Positional vertigo    Scoliosis     Past Surgical History:  Procedure Laterality Date   ABDOMINAL HYSTERECTOMY  1970   CARPAL TUNNEL RELEASE Right 2003   KNEE ARTHROSCOPY Right 2010   ROTATOR CUFF  REPAIR Right 6/02, 3/03   TOTAL KNEE ARTHROPLASTY Right 02/18/2021   Procedure: RIGHT TOTAL KNEE ARTHROPLASTY;  Surgeon: Kathryne Hitch, MD;  Location: [facility] OR;  Service: Orthopedics;  Laterality: Right;   TOTAL KNEE ARTHROPLASTY Left 12/07/2021   Procedure: LEFT TOTAL KNEE ARTHROPLASTY;  Surgeon: Kathryne Hitch, MD;  Location: [facility] OR;  Service: Orthopedics;  Laterality: Left;    MEDICATIONS:  ALPRAZolam (XANAX) 0.5 MG tablet   amLODipine (NORVASC) 10 MG tablet   aspirin EC 81 MG tablet   Blood Glucose Monitoring Suppl (ONE TOUCH ULTRA 2) w/Device KIT   Cholecalciferol (VITAMIN D) 50 MCG (2000 UT) tablet   diclofenac (VOLTAREN) 75 MG EC tablet   gabapentin  (NEURONTIN) 300 MG capsule   glipiZIDE (GLUCOTROL) 5 MG tablet   losartan (COZAAR) 100 MG tablet   Menthol, Topical Analgesic, (ICY HOT EX)   metFORMIN (GLUCOPHAGE) 1000 MG tablet   omeprazole (PRILOSEC) 20 MG capsule   ondansetron (ZOFRAN) 4 MG tablet   ONE TOUCH ULTRA TEST test strip   ONETOUCH DELICA LANCETS FINE MISC   oxyCODONE (ROXICODONE) 5 MG immediate release tablet   potassium chloride SA (KLOR-CON M) 20 MEQ tablet   Pyridoxine HCl (B-6) 100 MG TABS   rosuvastatin (CRESTOR) 5 MG tablet   sodium chloride (OCEAN) 0.65 % SOLN nasal spray   Tetrahydrozoline HCl (VISINE OP)   torsemide (DEMADEX) 20 MG tablet   No current facility-administered medications for this encounter.    Shonna Chock, PA-C Surgical Short Stay/Anesthesiology [facility] Phone [phone] [facility] Phone [phone] 02/24/2023 1:55 PM

## 2023-02-24 NOTE — Progress Notes (Signed)
Patient Care Team: Kathleen Mackintosh, MD as PCP - General (Internal Medicine)  Visit Date: 02/24/23  Subjective:    Patient ID: Kathleen Greer , Female   DOB: October 03, 1945, 78 y.o.    MRN: [id number]   77 y.o. Female presents today for elevated blood pressure. Blood pressure elevated in-office today at 170/100. She took alprazolam 0.5 mg 2-3 hours before this appointment. She is pending left knee arthroscopy on 02/27/23 with Dr. Steward Drone. 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 She is having constant left knee pain and is anxious about upcoming surgery. Taking Voltaren 75 mg twice daily as needed, amlodipine 10 mg daily, losartan 100 mg daily, torsemide 20 mg daily. Taking oxycodone 5 mg every four hours as needed for pain. Reports blood pressure at home has been in the 150s systolic. Discussed situational stress at length.BP was 160/80 on October 3. Seemed to be due to pain and situational stress.  Past Medical History:  Diagnosis Date   Arthritis    Blood transfusion without reported diagnosis 04/18/1968   after hysterectomy   Dependent edema    Diabetes (HCC)    type 2   Generalized headaches    GERD (gastroesophageal reflux disease)    History of kidney stones    Hyperkalemia    Hypertension    IBS (irritable bowel syndrome)    PONV (postoperative nausea and vomiting)    Positional vertigo    Scoliosis      Family History  Problem Relation Age of Onset   Heart disease Mother    Stroke Mother    Hypertension Mother    Heart disease Father    Breast cancer Sister    Heart disease Brother    Hypertension Son    Prostate cancer Son    Colon cancer Neg Hx     Social History   Social History Narrative   Social history: Patient is retired and formerly worked at [employer] as a Location manager. She does not smoke or consume alcohol.      Family history: Adult son with hypertension. Patient's father died with an MI. Mother died of an  MI. 1 brother died with heart failure. Another brother died in 03-30-05 with cirrhosis of the liver. Sister died at age 71. Another sister whose health is apparently unchanged   Has situational stress rasing 2 grandsons at home. Some financial issues recently.   Review of Systems  Constitutional:  Negative for fever and malaise/fatigue.  HENT:  Negative for congestion.   Eyes:  Negative for blurred vision.  Respiratory:  Negative for cough and shortness of breath.   Cardiovascular:  Negative for chest pain, palpitations and leg swelling.  Gastrointestinal:  Negative for vomiting.  Musculoskeletal:  Positive for joint pain (Left knee). Negative for back pain.  Skin:  Negative for rash.  Neurological:  Negative for loss of consciousness and headaches.  Psychiatric/Behavioral:  The patient is nervous/anxious.         Objective:   Vitals: BP (!) 170/100   Pulse 73   Ht 5' 3.75" (1.619 m)   Wt 179 lb (81.2 kg)   SpO2 97%   BMI 30.97 kg/m    Physical Exam Vitals and nursing note reviewed.  Constitutional:      General: She is not in acute distress.    Appearance: Normal appearance. She is not toxic-appearing.  HENT:     Head: Normocephalic and atraumatic.  Cardiovascular:     Rate  and Rhythm: Normal rate and regular rhythm. No extrasystoles are present.    Pulses: Normal pulses.     Heart sounds: Normal heart sounds. No murmur heard.    No friction rub. No gallop.  Pulmonary:     Effort: Pulmonary effort is normal. No respiratory distress.     Breath sounds: Normal breath sounds. No wheezing or rales.  Skin:    General: Skin is warm and dry.  Neurological:     Mental Status: She is alert and oriented to person, place, and time. Mental status is at baseline.  Psychiatric:        Mood and Affect: Mood normal.        Behavior: Behavior normal.        Thought Content: Thought content normal.        Judgment: Judgment normal.       Results:   Studies obtained and  personally reviewed by me:   Labs:       Component Value Date/Time   NA 143 02/21/2023 1030   K 4.0 02/21/2023 1030   CL 106 02/21/2023 1030   CO2 27 02/21/2023 1030   GLUCOSE 167 (H) 02/21/2023 1030   BUN 18 02/21/2023 1030   CREATININE 1.04 (H) 02/21/2023 1030   CREATININE 0.89 06/13/2022 1049   CALCIUM 9.6 02/21/2023 1030   PROT 7.7 09/16/2022 0909   ALBUMIN 4.4 02/25/2016 1133   AST 18 09/16/2022 0909   ALT 26 09/16/2022 0909   ALKPHOS 74 02/25/2016 1133   BILITOT 0.7 09/16/2022 0909   GFRNONAA 56 (L) 02/21/2023 1030   GFRNONAA 58 (L) 05/01/2020 0916   GFRAA 67 05/01/2020 0916     Lab Results  Component Value Date   WBC 8.3 02/21/2023   HGB 13.8 02/21/2023   HCT 39.9 02/21/2023   MCV 80.1 02/21/2023   PLT 310 02/21/2023    Lab Results  Component Value Date   CHOL 144 01/16/2023   HDL 56 01/16/2023   LDLCALC 73 01/16/2023   TRIG 70 01/16/2023   CHOLHDL 2.6 01/16/2023    Lab Results  Component Value Date   HGBA1C 6.8 (H) 01/16/2023     Lab Results  Component Value Date   TSH 1.81 06/13/2022      Assessment & Plan:   Anxiety state: Take alprazolam 0.5 mg and may take one half to one tab up to 3 times daily for anxiety and elevated BP. Continue current BP meds and can reassess after surgery.  Hypertension: EKG showed sinus rhythm and LVH. Continue with amlodipine 10 mg daily, losartan 100 mg daily, torsemide 20 mg daily. Same as June 2023.  Follow up call to her next week after surgery. Monitor BP at home. Take Xanax for anxiety  Return in 06/2023 for health maintenance exam or as needed.    I,Alexander Ruley,acting as a Neurosurgeon for Kathleen Mackintosh, MD.,have documented all relevant documentation on the behalf of Kathleen Mackintosh, MD,as directed by  Kathleen Mackintosh, MD while in the presence of Kathleen Mackintosh, MD.   I, Kathleen Mackintosh, MD, have reviewed all documentation for this visit. The documentation on 02/24/23 for the exam, diagnosis, procedures, and  orders are all accurate and complete.

## 2023-02-24 NOTE — Patient Instructions (Signed)
She will take Xanax  0.5mg  one half to one tab up to 3 times daily for anxiety surrounding surgery and situational stress/pain in knee.We will call her next week after surgery. EKG is stable today. Continue same blood pressure meds.

## 2023-02-24 NOTE — Anesthesia Preprocedure Evaluation (Signed)
Anesthesia Evaluation  Patient identified by MRN, date of birth, ID band Patient awake    Reviewed: Allergy & Precautions, H&P , NPO status , Patient's Chart, lab work & pertinent test results  History of Anesthesia Complications (+) PONV and history of anesthetic complications  Airway Mallampati: II  TM Distance: >3 FB Neck ROM: Full    Dental no notable dental hx.    Pulmonary neg pulmonary ROS   Pulmonary exam normal breath sounds clear to auscultation       Cardiovascular hypertension, Pt. on medications negative cardio ROS Normal cardiovascular exam Rhythm:Regular Rate:Normal     Neuro/Psych  Headaches  Anxiety      negative psych ROS   GI/Hepatic Neg liver ROS,GERD  ,,  Endo/Other  negative endocrine ROSdiabetes, Type 2    Renal/GU negative Renal ROS  negative genitourinary   Musculoskeletal  (+) Arthritis , Osteoarthritis,    Abdominal  (+) + obese  Peds negative pediatric ROS (+)  Hematology negative hematology ROS (+)   Anesthesia Other Findings   Reproductive/Obstetrics negative OB ROS                             Anesthesia Physical Anesthesia Plan  ASA: 3  Anesthesia Plan: General   Post-op Pain Management: Tylenol PO (pre-op)* and Gabapentin PO (pre-op)*   Induction: Intravenous  PONV Risk Score and Plan: 4 or greater and Ondansetron, Dexamethasone, Midazolam, Droperidol and Treatment may vary due to age or medical condition  Airway Management Planned: LMA  Additional Equipment:   Intra-op Plan:   Post-operative Plan: Extubation in OR  Informed Consent: I have reviewed the patients History and Physical, chart, labs and discussed the procedure including the risks, benefits and alternatives for the proposed anesthesia with the patient or authorized representative who has indicated his/her understanding and acceptance.     Dental advisory given  Plan  Discussed with: CRNA  Anesthesia Plan Comments: (PAT note written 02/24/2023 by Shonna Chock, PA-C.  )       Anesthesia Quick Evaluation

## 2023-02-26 ENCOUNTER — Encounter: Payer: Self-pay | Admitting: Internal Medicine

## 2023-02-27 ENCOUNTER — Other Ambulatory Visit: Payer: Self-pay

## 2023-02-27 ENCOUNTER — Ambulatory Visit (HOSPITAL_COMMUNITY): Payer: Medicare Other | Admitting: Vascular Surgery

## 2023-02-27 ENCOUNTER — Ambulatory Visit (HOSPITAL_COMMUNITY)
Admission: RE | Admit: 2023-02-27 | Discharge: 2023-02-27 | Disposition: A | Discharge status: Home / Self Care | Payer: Medicare Other | Attending: Orthopaedic Surgery | Admitting: Orthopaedic Surgery

## 2023-02-27 ENCOUNTER — Encounter (HOSPITAL_COMMUNITY)
Admission: RE | Disposition: A | Discharge status: Home / Self Care | Payer: Self-pay | Source: Home / Self Care | Attending: Orthopaedic Surgery

## 2023-02-27 ENCOUNTER — Ambulatory Visit (HOSPITAL_BASED_OUTPATIENT_CLINIC_OR_DEPARTMENT_OTHER): Payer: Medicare Other

## 2023-02-27 ENCOUNTER — Encounter (HOSPITAL_COMMUNITY): Payer: Self-pay | Admitting: Orthopaedic Surgery

## 2023-02-27 ENCOUNTER — Telehealth: Payer: Self-pay | Admitting: Orthopaedic Surgery

## 2023-02-27 DIAGNOSIS — K219 Gastro-esophageal reflux disease without esophagitis: Secondary | ICD-10-CM | POA: Insufficient documentation

## 2023-02-27 DIAGNOSIS — I10 Essential (primary) hypertension: Secondary | ICD-10-CM | POA: Insufficient documentation

## 2023-02-27 DIAGNOSIS — M24662 Ankylosis, left knee: Secondary | ICD-10-CM

## 2023-02-27 DIAGNOSIS — Z01818 Encounter for other preprocedural examination: Secondary | ICD-10-CM

## 2023-02-27 DIAGNOSIS — Z7984 Long term (current) use of oral hypoglycemic drugs: Secondary | ICD-10-CM | POA: Diagnosis not present

## 2023-02-27 DIAGNOSIS — M1990 Unspecified osteoarthritis, unspecified site: Secondary | ICD-10-CM | POA: Insufficient documentation

## 2023-02-27 DIAGNOSIS — Z96652 Presence of left artificial knee joint: Secondary | ICD-10-CM

## 2023-02-27 DIAGNOSIS — E119 Type 2 diabetes mellitus without complications: Secondary | ICD-10-CM | POA: Insufficient documentation

## 2023-02-27 DIAGNOSIS — Z79899 Other long term (current) drug therapy: Secondary | ICD-10-CM | POA: Diagnosis not present

## 2023-02-27 HISTORY — PX: KNEE ARTHROSCOPY: SHX127

## 2023-02-27 LAB — GLUCOSE, CAPILLARY
Glucose-Capillary: 139 mg/dL — ABNORMAL HIGH (ref 70–99)
Glucose-Capillary: 117 mg/dL — ABNORMAL HIGH (ref 70–99)

## 2023-02-27 SURGERY — ARTHROSCOPY, KNEE
Anesthesia: General | Site: Knee | Laterality: Left

## 2023-02-27 MED ORDER — OXYCODONE HCL 5 MG/5ML PO SOLN
ORAL | Status: AC
  Filled 2023-02-27: qty 5

## 2023-02-27 MED ORDER — HYDROMORPHONE HCL 1 MG/ML IJ SOLN: 0.2500 mg | INTRAMUSCULAR | Status: DC | PRN

## 2023-02-27 MED ORDER — 0.9 % SODIUM CHLORIDE (POUR BTL) OPTIME
TOPICAL | Status: DC | PRN
  Administered 2023-02-27: 1000 mL

## 2023-02-27 MED ORDER — KETOROLAC TROMETHAMINE 30 MG/ML IJ SOLN
INTRAMUSCULAR | Status: AC
  Filled 2023-02-27: qty 1

## 2023-02-27 MED ORDER — CEFAZOLIN SODIUM-DEXTROSE 2-4 GM/100ML-% IV SOLN
2.0000 g | INTRAVENOUS | Status: AC
  Administered 2023-02-27: 2 g via INTRAVENOUS
  Filled 2023-02-27: qty 100

## 2023-02-27 MED ORDER — INSULIN ASPART 100 UNIT/ML IJ SOLN: 0.0000 [IU] | INTRAMUSCULAR | Status: DC | PRN

## 2023-02-27 MED ORDER — AMISULPRIDE (ANTIEMETIC) 5 MG/2ML IV SOLN: 10.0000 mg | Freq: Once | INTRAVENOUS | Status: DC | PRN

## 2023-02-27 MED ORDER — LIDOCAINE 2% (20 MG/ML) 5 ML SYRINGE
INTRAMUSCULAR | Status: DC | PRN
  Administered 2023-02-27: 40 mg via INTRAVENOUS

## 2023-02-27 MED ORDER — ORAL CARE MOUTH RINSE: 15.0000 mL | Freq: Once | OROMUCOSAL | Status: AC

## 2023-02-27 MED ORDER — DEXAMETHASONE SODIUM PHOSPHATE 10 MG/ML IJ SOLN
INTRAMUSCULAR | Status: AC
  Filled 2023-02-27: qty 1

## 2023-02-27 MED ORDER — SODIUM CHLORIDE 0.9 % IV SOLN: 12.5000 mg | INTRAVENOUS | Status: DC | PRN

## 2023-02-27 MED ORDER — GABAPENTIN 300 MG PO CAPS
300.0000 mg | ORAL_CAPSULE | Freq: Once | ORAL | Status: AC
  Administered 2023-02-27: 300 mg via ORAL
  Filled 2023-02-27: qty 1

## 2023-02-27 MED ORDER — ONDANSETRON HCL 4 MG/2ML IJ SOLN
INTRAMUSCULAR | Status: AC
  Filled 2023-02-27: qty 2

## 2023-02-27 MED ORDER — OXYCODONE HCL 5 MG/5ML PO SOLN
5.0000 mg | Freq: Once | ORAL | Status: AC | PRN
  Administered 2023-02-27: 5 mg via ORAL

## 2023-02-27 MED ORDER — CHLORHEXIDINE GLUCONATE 0.12 % MT SOLN
15.0000 mL | Freq: Once | OROMUCOSAL | Status: AC
  Administered 2023-02-27: 15 mL via OROMUCOSAL
  Filled 2023-02-27: qty 15

## 2023-02-27 MED ORDER — DEXAMETHASONE SODIUM PHOSPHATE 10 MG/ML IJ SOLN
INTRAMUSCULAR | Status: DC | PRN
  Administered 2023-02-27: 5 mg via INTRAVENOUS

## 2023-02-27 MED ORDER — TRANEXAMIC ACID-NACL 1000-0.7 MG/100ML-% IV SOLN
1000.0000 mg | INTRAVENOUS | Status: AC
  Administered 2023-02-27: 1000 mg via INTRAVENOUS
  Filled 2023-02-27: qty 100

## 2023-02-27 MED ORDER — ONDANSETRON HCL 4 MG/2ML IJ SOLN
INTRAMUSCULAR | Status: DC | PRN
  Administered 2023-02-27: 4 mg via INTRAVENOUS

## 2023-02-27 MED ORDER — LACTATED RINGERS IV SOLN: INTRAVENOUS | Status: DC

## 2023-02-27 MED ORDER — SODIUM CHLORIDE 0.9 % IR SOLN
Status: DC | PRN
  Administered 2023-02-27: 3000 mL

## 2023-02-27 MED ORDER — PROPOFOL 10 MG/ML IV BOLUS
INTRAVENOUS | Status: AC
  Filled 2023-02-27: qty 20

## 2023-02-27 MED ORDER — OXYCODONE HCL 5 MG PO TABS: 5.0000 mg | ORAL_TABLET | Freq: Once | ORAL | Status: AC | PRN

## 2023-02-27 MED ORDER — FENTANYL CITRATE (PF) 250 MCG/5ML IJ SOLN
INTRAMUSCULAR | Status: DC | PRN
  Administered 2023-02-27 (×2): 50 ug via INTRAVENOUS

## 2023-02-27 MED ORDER — FENTANYL CITRATE (PF) 250 MCG/5ML IJ SOLN
INTRAMUSCULAR | Status: AC
  Filled 2023-02-27: qty 5

## 2023-02-27 MED ORDER — ACETAMINOPHEN 500 MG PO TABS
1000.0000 mg | ORAL_TABLET | Freq: Once | ORAL | Status: AC
  Administered 2023-02-27: 1000 mg via ORAL
  Filled 2023-02-27: qty 2

## 2023-02-27 MED ORDER — PROPOFOL 10 MG/ML IV BOLUS
INTRAVENOUS | Status: DC | PRN
  Administered 2023-02-27: 120 mg via INTRAVENOUS

## 2023-02-27 MED ORDER — KETOROLAC TROMETHAMINE 30 MG/ML IJ SOLN
INTRAMUSCULAR | Status: DC | PRN
  Administered 2023-02-27: 15 mg via INTRAVENOUS

## 2023-02-27 MED ORDER — LIDOCAINE 2% (20 MG/ML) 5 ML SYRINGE
INTRAMUSCULAR | Status: AC
  Filled 2023-02-27: qty 5

## 2023-02-27 SURGICAL SUPPLY — 56 items
APL PRP STRL LF DISP 70% ISPRP (MISCELLANEOUS) ×1
BAG COUNTER SPONGE SURGICOUNT (BAG) ×2 IMPLANT
BAG SPNG CNTER NS LX DISP (BAG) ×1
BANDAGE ESMARK 6X9 LF (GAUZE/BANDAGES/DRESSINGS) IMPLANT
BLADE CLIPPER SURG (BLADE) IMPLANT
BLADE EXCALIBUR 4.0X13 (MISCELLANEOUS) ×2 IMPLANT
BNDG CMPR 6 X 5 YARDS HK CLSR (GAUZE/BANDAGES/DRESSINGS) ×1
BNDG CMPR 9X6 STRL LF SNTH (GAUZE/BANDAGES/DRESSINGS)
BNDG CMPR MED 10X6 ELC LF (GAUZE/BANDAGES/DRESSINGS)
BNDG ELASTIC 6INX 5YD STR LF (GAUZE/BANDAGES/DRESSINGS) IMPLANT
BNDG ELASTIC 6X10 VLCR STRL LF (GAUZE/BANDAGES/DRESSINGS) IMPLANT
BNDG ESMARK 6X9 LF (GAUZE/BANDAGES/DRESSINGS)
CHLORAPREP W/TINT 26 (MISCELLANEOUS) ×2 IMPLANT
COOLER ICEMAN CLASSIC (MISCELLANEOUS) ×2 IMPLANT
COVER SURGICAL LIGHT HANDLE (MISCELLANEOUS) ×2 IMPLANT
CUFF TOURN SGL QUICK 34 (TOURNIQUET CUFF)
CUFF TOURN SGL QUICK 42 (TOURNIQUET CUFF) IMPLANT
CUFF TRNQT CYL 34X4.125X (TOURNIQUET CUFF) IMPLANT
DISSECTOR 4.0MM X 13CM (MISCELLANEOUS) IMPLANT
DRAPE ARTHROSCOPY W/POUCH 114 (DRAPES) ×2 IMPLANT
DRAPE U-SHAPE 47X51 STRL (DRAPES) ×2 IMPLANT
DRSG TEGADERM 4X4.75 (GAUZE/BANDAGES/DRESSINGS) ×2 IMPLANT
DW OUTFLOW CASSETTE/TUBE SET (MISCELLANEOUS) ×2 IMPLANT
GAUZE PAD ABD 8X10 STRL (GAUZE/BANDAGES/DRESSINGS) IMPLANT
GAUZE SPONGE 4X4 12PLY STRL (GAUZE/BANDAGES/DRESSINGS) IMPLANT
GAUZE XEROFORM 1X8 LF (GAUZE/BANDAGES/DRESSINGS) IMPLANT
GLOVE BIOGEL PI IND STRL 6.5 (GLOVE) ×2 IMPLANT
GLOVE BIOGEL PI IND STRL 8 (GLOVE) ×4 IMPLANT
GLOVE ECLIPSE 6.0 STRL STRAW (GLOVE) ×2 IMPLANT
GLOVE INDICATOR 8.0 STRL GRN (GLOVE) ×2 IMPLANT
GOWN STRL REUS W/ TWL LRG LVL3 (GOWN DISPOSABLE) ×4 IMPLANT
GOWN STRL REUS W/TWL LRG LVL3 (GOWN DISPOSABLE) ×2
KIT BASIN OR (CUSTOM PROCEDURE TRAY) ×2 IMPLANT
KIT TURNOVER KIT B (KITS) ×2 IMPLANT
MANIFOLD NEPTUNE II (INSTRUMENTS) IMPLANT
NDL 18GX1X1/2 (RX/OR ONLY) (NEEDLE) IMPLANT
NDL HYPO 25GX1X1/2 BEV (NEEDLE) ×2 IMPLANT
NEEDLE 18GX1X1/2 (RX/OR ONLY) (NEEDLE) IMPLANT
NEEDLE HYPO 25GX1X1/2 BEV (NEEDLE) ×1 IMPLANT
NS IRRIG 1000ML POUR BTL (IV SOLUTION) IMPLANT
PACK ARTHROSCOPY DSU (CUSTOM PROCEDURE TRAY) ×2 IMPLANT
PAD ARMBOARD 7.5X6 YLW CONV (MISCELLANEOUS) ×4 IMPLANT
PAD CAST 4YDX4 CTTN HI CHSV (CAST SUPPLIES) IMPLANT
PADDING CAST COTTON 4X4 STRL (CAST SUPPLIES) ×1
PADDING CAST COTTON 6X4 STRL (CAST SUPPLIES) ×2 IMPLANT
PORT APPOLLO RF 90DEGREE MULTI (SURGICAL WAND) IMPLANT
SPONGE T-LAP 4X18 ~~LOC~~+RFID (SPONGE) ×2 IMPLANT
SUT ETHILON 3 0 PS 1 (SUTURE) ×2 IMPLANT
SYR 20ML ECCENTRIC (SYRINGE) ×2 IMPLANT
SYR CONTROL 10ML LL (SYRINGE) IMPLANT
SYR TB 1ML LUER SLIP (SYRINGE) ×2 IMPLANT
TOWEL GREEN STERILE (TOWEL DISPOSABLE) ×2 IMPLANT
TOWEL GREEN STERILE FF (TOWEL DISPOSABLE) ×2 IMPLANT
TUBE CONNECTING 12X1/4 (SUCTIONS) ×2 IMPLANT
TUBING ARTHROSCOPY IRRIG 16FT (MISCELLANEOUS) ×2 IMPLANT
WATER STERILE IRR 1000ML POUR (IV SOLUTION) ×2 IMPLANT

## 2023-02-27 NOTE — Interval H&P Note (Signed)
History and Physical Interval Note:  02/27/2023 2:48 PM  Kathleen Greer  has presented today for surgery, with the diagnosis of LEFT KNEE ARTHROPLASTY ARTHROFIBROSIS.  The various methods of treatment have been discussed with the patient and family. After consideration of risks, benefits and other options for treatment, the patient has consented to  Procedure(s): LEFT ARTHROSCOPY KNEE WITH LYSIS OF ADHESIONS / MANIPULATION UNDER ANESTHESIA (Left) as a surgical intervention.  The patient's history has been reviewed, patient examined, no change in status, stable for surgery.  I have reviewed the patient's chart and labs.  Questions were answered to the patient's satisfaction.     Huel Cote

## 2023-02-27 NOTE — Discharge Instructions (Signed)
Discharge Instructions    Attending Surgeon: Huel Cote, MD Office Phone Number: [phone]   Diagnosis and Procedures:    Surgeries Performed: Left knee lysis of adhesions  Discharge Plan:    Diet: Resume usual diet. Begin with light or bland foods.  Drink plenty of fluids.  Activity:  Activity and weight bearing as tolerated. You are advised to go home directly from the hospital or surgical center. Restrict your activities.  GENERAL INSTRUCTIONS: 1.  Please apply ice to your wound to help with swelling and inflammation. This will improve your comfort and your overall recovery following surgery.     2. Please call Dr. Serena Croissant office at [phone] with questions Monday-Friday during business hours. If no one answers, please leave a message and someone should get back to the patient within 24 hours. For emergencies please call 911 or proceed to the emergency room.   3. Patient to notify surgical team if experiences any of the following: Bowel/Bladder dysfunction, uncontrolled pain, nerve/muscle weakness, incision with increased drainage or redness, nausea/vomiting and Fever greater than 101.0 F.  Be alert for signs of infection including redness, streaking, odor, fever or chills. Be alert for excessive pain or bleeding and notify your surgeon immediately.  WOUND INSTRUCTIONS:   Leave your dressing, cast, or splint in place until your post operative visit.  Keep it clean and dry.  Always keep the incision clean and dry until the staples/sutures are removed. If there is no drainage from the incision you should keep it open to air. If there is drainage from the incision you must keep it covered at all times until the drainage stops  Do not soak in a bath tub, hot tub, pool, lake or other body of water until 21 days after your surgery and your incision is completely dry and healed.  If you have removable sutures (or staples) they must be removed 10-14 days (unless  otherwise instructed) from the day of your surgery.     1)  Elevate the extremity as much as possible.  2)  Keep the dressing clean and dry.  3)  Please call us if the dressing becomes wet or dirty.  4)  If you are experiencing worsening pain or worsening swelling, please call.     MEDICATIONS: Resume all previous home medications at the previous prescribed dose and frequency unless otherwise noted Start taking the  pain medications on an as-needed basis as prescribed  Please taper down pain medication over the next week following surgery.  Ideally you should not require a refill of any narcotic pain medication.  Take pain medication with food to minimize nausea. In addition to the prescribed pain medication, you may take over-the-counter pain relievers such as Tylenol.  Do NOT take additional tylenol if your pain medication already has tylenol in it.  Aspirin 325mg  daily per instructions on bottle. Narcotic policy: Per [facility] clinic policy, our goal is ensure optimal postoperative pain control with a multimodal pain management strategy. For all [facility] patients, our goal is to wean post-operative narcotic medications by 6 weeks post-operatively, and many times sooner. If this is not possible due to utilization of pain medication prior to surgery, your [facility] doctor will support your acute post-operative pain control for the first 6 weeks postoperatively, with a plan to transition you back to your primary pain team following that. Cyndia Skeeters will work to ensure a Therapist, occupational.       FOLLOWUP INSTRUCTIONS: 1. Follow up at the Physical  Therapy Clinic 3-4 days following surgery. This appointment should be scheduled unless other arrangements have been made.The Physical Therapy scheduling number is [phone] if an appointment has not already been arranged.  2. Contact Dr. Serena Croissant office during office hours at [phone] or the practice after hours line at [phone] for  non-emergencies. For medical emergencies call 911.   Discharge Location: Home

## 2023-02-27 NOTE — Telephone Encounter (Signed)
Kathleen Greer (PT) from [facility] called requesting office notes and surgery summary from today's surgery date 02/27/2023. Please fax to [phone]. Kathleen Greer phone number is [phone].

## 2023-02-27 NOTE — Transfer of Care (Signed)
Immediate Anesthesia Transfer of Care Note  Patient: Kathleen Greer  Procedure(s) Performed: LEFT ARTHROSCOPY KNEE WITH LYSIS OF ADHESIONS / MANIPULATION UNDER ANESTHESIA (Left: Knee)  Patient Location: PACU  Anesthesia Type:General  Level of Consciousness: drowsy  Airway & Oxygen Therapy: Patient Spontanous Breathing and Patient connected to nasal cannula oxygen  Post-op Assessment: Report given to RN and Post -op Vital signs reviewed and stable  Post vital signs: Reviewed and stable  Last Vitals:  Vitals Value Taken Time  BP    Temp    Pulse 66 02/27/23 1608  Resp    SpO2 94 % 02/27/23 1608  Vitals shown include unfiled device data.  Last Pain:  Vitals:   02/27/23 1351  PainSc: 0-No pain         Complications: No notable events documented.

## 2023-02-27 NOTE — Brief Op Note (Signed)
   Brief Op Note  Date of Surgery: 02/27/2023  Preoperative Diagnosis: LEFT KNEE ARTHROPLASTY ARTHROFIBROSIS  Postoperative Diagnosis: same  Procedure: Procedure(s): LEFT ARTHROSCOPY KNEE WITH LYSIS OF ADHESIONS / MANIPULATION UNDER ANESTHESIA  Implants: * No implants in log *  Surgeons: Surgeon(s): Huel Cote, MD  Anesthesia: General    Estimated Blood Loss: See anesthesia record  Complications: None  Condition to PACU: Stable  Benancio Deeds, MD 02/27/2023 4:02 PM

## 2023-02-27 NOTE — Anesthesia Procedure Notes (Signed)
Procedure Name: LMA Insertion Date/Time: 02/27/2023 3:37 PM  Performed by: April Holding, CRNAPre-anesthesia Checklist: Patient identified, Emergency Drugs available, Suction available and Patient being monitored Patient Re-evaluated:Patient Re-evaluated prior to induction Oxygen Delivery Method: Circle System Utilized Preoxygenation: Pre-oxygenation with 100% oxygen Induction Type: IV induction Ventilation: Mask ventilation without difficulty LMA: LMA inserted LMA Size: 4.0 Number of attempts: 1 Airway Equipment and Method: Bite block Placement Confirmation: positive ETCO2 Tube secured with: Tape Dental Injury: Teeth and Oropharynx as per pre-operative assessment

## 2023-02-27 NOTE — H&P (Signed)
Chief Complaint: Left knee pain        History of Present Illness:      Kathleen Greer is a 77 y.o. female presents today with ongoing left knee stiffness and swelling in the setting of previous total knee arthroplasty done by Dr. Magnus Ivan in August 2023.  There is not been any concern for infection.  She does have loss of flexion in the side.  She does have quite good extension.  She has been extensively working in physical therapy the last several months without any relief.  She subsequently has plateaued.  She is here today for further discussion and assessment.  She is here today with her husband       Surgical History:   As above   PMH/PSH/Family History/Social History/Meds/Allergies:         Past Medical History:  Diagnosis Date   Arthritis     Blood transfusion without reported diagnosis 04/18/1968    after hysterectomy   Dependent edema     Diabetes (HCC)      type 2   Generalized headaches     GERD (gastroesophageal reflux disease)     Hyperkalemia     Hypertension     IBS (irritable bowel syndrome)     Positional vertigo     Scoliosis               Past Surgical History:  Procedure Laterality Date   ABDOMINAL HYSTERECTOMY   1970   CARPAL TUNNEL RELEASE Right March 11, 2002   KNEE ARTHROSCOPY Right 11-Mar-2009   ROTATOR CUFF REPAIR Right 6/02, 3/03   TOTAL KNEE ARTHROPLASTY Right 02/18/2021    Procedure: RIGHT TOTAL KNEE ARTHROPLASTY;  Surgeon: Kathryne Hitch, MD;  Location: [facility] OR;  Service: Orthopedics;  Laterality: Right;   TOTAL KNEE ARTHROPLASTY Left 12/07/2021    Procedure: LEFT TOTAL KNEE ARTHROPLASTY;  Surgeon: Kathryne Hitch, MD;  Location: [facility] OR;  Service: Orthopedics;  Laterality: Left;        Social History         Socioeconomic History   Marital status: Divorced      Spouse name: Not on file   Number of children: 1   Years of education: Not on file   Highest education level: Not on file  Occupational History   Not on file   Tobacco Use   Smoking status: Never   Smokeless tobacco: Never  Vaping Use   Vaping status: Never Used  Substance and Sexual Activity   Alcohol use: No   Drug use: No   Sexual activity: Not on file  Other Topics Concern   Not on file  Social History Narrative    Social history: Patient is retired and formerly worked at [employer] as a Location manager. She does not smoke or consume alcohol.         Family history: Adult son with hypertension. Patient's father died with an MI. Mother died of an MI. 1 brother died with heart failure. Another brother died in 03/11/05 with cirrhosis of the liver. Sister died at age 60. Another sister whose health is apparently unchanged    Social Determinants of Health    Financial Resource Strain: Not on file  Food Insecurity: Not on file  Transportation Needs: Not on file  Physical Activity: Not on file  Stress: Not on file  Social Connections: Not on file         Family History  Problem Relation Age of Onset  Heart disease Mother     Stroke Mother     Hypertension Mother     Heart disease Father     Breast cancer Sister     Heart disease Brother     Hypertension Son     Prostate cancer Son     Colon cancer Neg Hx          Allergies       Allergies  Allergen Reactions   Penicillins        Skin irritation            Current Outpatient Medications  Medication Sig Dispense Refill   acetaminophen (TYLENOL) 500 MG tablet Take 1 tablet (500 mg total) by mouth every 8 (eight) hours for 10 days. 30 tablet 0   aspirin EC 325 MG tablet Take 1 tablet (325 mg total) by mouth daily. 30 tablet 0   oxyCODONE (ROXICODONE) 5 MG immediate release tablet Take 1 tablet (5 mg total) by mouth every 4 (four) hours as needed for severe pain or breakthrough pain. 15 tablet 0   amLODipine (NORVASC) 10 MG tablet TAKE 1 TABLET BY MOUTH DAILY 100 tablet 2   aspirin 81 MG chewable tablet Chew 1 tablet (81 mg total) by mouth 2 (two) times daily. 30 tablet 0    Blood Glucose Monitoring Suppl (ONE TOUCH ULTRA 2) w/Device KIT         Cholecalciferol (VITAMIN D) 50 MCG (2000 UT) tablet Take 2,000 Units by mouth in the morning.       diclofenac (VOLTAREN) 75 MG EC tablet Take 1 tablet (75 mg total) by mouth 2 (two) times daily between meals as needed. 60 tablet 3   gabapentin (NEURONTIN) 300 MG capsule Take 300 mg by mouth at bedtime.       glipiZIDE (GLUCOTROL) 5 MG tablet TAKE 1 TABLET BY MOUTH  BEFORE BREAKFAST AND SUPPER 180 tablet 3   losartan (COZAAR) 100 MG tablet TAKE 1 TABLET BY MOUTH DAILY 100 tablet 2   Menthol, Topical Analgesic, (ICY HOT EX) Apply 1 Application topically daily as needed (knee pain).       metFORMIN (GLUCOPHAGE) 1000 MG tablet TAKE 1 TABLET BY MOUTH DAILY  WITH BREAKFAST 100 tablet 2   omeprazole (PRILOSEC) 20 MG capsule TAKE 1 CAPSULE BY MOUTH DAILY 100 capsule 2   ondansetron (ZOFRAN) 4 MG tablet Take 1 tablet (4 mg total) by mouth every 8 (eight) hours as needed for nausea or vomiting. 20 tablet 0   ONE TOUCH ULTRA TEST test strip         ONETOUCH DELICA LANCETS FINE MISC         potassium chloride SA (KLOR-CON M) 20 MEQ tablet TAKE 1 TABLET BY MOUTH TWICE  DAILY 200 tablet 2   Pyridoxine HCl (B-6 PO) Take by mouth.       rosuvastatin (CRESTOR) 5 MG tablet TAKE 1 TABLET BY MOUTH DAILY 100 tablet 2   sodium chloride (OCEAN) 0.65 % SOLN nasal spray Place 1 spray into both nostrils as needed for congestion.       Tetrahydrozoline HCl (VISINE OP) Place 1 drop into both eyes daily as needed (irritation).       torsemide (DEMADEX) 20 MG tablet Take 1 tablet (20 mg total) by mouth in the morning. 90 tablet 3      No current facility-administered medications for this visit.      Imaging Results (Last 48 hours)  No results found.  Review of Systems:   A ROS was performed including pertinent positives and negatives as documented in the HPI.   Physical Exam :   Constitutional: NAD and appears stated age Neurological:  Alert and oriented Psych: Appropriate affect and cooperative There were no vitals taken for this visit.    Comprehensive Musculoskeletal Exam:     Left knee incision is well-appearing without erythema or drainage.  Range of motion is from full extension to approximately 85 degrees of flexion.  Minimal to no swelling about the knee.  No redness about the knee   Imaging:   Xray (3 views left knee): Status post total knee arthroplasty without evidence of complication     I personally reviewed and interpreted the radiographs.     Assessment:   76 y.o. 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  Given the fact that she has had limited improvement at this time I did discuss that it is likely that the scar tissue in her suprapatellar pouch has matured.  Given that we did discuss the possibility of knee arthroscopy with lysis of adhesions and manipulation under anesthesia.  I did specifically discuss all the risks of limitations as well as the rehab.  Associated with this.  I would like her to begin physical therapy following this.  After discussion she has elected for left knee arthroscopy with lysis of adhesions and manipulation under anesthesia   Plan :     - Plan for left knee arthroscopy with lysis of adhesions and manipulation under anesthesia       After a lengthy discussion of treatment options, including risks, benefits, alternatives, complications of surgical and nonsurgical conservative options, the patient elected surgical repair.    The patient  is aware of the material risks  and complications including, but not limited to injury to adjacent structures, neurovascular injury, infection, numbness, bleeding, implant failure, thermal burns, stiffness, persistent pain, failure to heal, disease transmission from allograft, need for further surgery, dislocation, anesthetic risks, blood clots, risks of death,and others. The  probabilities of surgical success and failure discussed with patient given their particular co-morbidities.The time and nature of expected rehabilitation and recovery was discussed.The patient's questions were all answered preoperatively.  No barriers to understanding were noted. I explained the natural history of the disease process and Rx rationale.  I explained to the patient what I considered to be reasonable expectations given their personal situation.  The final treatment plan was arrived at through a shared patient decision making process model.           I personally saw and evaluated the patient, and participated in the management and treatment plan.   Huel Cote, MD Attending Physician, Orthopedic Surgery   This document was dictated using Dragon voice recognition software. A reasonable attempt at proof reading has been made to minimize errors.

## 2023-02-27 NOTE — Op Note (Signed)
   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  The risk and benefits of the procedure were discussed in detail and documented in the pre-operative evaluation.   PREOPERATIVE DIAGNOSIS: 1. Left knee arthrofibrosis  POSTOPERATIVE DIAGNOSIS: Same.  PROCEDURE: 1. Left knee arthroscopic lysis of adhesions  SURGEON: Benancio Deeds MD  ASSISTANT: Kerby Less, ATC  ANESTHESIA:  general  IV FLUIDS AND URINE: See anesthesia record.  ANTIBIOTICS: Ancef  ESTIMATED BLOOD LOSS: 10 mL.  IMPLANTS:  * No implants in log *  DRAINS: None  CULTURES: None  COMPLICATIONS: none  DESCRIPTION OF PROCEDURE:  I identified the patient in the pre-operative holding area.  I marked the operative knee with my initials. I reviewed the risks and benefits of the proposed surgical intervention and the patient wished to proceed.  Anesthesia performed a peripheral nerve block.  Patient was subsequently taken back to the operating room.  The patient was transferred to the operative suite and placed in the supine position with all bony prominences padded.     SCDs were placed on the non-operative lower extremity. Appropriate antibiotics was administered within 1 hour before incision. The operative lower extremity was then prepped and draped in standard fashion. A time out was performed confirming the correct extremity, correct patient and correct procedure.    A standard anterolateral portal was made with an 11 blade.  The ideal position for the anteromedial portal was established using a spinal needle.  An accessory superior medial incision was established and the arthroscopic shaver and wand were systematically used in order to debride including the suprapatellar pouch medial and lateral gutter.  Following this, the knee was manipulated and went from 40 degrees of preoperative motion to approximately 110  degrees.    That concluded the case.  Skin was closed with 3-0 nylon. Xeroform gauze, gauze, Tegaderm, Iceman and brace were applied.  Instrument, sponge, and needle counts were correct prior to wound closure and at the conclusion of the case.  The patient was taken to the PACU without complication     POSTOPERATIVE PLAN: She will be weightbearing as tolerated and activity as tolerated left leg.  She was placed on 2 weeks of aspirin for blood clot prevention.  I will see her back in 2 weeks for suture removal  Benancio Deeds, MD 4:02 PM

## 2023-02-28 ENCOUNTER — Encounter (HOSPITAL_COMMUNITY): Payer: Self-pay | Admitting: Orthopaedic Surgery

## 2023-02-28 DIAGNOSIS — M25562 Pain in left knee: Secondary | ICD-10-CM | POA: Diagnosis not present

## 2023-02-28 DIAGNOSIS — M6281 Muscle weakness (generalized): Secondary | ICD-10-CM | POA: Diagnosis not present

## 2023-02-28 NOTE — Telephone Encounter (Signed)
Op note faxed.

## 2023-02-28 NOTE — Anesthesia Postprocedure Evaluation (Signed)
Anesthesia Post Note  Patient: Shawnah Jersey  Procedure(s) Performed: LEFT ARTHROSCOPY KNEE WITH LYSIS OF ADHESIONS / MANIPULATION UNDER ANESTHESIA (Left: Knee)     Patient location during evaluation: PACU Anesthesia Type: General Level of consciousness: awake and alert Pain management: pain level controlled Vital Signs Assessment: post-procedure vital signs reviewed and stable Respiratory status: spontaneous breathing, nonlabored ventilation, respiratory function stable and patient connected to nasal cannula oxygen Cardiovascular status: blood pressure returned to baseline and stable Postop Assessment: no apparent nausea or vomiting Anesthetic complications: no   No notable events documented.  Last Vitals:  Vitals:   02/27/23 1645 02/27/23 1700  BP: 139/71 (!) 154/81  Pulse: 66 67  Resp: 13 15  Temp:  36.6 C  SpO2: 94% 94%    Last Pain:  Vitals:   02/27/23 1645  PainSc: 0-No pain   Pain Goal:                   Shelton Silvas

## 2023-03-07 DIAGNOSIS — M6281 Muscle weakness (generalized): Secondary | ICD-10-CM | POA: Diagnosis not present

## 2023-03-07 DIAGNOSIS — M25562 Pain in left knee: Secondary | ICD-10-CM | POA: Diagnosis not present

## 2023-03-09 ENCOUNTER — Other Ambulatory Visit: Payer: Self-pay | Admitting: Internal Medicine

## 2023-03-09 DIAGNOSIS — Z1231 Encounter for screening mammogram for malignant neoplasm of breast: Secondary | ICD-10-CM

## 2023-03-10 DIAGNOSIS — M25562 Pain in left knee: Secondary | ICD-10-CM | POA: Diagnosis not present

## 2023-03-10 DIAGNOSIS — M6281 Muscle weakness (generalized): Secondary | ICD-10-CM | POA: Diagnosis not present

## 2023-03-13 DIAGNOSIS — M6281 Muscle weakness (generalized): Secondary | ICD-10-CM | POA: Diagnosis not present

## 2023-03-13 DIAGNOSIS — M25562 Pain in left knee: Secondary | ICD-10-CM | POA: Diagnosis not present

## 2023-03-14 DIAGNOSIS — M6281 Muscle weakness (generalized): Secondary | ICD-10-CM | POA: Diagnosis not present

## 2023-03-14 DIAGNOSIS — M25562 Pain in left knee: Secondary | ICD-10-CM | POA: Diagnosis not present

## 2023-03-15 ENCOUNTER — Ambulatory Visit: Payer: Medicare Other | Admitting: Physical Therapy

## 2023-03-15 ENCOUNTER — Ambulatory Visit (HOSPITAL_BASED_OUTPATIENT_CLINIC_OR_DEPARTMENT_OTHER): Payer: Medicare Other | Admitting: Orthopaedic Surgery

## 2023-03-15 DIAGNOSIS — Z96652 Presence of left artificial knee joint: Secondary | ICD-10-CM

## 2023-03-15 DIAGNOSIS — M25662 Stiffness of left knee, not elsewhere classified: Secondary | ICD-10-CM

## 2023-03-15 NOTE — Therapy (Signed)
  OUTPATIENT PHYSICAL THERAPY SCREEN @[address]   Patient Name: Kathleen Greer MRN: [id number] DOB:15-Jan-1946, 77 y.o., female Today's Date: 03/15/2023  END OF SESSION:  PT End of Session - 03/15/23 0942     Visit Number 1    Activity Tolerance Patient tolerated treatment well    Behavior During Therapy [facility] for tasks assessed/performed             Past Medical History:  Diagnosis Date   Arthritis    Blood transfusion without reported diagnosis 04/18/1968   after hysterectomy   Dependent edema    Diabetes (HCC)    type 2   Generalized headaches    GERD (gastroesophageal reflux disease)    History of kidney stones    Hyperkalemia    Hypertension    IBS (irritable bowel syndrome)    PONV (postoperative nausea and vomiting)    Positional vertigo    Scoliosis    Past Surgical History:  Procedure Laterality Date   ABDOMINAL HYSTERECTOMY  1970   CARPAL TUNNEL RELEASE Right 2003   KNEE ARTHROSCOPY Right 2010   KNEE ARTHROSCOPY Left 02/27/2023   Procedure: LEFT ARTHROSCOPY KNEE WITH LYSIS OF ADHESIONS / MANIPULATION UNDER ANESTHESIA;  Surgeon: Huel Cote, MD;  Location: [facility] OR;  Service: Orthopedics;  Laterality: Left;   ROTATOR CUFF REPAIR Right 6/02, 3/03   TOTAL KNEE ARTHROPLASTY Right 02/18/2021   Procedure: RIGHT TOTAL KNEE ARTHROPLASTY;  Surgeon: Kathryne Hitch, MD;  Location: [facility] OR;  Service: Orthopedics;  Laterality: Right;   TOTAL KNEE ARTHROPLASTY Left 12/07/2021   Procedure: LEFT TOTAL KNEE ARTHROPLASTY;  Surgeon: Kathryne Hitch, MD;  Location: [facility] OR;  Service: Orthopedics;  Laterality: Left;   Patient Active Problem List   Diagnosis Date Noted   Status post total left knee replacement 12/07/2021   Status post total right knee replacement 02/18/2021   Unilateral primary osteoarthritis, left knee 06/17/2020   Unilateral primary osteoarthritis, right knee 06/17/2020   Osteoarthritis of right knee 09/15/2017   Epistaxis 07/12/2016    Impaired glucose tolerance 09/04/2015   Metabolic syndrome 08/08/2014   Anxiety 06/02/2011   Hypertension 01/12/2011   GE reflux 01/12/2011   Dependent edema 01/12/2011   Benign positional vertigo 01/12/2011   Hx of adenomatous colonic polyps 01/12/2011     THERAPY DIAG:  Stiffness of left knee, not elsewhere classified  Goal of screen:  This patient was referred to Physical Therapy specialty screen by Huel Cote, MD for HEP.   [employer] HEP code:  [email or web address]  Clinical Impression & Plan:  Currently in PT elsewhere following lysis of adhesions following Lt TKA. Added mobilization exercises to current HEP and encouraged use of sleeve for compression as edema is building in popliteal region. Also educated on self massage to post knee.    Army Fossa PT, DPT 03/15/2023, 9:43 AM  [address] [place], [place] [address] [phone]   Note: charges not applied for screen.

## 2023-03-15 NOTE — Progress Notes (Signed)
Post Operative Evaluation    Procedure/Date of Surgery: Left knee lysis of adhesions 11/11  Interval History: She is overall doing extremely well.  Presents today 2 weeks status post above procedure.  She is working physical therapy.  Her pain is much improved and she is now able to sleep     PMH/PSH/Family History/Social History/Meds/Allergies:    Past Medical History:  Diagnosis Date   Arthritis    Blood transfusion without reported diagnosis 04/18/1968   after hysterectomy   Dependent edema    Diabetes (HCC)    type 2   Generalized headaches    GERD (gastroesophageal reflux disease)    History of kidney stones    Hyperkalemia    Hypertension    IBS (irritable bowel syndrome)    PONV (postoperative nausea and vomiting)    Positional vertigo    Scoliosis    Past Surgical History:  Procedure Laterality Date   ABDOMINAL HYSTERECTOMY  1970   CARPAL TUNNEL RELEASE Right 03/26/02   KNEE ARTHROSCOPY Right Mar 26, 2009   KNEE ARTHROSCOPY Left 02/27/2023   Procedure: LEFT ARTHROSCOPY KNEE WITH LYSIS OF ADHESIONS / MANIPULATION UNDER ANESTHESIA;  Surgeon: Huel Cote, MD;  Location: [facility] OR;  Service: Orthopedics;  Laterality: Left;   ROTATOR CUFF REPAIR Right 6/02, 3/03   TOTAL KNEE ARTHROPLASTY Right 02/18/2021   Procedure: RIGHT TOTAL KNEE ARTHROPLASTY;  Surgeon: Kathryne Hitch, MD;  Location: [facility] OR;  Service: Orthopedics;  Laterality: Right;   TOTAL KNEE ARTHROPLASTY Left 12/07/2021   Procedure: LEFT TOTAL KNEE ARTHROPLASTY;  Surgeon: Kathryne Hitch, MD;  Location: [facility];  Service: Orthopedics;  Laterality: Left;   Social History   Socioeconomic History   Marital status: Divorced    Spouse name: Not on file   Number of children: 1   Years of education: Not on file   Highest education level: Not on file  Occupational History   Not on file  Tobacco Use   Smoking status: Never   Smokeless tobacco: Never  Vaping Use   Vaping  status: Never Used  Substance and Sexual Activity   Alcohol use: No   Drug use: No   Sexual activity: Not on file  Other Topics Concern   Not on file  Social History Narrative   Social history: Patient is retired and formerly worked at [employer] as a Location manager. She does not smoke or consume alcohol.      Family history: Adult son with hypertension. Patient's father died with an MI. Mother died of an MI. 1 brother died with heart failure. Another brother died in Mar 26, 2005 with cirrhosis of the liver. Sister died at age 68. Another sister whose health is apparently unchanged   Social Determinants of Health   Financial Resource Strain: Not on file  Food Insecurity: Not on file  Transportation Needs: Not on file  Physical Activity: Not on file  Stress: Not on file  Social Connections: Not on file   Family History  Problem Relation Age of Onset   Heart disease Mother    Stroke Mother    Hypertension Mother    Heart disease Father    Breast cancer Sister    Heart disease Brother    Hypertension Son    Prostate cancer Son    Colon cancer Neg Hx  Allergies  Allergen Reactions   Penicillins     Skin irritation   Current Outpatient Medications  Medication Sig Dispense Refill   ALPRAZolam (XANAX) 0.5 MG tablet One half to one tab by mouth twice daily as needed for anxiety 60 tablet 0   amLODipine (NORVASC) 10 MG tablet TAKE 1 TABLET BY MOUTH DAILY 100 tablet 2   aspirin EC 81 MG tablet Take 81 mg by mouth daily. Swallow whole.     Blood Glucose Monitoring Suppl (ONE TOUCH ULTRA 2) w/Device KIT      Cholecalciferol (VITAMIN D) 50 MCG (2000 UT) tablet Take 2,000 Units by mouth in the morning.     diclofenac (VOLTAREN) 75 MG EC tablet Take 1 tablet (75 mg total) by mouth 2 (two) times daily between meals as needed. 60 tablet 3   gabapentin (NEURONTIN) 300 MG capsule Take 300 mg by mouth at bedtime as needed (nerve pain).     glipiZIDE (GLUCOTROL) 5 MG tablet TAKE 1 TABLET BY MOUTH   BEFORE BREAKFAST AND SUPPER 180 tablet 3   losartan (COZAAR) 100 MG tablet TAKE 1 TABLET BY MOUTH DAILY 100 tablet 2   Menthol, Topical Analgesic, (ICY HOT EX) Apply 1 Application topically daily as needed (knee pain).     metFORMIN (GLUCOPHAGE) 1000 MG tablet TAKE 1 TABLET BY MOUTH DAILY  WITH BREAKFAST 100 tablet 2   omeprazole (PRILOSEC) 20 MG capsule TAKE 1 CAPSULE BY MOUTH DAILY (Patient taking differently: 20 mg daily as needed (heartburn).) 100 capsule 2   ondansetron (ZOFRAN) 4 MG tablet Take 1 tablet (4 mg total) by mouth every 8 (eight) hours as needed for nausea or vomiting. 20 tablet 0   ONE TOUCH ULTRA TEST test strip      ONETOUCH DELICA LANCETS FINE MISC      oxyCODONE (ROXICODONE) 5 MG immediate release tablet Take 1 tablet (5 mg total) by mouth every 4 (four) hours as needed for severe pain or breakthrough pain. 15 tablet 0   potassium chloride SA (KLOR-CON M) 20 MEQ tablet TAKE 1 TABLET BY MOUTH TWICE  DAILY (Patient taking differently: Take 20 mEq by mouth 3 (three) times a week.) 200 tablet 2   Pyridoxine HCl (B-6) 100 MG TABS Take 100 mg by mouth daily.     rosuvastatin (CRESTOR) 5 MG tablet TAKE 1 TABLET BY MOUTH DAILY 100 tablet 2   sodium chloride (OCEAN) 0.65 % SOLN nasal spray Place 1 spray into both nostrils as needed for congestion.     Tetrahydrozoline HCl (VISINE OP) Place 1 drop into both eyes daily as needed (irritation).     torsemide (DEMADEX) 20 MG tablet Take 1 tablet (20 mg total) by mouth in the morning. 90 tablet 3   No current facility-administered medications for this visit.   No results found.  Review of Systems:   A ROS was performed including pertinent positives and negatives as documented in the HPI.   Musculoskeletal Exam:    There were no vitals taken for this visit.  Left knee incisions are well-appearing without erythema or drainage.  Range of motion is from 0 to 100 degrees.  Distal neurosensory exam is intact 2+ dorsalis pedis  pulse  Imaging:      I personally reviewed and interpreted the radiographs.   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  Overall she is doing very well.  Would like her to continue to work through  physical therapy.  I will plan to see her back in 4 weeks for reassessment  Plan :    -Return to clinic in 4 weeks for reassessment for final check      I personally saw and evaluated the patient, and participated in the management and treatment plan.  Huel Cote, MD Attending Physician, Orthopedic Surgery  This document was dictated using Dragon voice recognition software. A reasonable attempt at proof reading has been made to minimize errors.

## 2023-03-19 DIAGNOSIS — J209 Acute bronchitis, unspecified: Secondary | ICD-10-CM | POA: Diagnosis not present

## 2023-03-19 DIAGNOSIS — R509 Fever, unspecified: Secondary | ICD-10-CM | POA: Diagnosis not present

## 2023-03-19 DIAGNOSIS — J019 Acute sinusitis, unspecified: Secondary | ICD-10-CM | POA: Diagnosis not present

## 2023-03-19 DIAGNOSIS — R051 Acute cough: Secondary | ICD-10-CM | POA: Diagnosis not present

## 2023-03-19 DIAGNOSIS — R0981 Nasal congestion: Secondary | ICD-10-CM | POA: Diagnosis not present

## 2023-03-20 DIAGNOSIS — M25562 Pain in left knee: Secondary | ICD-10-CM | POA: Diagnosis not present

## 2023-03-20 DIAGNOSIS — M6281 Muscle weakness (generalized): Secondary | ICD-10-CM | POA: Diagnosis not present

## 2023-03-24 DIAGNOSIS — M6281 Muscle weakness (generalized): Secondary | ICD-10-CM | POA: Diagnosis not present

## 2023-03-24 DIAGNOSIS — M25562 Pain in left knee: Secondary | ICD-10-CM | POA: Diagnosis not present

## 2023-03-28 DIAGNOSIS — M6281 Muscle weakness (generalized): Secondary | ICD-10-CM | POA: Diagnosis not present

## 2023-03-28 DIAGNOSIS — M25562 Pain in left knee: Secondary | ICD-10-CM | POA: Diagnosis not present

## 2023-03-31 DIAGNOSIS — M25562 Pain in left knee: Secondary | ICD-10-CM | POA: Diagnosis not present

## 2023-03-31 DIAGNOSIS — M6281 Muscle weakness (generalized): Secondary | ICD-10-CM | POA: Diagnosis not present

## 2023-04-02 ENCOUNTER — Other Ambulatory Visit: Payer: Self-pay | Admitting: Family

## 2023-04-03 DIAGNOSIS — M6281 Muscle weakness (generalized): Secondary | ICD-10-CM | POA: Diagnosis not present

## 2023-04-03 DIAGNOSIS — M25562 Pain in left knee: Secondary | ICD-10-CM | POA: Diagnosis not present

## 2023-04-06 DIAGNOSIS — M6281 Muscle weakness (generalized): Secondary | ICD-10-CM | POA: Diagnosis not present

## 2023-04-06 DIAGNOSIS — M25562 Pain in left knee: Secondary | ICD-10-CM | POA: Diagnosis not present

## 2023-04-07 ENCOUNTER — Ambulatory Visit
Admission: RE | Admit: 2023-04-07 | Discharge: 2023-04-07 | Disposition: A | Discharge status: Home / Self Care | Payer: Medicare Other | Source: Ambulatory Visit | Attending: Internal Medicine | Admitting: Internal Medicine

## 2023-04-07 DIAGNOSIS — Z1231 Encounter for screening mammogram for malignant neoplasm of breast: Secondary | ICD-10-CM

## 2023-04-18 DIAGNOSIS — M25562 Pain in left knee: Secondary | ICD-10-CM | POA: Diagnosis not present

## 2023-04-18 DIAGNOSIS — M6281 Muscle weakness (generalized): Secondary | ICD-10-CM | POA: Diagnosis not present

## 2023-04-20 DIAGNOSIS — M6281 Muscle weakness (generalized): Secondary | ICD-10-CM | POA: Diagnosis not present

## 2023-04-20 DIAGNOSIS — M25562 Pain in left knee: Secondary | ICD-10-CM | POA: Diagnosis not present

## 2023-04-21 ENCOUNTER — Ambulatory Visit (HOSPITAL_BASED_OUTPATIENT_CLINIC_OR_DEPARTMENT_OTHER): Payer: Medicare Other | Admitting: Orthopaedic Surgery

## 2023-04-21 DIAGNOSIS — Z96652 Presence of left artificial knee joint: Secondary | ICD-10-CM

## 2023-04-21 NOTE — Progress Notes (Signed)
 Post Operative Evaluation    Procedure/Date of Surgery: Left knee lysis of adhesions 11/11  Interval History: She is overall doing extremely well.  Presents today 6 weeks status post above procedure.  She is working physical therapy.  Overall she is continuing to improve slowly.  She does have some swelling after a long day of activity     PMH/PSH/Family History/Social History/Meds/Allergies:    Past Medical History:  Diagnosis Date   Arthritis    Blood transfusion without reported diagnosis 04/18/1968   after hysterectomy   Dependent edema    Diabetes (HCC)    type 2   Generalized headaches    GERD (gastroesophageal reflux disease)    History of kidney stones    Hyperkalemia    Hypertension    IBS (irritable bowel syndrome)    PONV (postoperative nausea and vomiting)    Positional vertigo    Scoliosis    Past Surgical History:  Procedure Laterality Date   ABDOMINAL HYSTERECTOMY  1970   CARPAL TUNNEL RELEASE Right 2002-03-27   KNEE ARTHROSCOPY Right March 27, 2009   KNEE ARTHROSCOPY Left 02/27/2023   Procedure: LEFT ARTHROSCOPY KNEE WITH LYSIS OF ADHESIONS / MANIPULATION UNDER ANESTHESIA;  Surgeon: Genelle Standing, MD;  Location: [facility];  Service: Orthopedics;  Laterality: Left;   ROTATOR CUFF REPAIR Right 6/02, 3/03   TOTAL KNEE ARTHROPLASTY Right 02/18/2021   Procedure: RIGHT TOTAL KNEE ARTHROPLASTY;  Surgeon: Vernetta Lonni GRADE, MD;  Location: [facility];  Service: Orthopedics;  Laterality: Right;   TOTAL KNEE ARTHROPLASTY Left 12/07/2021   Procedure: LEFT TOTAL KNEE ARTHROPLASTY;  Surgeon: Vernetta Lonni GRADE, MD;  Location: [facility];  Service: Orthopedics;  Laterality: Left;   Social History   Socioeconomic History   Marital status: Divorced    Spouse name: Not on file   Number of children: 1   Years of education: Not on file   Highest education level: Not on file  Occupational History   Not on file  Tobacco Use   Smoking status: Never    Smokeless tobacco: Never  Vaping Use   Vaping status: Never Used  Substance and Sexual Activity   Alcohol use: No   Drug use: No   Sexual activity: Not on file  Other Topics Concern   Not on file  Social History Narrative   Social history: Patient is retired and formerly worked at [employer] as a location manager. She does not smoke or consume alcohol.      Family history: Adult son with hypertension. Patient's father died with an MI. Mother died of an MI. 1 brother died with heart failure. Another brother died in Mar 27, 2005 with cirrhosis of the liver. Sister died at age 82. Another sister whose health is apparently unchanged   Social Drivers of Corporate Investment Banker Strain: Not on file  Food Insecurity: Not on file  Transportation Needs: Not on file  Physical Activity: Not on file  Stress: Not on file  Social Connections: Not on file   Family History  Problem Relation Age of Onset   Heart disease Mother    Stroke Mother    Hypertension Mother    Heart disease Father    Breast cancer Sister    Heart disease Brother    Hypertension Son    Prostate cancer Son  Colon cancer Neg Hx    Allergies  Allergen Reactions   Penicillins     Skin irritation   Current Outpatient Medications  Medication Sig Dispense Refill   ALPRAZolam  (XANAX ) 0.5 MG tablet One half to one tab by mouth twice daily as needed for anxiety 60 tablet 0   amLODipine  (NORVASC ) 10 MG tablet TAKE 1 TABLET BY MOUTH DAILY 100 tablet 2   aspirin  EC 81 MG tablet Take 81 mg by mouth daily. Swallow whole.     Blood Glucose Monitoring Suppl (ONE TOUCH ULTRA 2) w/Device KIT      Cholecalciferol  (VITAMIN D ) 50 MCG (2000 UT) tablet Take 2,000 Units by mouth in the morning.     diclofenac  (VOLTAREN ) 75 MG EC tablet Take 1 tablet (75 mg total) by mouth 2 (two) times daily between meals as needed. 60 tablet 3   gabapentin  (NEURONTIN ) 300 MG capsule Take 300 mg by mouth at bedtime as needed (nerve pain).     glipiZIDE   (GLUCOTROL ) 5 MG tablet TAKE 1 TABLET BY MOUTH  BEFORE BREAKFAST AND SUPPER 180 tablet 3   losartan  (COZAAR ) 100 MG tablet TAKE 1 TABLET BY MOUTH DAILY 100 tablet 2   Menthol , Topical Analgesic, (ICY HOT EX) Apply 1 Application topically daily as needed (knee pain).     metFORMIN  (GLUCOPHAGE ) 1000 MG tablet TAKE 1 TABLET BY MOUTH DAILY  WITH BREAKFAST 100 tablet 2   omeprazole  (PRILOSEC) 20 MG capsule TAKE 1 CAPSULE BY MOUTH DAILY (Patient taking differently: 20 mg daily as needed (heartburn).) 100 capsule 2   ondansetron  (ZOFRAN ) 4 MG tablet Take 1 tablet (4 mg total) by mouth every 8 (eight) hours as needed for nausea or vomiting. 20 tablet 0   ONE TOUCH ULTRA TEST test strip      ONETOUCH DELICA LANCETS FINE MISC      oxyCODONE  (ROXICODONE ) 5 MG immediate release tablet Take 1 tablet (5 mg total) by mouth every 4 (four) hours as needed for severe pain or breakthrough pain. 15 tablet 0   potassium chloride  SA (KLOR-CON  M) 20 MEQ tablet TAKE 1 TABLET BY MOUTH TWICE  DAILY (Patient taking differently: Take 20 mEq by mouth 3 (three) times a week.) 200 tablet 2   Pyridoxine HCl (B-6) 100 MG TABS Take 100 mg by mouth daily.     rosuvastatin  (CRESTOR ) 5 MG tablet TAKE 1 TABLET BY MOUTH DAILY 100 tablet 2   sodium chloride  (OCEAN) 0.65 % SOLN nasal spray Place 1 spray into both nostrils as needed for congestion.     Tetrahydrozoline HCl (VISINE OP) Place 1 drop into both eyes daily as needed (irritation).     torsemide  (DEMADEX ) 20 MG tablet Take 1 tablet (20 mg total) by mouth in the morning. 90 tablet 3   No current facility-administered medications for this visit.   No results found.  Review of Systems:   A ROS was performed including pertinent positives and negatives as documented in the HPI.   Musculoskeletal Exam:    There were no vitals taken for this visit.  Left knee incisions are well-appearing without erythema or drainage.  Range of motion is from 0 to 100 degrees.  Distal  neurosensory exam is intact 2+ dorsalis pedis pulse  Imaging:      I personally reviewed and interpreted the radiographs.   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  Overall she is doing very well.  At  this time I do believe she would benefit from an additional month physical therapy for range of motion.  I will plan to see her back as needed Plan :    -Return to clinic as needed      I personally saw and evaluated the patient, and participated in the management and treatment plan.  Elspeth Parker, MD Attending Physician, Orthopedic Surgery  This document was dictated using Dragon voice recognition software. A reasonable attempt at proof reading has been made to minimize errors.

## 2023-05-04 DIAGNOSIS — M6281 Muscle weakness (generalized): Secondary | ICD-10-CM | POA: Diagnosis not present

## 2023-05-04 DIAGNOSIS — M25562 Pain in left knee: Secondary | ICD-10-CM | POA: Diagnosis not present

## 2023-05-09 DIAGNOSIS — M25562 Pain in left knee: Secondary | ICD-10-CM | POA: Diagnosis not present

## 2023-05-09 DIAGNOSIS — M6281 Muscle weakness (generalized): Secondary | ICD-10-CM | POA: Diagnosis not present

## 2023-05-11 DIAGNOSIS — M25562 Pain in left knee: Secondary | ICD-10-CM | POA: Diagnosis not present

## 2023-05-11 DIAGNOSIS — M6281 Muscle weakness (generalized): Secondary | ICD-10-CM | POA: Diagnosis not present

## 2023-05-16 DIAGNOSIS — M6281 Muscle weakness (generalized): Secondary | ICD-10-CM | POA: Diagnosis not present

## 2023-05-16 DIAGNOSIS — M25562 Pain in left knee: Secondary | ICD-10-CM | POA: Diagnosis not present

## 2023-05-18 DIAGNOSIS — M25562 Pain in left knee: Secondary | ICD-10-CM | POA: Diagnosis not present

## 2023-05-18 DIAGNOSIS — M6281 Muscle weakness (generalized): Secondary | ICD-10-CM | POA: Diagnosis not present

## 2023-05-23 DIAGNOSIS — M6281 Muscle weakness (generalized): Secondary | ICD-10-CM | POA: Diagnosis not present

## 2023-05-23 DIAGNOSIS — M25562 Pain in left knee: Secondary | ICD-10-CM | POA: Diagnosis not present

## 2023-05-29 DIAGNOSIS — M25562 Pain in left knee: Secondary | ICD-10-CM | POA: Diagnosis not present

## 2023-05-29 DIAGNOSIS — M6281 Muscle weakness (generalized): Secondary | ICD-10-CM | POA: Diagnosis not present

## 2023-06-05 ENCOUNTER — Other Ambulatory Visit: Payer: Self-pay | Admitting: Internal Medicine

## 2023-06-06 DIAGNOSIS — M25562 Pain in left knee: Secondary | ICD-10-CM | POA: Diagnosis not present

## 2023-06-06 DIAGNOSIS — M6281 Muscle weakness (generalized): Secondary | ICD-10-CM | POA: Diagnosis not present

## 2023-06-13 DIAGNOSIS — M25562 Pain in left knee: Secondary | ICD-10-CM | POA: Diagnosis not present

## 2023-06-13 DIAGNOSIS — M6281 Muscle weakness (generalized): Secondary | ICD-10-CM | POA: Diagnosis not present

## 2023-06-14 NOTE — Progress Notes (Signed)
 Annual Medicare Wellness Visit   Patient Care Team: Margaree Mackintosh, MD as PCP - General (Internal Medicine)  Visit Date: 06/20/23   Chief Complaint  Patient presents with   Medicare Wellness   Annual Exam   Subjective:  Patient: Kathleen Greer, Female DOB: 16-Jun-1945, 78 y.o. MRN: [id number]  Vitals:   06/20/23 1100 06/20/23 1142  BP: (!) 170/100 (!) 150/90   Kathleen Greer is a 78 y.o. Female who presents today for her Annual Medicare Wellness Visit. Patient has history of Arthritis, Dependent Edema, Diabetes, Generalized Headaches, GERD, Hyperkalemia, Hypertension, IBS, Positional Vertigo, and Scoliosis.   02/2023 Total Left Knee Arthroscopy w/ Lysis Of Adhesions/Manipulation Under Anesthesia with Dr. Huel Cote. Now s/p ~ 4 months her PT has been overseen by Breck Coons, and on 04/21/2023 followed up with Dr. Steward Drone, who determined she is doing extremely well overall w/ note of some swelling after extensive activity. Friday will be her last day of PT, she says. Does note some lingering pain during certain movements.     History of Diabetes Mellitus, type II treated with Glucophage 1000 mg daily with breakfast and 5 mg Glipizide BID before breakfast & supper. 06/12/2023 Blood Glucose 120, decreased from 138. HgbA1c: 7.0, elevated from 6.8% on 01/16/23, up from 6.2 % on 06/13/22. Microalbumin/Creatinine, compared to 06/13/2022: Creatinine 336, significantly elevated from 134.    History of Hypertension treated with 10 mg Amlodipine daily and 100 mg Losartan daily. Blood Pressure: hypertensive today at 170/100. Says that her BP does tend to run high when in appointments, and at room 140 systolic. Dependent Edema treated with Torsemide 20 mg daily in the morning - notes she doesn't take Torsemide if she is going out, but will take once she is home.     History of Hyperlipidemia treated with 5 mg Rosuvastatin daily. 06/12/2023 Lipid Panel: WNL.    History of Hyperkalemia treated with  Klor-Con 20 meq twice daily.   History of GERD treated with Prilosec 20 mg daily.   History of Vitamin-D Deficiency treated with Vitamin D 2,000 units in the morning.    History of Anxiety; Situational Stress managed with 0.5 mg Xanax.    Labs 06/15/2023 CBC: Absolute Lymphocytes elevated at 4,374, however no prior comparisons; otherwise WNL. CMP, compared to 06/13/2022: Blood Glucose 120, decreased from 138; otherwise WNL.  TSH: 0.98  04/07/2023 Mammogram normal with recommended repeat 2025.   Colonoscopy 01/14/2014 normal with recommended repeat 2025, though has been discontinued automatically due to age.    Bone Density 09/14/2021. T-score Forearm Radius -0.2, normal.   Vaccine Counseling: Due for Flu - discussed; UTD on PNA and Tdap.  Past Medical History:  Diagnosis Date   Arthritis    Blood transfusion without reported diagnosis 04/18/1968   after hysterectomy   Dependent edema    Diabetes (HCC)    type 2   Generalized headaches    GERD (gastroesophageal reflux disease)    History of kidney stones    Hyperkalemia    Hypertension    IBS (irritable bowel syndrome)    PONV (postoperative nausea and vomiting)    Positional vertigo    Scoliosis   Medical/Surgical History Narrative:  2023 - s/p Left & Right Total Knee Arthroplasty. Status post left total knee arthroplasty done by Dr. Magnus Ivan in August 2023.   2018 - Right Rotator Cuff Surgery in June  2003 - Right Carpal Tunnel Release.  1971 - Hysterectomy w/o Oophorectomy  Other - hx of Numbness in Left  Great Toe. Allergy to Penicillin - rash. Hx of Scoliosis of the Lumbar Spine. Hx of Positional Vertigo. Family History  Problem Relation Age of Onset   Heart disease Mother    Stroke Mother    Hypertension Mother    Heart disease Father    Breast cancer Sister    Heart disease Brother    Hypertension Son    Prostate cancer Son    Colon cancer Neg Hx    Social History   Social History Narrative   Patient is  retired and formerly worked at [employer] as a Location manager. She does not smoke or consume alcohol.   05/2020 - Has been raising elementary school aged grandson as his father is in prison and mother has had drug abuse issues. Father is patient's grandson.    07/23/2022 - Her grandson is living with her. This is causing her some stress.         Family history: Adult son with hypertension. Patient's father died with an MI. Mother died of an MI. 1 brother died with heart failure. Another brother died in 2004-07-22 with cirrhosis of the liver. Sister died at age 10. Another sister whose health is apparently unchanged  07/23/2023 - has 2 great-grandsons living with her, 1 is 36 y.o just got a job at [employer], the other is 77 y.o (almost 18).  Review of Systems  Constitutional:  Negative for chills, fever, malaise/fatigue and weight loss.  HENT:  Negative for hearing loss, sinus pain and sore throat.   Respiratory:  Negative for cough, hemoptysis and shortness of breath.   Cardiovascular:  Negative for chest pain, palpitations, leg swelling and PND.  Gastrointestinal:  Negative for abdominal pain, constipation, diarrhea, heartburn, nausea and vomiting.  Genitourinary:  Negative for dysuria, frequency and urgency.  Musculoskeletal:  Positive for joint pain (left knee, improved but some lingering). Negative for back pain, myalgias and neck pain.  Skin:  Negative for itching and rash.  Neurological:  Negative for dizziness, tingling, seizures and headaches.  Endo/Heme/Allergies:  Negative for polydipsia.  Psychiatric/Behavioral:  Negative for depression. The patient is not nervous/anxious.     Objective:  Vitals: BP (!) 170/100   Pulse 62   Ht 5' 3.75" (1.619 m)   Wt 177 lb (80.3 kg)   SpO2 98%   BMI 30.62 kg/m  Physical Exam Vitals and nursing note reviewed.  Constitutional:      General: She is not in acute distress.    Appearance: Normal appearance. She is not ill-appearing or toxic-appearing.  HENT:      Head: Normocephalic and atraumatic.     Right Ear: Hearing, tympanic membrane, ear canal and external ear normal.     Left Ear: Hearing, tympanic membrane, ear canal and external ear normal.     Mouth/Throat:     Pharynx: Oropharynx is clear.  Eyes:     Extraocular Movements: Extraocular movements intact.     Pupils: Pupils are equal, round, and reactive to light.  Neck:     Thyroid: No thyroid mass, thyromegaly or thyroid tenderness.     Vascular: No carotid bruit.  Cardiovascular:     Rate and Rhythm: Normal rate and regular rhythm. No extrasystoles are present.    Pulses: Normal pulses.          Dorsalis pedis pulses are 2+ on the right side and 2+ on the left side.       Posterior tibial pulses are 2+ on the right side and 2+ on  the left side.     Heart sounds: Normal heart sounds. No murmur heard.    No friction rub. No gallop.  Pulmonary:     Effort: Pulmonary effort is normal.     Breath sounds: Normal breath sounds. No decreased breath sounds, wheezing, rhonchi or rales.  Chest:     Chest wall: No mass.  Abdominal:     Palpations: Abdomen is soft. There is no hepatomegaly, splenomegaly or mass.     Tenderness: There is no abdominal tenderness.     Hernia: No hernia is present.  Musculoskeletal:     Cervical back: Normal range of motion.     Right lower leg: Edema present.     Left lower leg: Edema present.     Comments: Osteoarthritis of hands. Heberden's and Bouchard's nodes on hands.  Radial deviation on right 4th finger.  Ulner deviation in right 3rd finger.  Lymphadenopathy:     Cervical: No cervical adenopathy.     Upper Body:     Right upper body: No supraclavicular adenopathy.     Left upper body: No supraclavicular adenopathy.  Skin:    General: Skin is warm and dry.  Neurological:     General: No focal deficit present.     Mental Status: She is alert and oriented to person, place, and time. Mental status is at baseline.     Sensory: Sensation is intact.      Motor: Motor function is intact. No weakness.     Deep Tendon Reflexes: Reflexes are normal and symmetric.  Psychiatric:        Attention and Perception: Attention normal.        Mood and Affect: Mood normal.        Speech: Speech normal.        Behavior: Behavior normal.        Thought Content: Thought content normal.        Cognition and Memory: Cognition normal.        Judgment: Judgment normal.   Most Recent Functional Status Assessment:    06/20/2023   10:53 AM  In your present state of health, do you have any difficulty performing the following activities:  Hearing? 0  Vision? 0  Difficulty concentrating or making decisions? 0  Walking or climbing stairs? 0  Dressing or bathing? 0  Doing errands, shopping? 0  Preparing Food and eating ? N  Using the Toilet? N  In the past six months, have you accidently leaked urine? N  Do you have problems with loss of bowel control? N  Managing your Medications? N  Managing your Finances? N  Housekeeping or managing your Housekeeping? N   Most Recent Fall Risk Assessment:    06/20/2023   10:54 AM  Fall Risk   Falls in the past year? 0  Number falls in past yr: 0  Injury with Fall? 0  Risk for fall due to : No Fall Risks  Follow up Falls prevention discussed;Education provided;Falls evaluation completed   Most Recent Depression Screenings:    06/20/2023   11:03 AM 06/17/2022   10:05 AM  PHQ 2/9 Scores  PHQ - 2 Score 0 0   Most Recent Cognitive Screening:    06/20/2023   11:04 AM  6CIT Screen  What Year? 0 points  What month? 0 points  What time? 0 points  Count back from 20 0 points  Months in reverse 0 points  Repeat phrase 0 points  Total Score 0 points  Results:  Studies Obtained And Personally Reviewed By Me:  04/07/2023 Mammogram normal with recommended repeat 2025.   Colonoscopy 01/14/2014 normal with recommended repeat 2025, though has been discontinued automatically due to age.    Bone Density 09/14/2021.  T-score Forearm Radius -0.2, normal.   Diabetic Foot Exam - Simple   Simple Foot Form Diabetic Foot exam was performed with the following findings: Yes 06/20/2023 11:39 AM  Visual Inspection No deformities, no ulcerations, no other skin breakdown bilaterally: Yes Sensation Testing Intact to touch and monofilament testing bilaterally: Yes Pulse Check Posterior Tibialis and Dorsalis pulse intact bilaterally: Yes Comments    Labs:     Component Value Date/Time   NA 141 06/15/2023 0916   K 4.4 06/15/2023 0916   CL 104 06/15/2023 0916   CO2 31 06/15/2023 0916   GLUCOSE 120 (H) 06/15/2023 0916   BUN 16 06/15/2023 0916   CREATININE 0.91 06/15/2023 0916   CALCIUM 9.3 06/15/2023 0916   PROT 6.7 06/15/2023 0916   ALBUMIN 4.4 02/25/2016 1133   AST 19 06/15/2023 0916   ALT 20 06/15/2023 0916   ALKPHOS 74 02/25/2016 1133   BILITOT 1.0 06/15/2023 0916   GFRNONAA 56 (L) 02/21/2023 1030   GFRNONAA 58 (L) 05/01/2020 0916   GFRAA 67 05/01/2020 0916    Lab Results  Component Value Date   WBC 7.1 06/15/2023   HGB 13.1 06/15/2023   HCT 38.2 06/15/2023   MCV 80.9 06/15/2023   PLT 275 06/15/2023   Lab Results  Component Value Date   CHOL 129 06/15/2023   HDL 55 06/15/2023   LDLCALC 58 06/15/2023   TRIG 82 06/15/2023   CHOLHDL 2.3 06/15/2023   Lab Results  Component Value Date   HGBA1C 7.0 (H) 06/15/2023    Lab Results  Component Value Date   TSH 0.98 06/15/2023    Assessment & Plan:   Orders Placed This Encounter  Procedures   POCT URINALYSIS DIP (CLINITEK)  Other Labs Reviewed today: CBC: Absolute Lymphocytes elevated at 4,374, however no prior comparisons; otherwise WNL. CMP, compared to 06/13/2022: Blood Glucose 120, decreased from 138; otherwise WNL.  TSH: 0.98  S/p Total Left Knee Arthroscopy ~ 4 months  out and her PT has been overseen by Breck Coons. On 04/21/2023 followed up with Dr. Steward Drone, who determined she is doing extremely well overall w/ note of some swelling  after extensive activity. Friday will be her last day of PT, she says. Does note some lingering pain during certain movements. Initially had left knee arthroplasty by Dr. Magnus Ivan August 2023.  Hx Right knee arthroplasty in Nov. 2022    Diabetes Mellitus, type II treated with Glucophage 1000 mg daily with breakfast and 5 mg Glipizide BID before breakfast & supper. 06/12/2023 Blood Glucose 120, decreased from 138. HgbA1c: 7.0, elevated from 6.8% on 01/16/23, up from 6.2 % on 06/13/22. Microalbumin/Creatinine, compared to 06/13/2022: Creatinine 336, significantly elevated from 134.   Instructed to start taking 10 mg Glipizide BID instead. F/u here in 6 weeks.   Hypertension treated with 10 mg Amlodipine daily and 100 mg Losartan daily. Blood Pressure: hypertensive today at 170/100 and 150/90 on recheck. Dependent Edema treated with Demadex 20 mg daily.   Switching Losartan to 20 mg Olmesartan  Hyperlipidemia treated with 5 mg Rosuvastatin daily. 06/12/2023 Lipid Panel: WNL.    Hyperkalemia treated with Klor-Con 20 meq twice daily.   GERD treated with Prilosec 20 mg daily.   Vitamin-D Deficiency treated with Vitamin D 2,000 units  in the morning.    Anxiety managed with 0.5 mg Xanax.    04/07/2023 Mammogram normal with recommended repeat 2025.   Colonoscopy 01/14/2014 normal with recommended repeat 2025. Ordering today.     Bone Density 09/14/2021. T-score Forearm Radius -0.2, normal.   Vaccine Counseling: Due for Flu - discussed; UTD on PNA and Tdap.  Return in 6 weeks for Hemoglobin A1c and BP recheck.    Annual wellness visit done today including the all of the following: Reviewed patient's Family Medical History Reviewed and updated list of patient's medical providers Assessment of cognitive impairment was done Assessed patient's functional ability Established a written schedule for health screening services Health Risk Assessent Completed and Reviewed  Discussed health benefits of  physical activity, and encouraged her to engage in regular exercise appropriate for her age and condition.    I,Emily Lagle,acting as a Neurosurgeon for Margaree Mackintosh, MD.,have documented all relevant documentation on the behalf of Margaree Mackintosh, MD,as directed by  Margaree Mackintosh, MD while in the presence of Margaree Mackintosh, MD.   I, Margaree Mackintosh, MD, have reviewed all documentation for this visit. The documentation on 06/30/23 for the exam, diagnosis, procedures, and orders are all accurate and complete.

## 2023-06-15 ENCOUNTER — Other Ambulatory Visit: Payer: Medicare Other

## 2023-06-15 DIAGNOSIS — I10 Essential (primary) hypertension: Secondary | ICD-10-CM | POA: Diagnosis not present

## 2023-06-15 DIAGNOSIS — E1169 Type 2 diabetes mellitus with other specified complication: Secondary | ICD-10-CM | POA: Diagnosis not present

## 2023-06-15 DIAGNOSIS — K219 Gastro-esophageal reflux disease without esophagitis: Secondary | ICD-10-CM

## 2023-06-15 DIAGNOSIS — E785 Hyperlipidemia, unspecified: Secondary | ICD-10-CM | POA: Diagnosis not present

## 2023-06-15 DIAGNOSIS — Z0000 Encounter for general adult medical examination without abnormal findings: Secondary | ICD-10-CM

## 2023-06-15 DIAGNOSIS — M1712 Unilateral primary osteoarthritis, left knee: Secondary | ICD-10-CM

## 2023-06-15 DIAGNOSIS — Z1329 Encounter for screening for other suspected endocrine disorder: Secondary | ICD-10-CM

## 2023-06-15 DIAGNOSIS — E119 Type 2 diabetes mellitus without complications: Secondary | ICD-10-CM | POA: Diagnosis not present

## 2023-06-16 LAB — LIPID PANEL
Cholesterol: 129 mg/dL (ref <200)
HDL: 55 mg/dL (ref >=50)
LDL Cholesterol (Calc): 58 mg/dL
Non-HDL Cholesterol (Calc): 74 mg/dL (ref <130)
Total CHOL/HDL Ratio: 2.3 (calc) (ref <5.0)
Triglycerides: 82 mg/dL (ref <150)

## 2023-06-16 LAB — CBC WITH DIFFERENTIAL/PLATELET
Absolute Lymphocytes: 4374 {cells}/uL — ABNORMAL HIGH (ref 850–3900)
Absolute Monocytes: 320 {cells}/uL (ref 200–950)
Basophils Absolute: 7 {cells}/uL (ref 0–200)
Basophils Relative: 0.1 %
Eosinophils Absolute: 291 {cells}/uL (ref 15–500)
Eosinophils Relative: 4.1 %
HCT: 38.2 % (ref 35.0–45.0)
Hemoglobin: 13.1 g/dL (ref 11.7–15.5)
MCH: 27.8 pg (ref 27.0–33.0)
MCHC: 34.3 g/dL (ref 32.0–36.0)
MCV: 80.9 fL (ref 80.0–100.0)
MPV: 10.8 fL (ref 7.5–12.5)
Monocytes Relative: 4.5 %
Neutro Abs: 2109 {cells}/uL (ref 1500–7800)
Neutrophils Relative %: 29.7 %
Platelets: 275 10*3/uL (ref 140–400)
RBC: 4.72 10*6/uL (ref 3.80–5.10)
RDW: 13.9 % (ref 11.0–15.0)
Total Lymphocyte: 61.6 %
WBC: 7.1 10*3/uL (ref 3.8–10.8)

## 2023-06-16 LAB — COMPLETE METABOLIC PANEL WITH GFR
AG Ratio: 1.7 (calc) (ref 1.0–2.5)
ALT: 20 U/L (ref 6–29)
AST: 19 U/L (ref 10–35)
Albumin: 4.2 g/dL (ref 3.6–5.1)
Alkaline phosphatase (APISO): 96 U/L (ref 37–153)
BUN: 16 mg/dL (ref 7–25)
CO2: 31 mmol/L (ref 20–32)
Calcium: 9.3 mg/dL (ref 8.6–10.4)
Chloride: 104 mmol/L (ref 98–110)
Creat: 0.91 mg/dL (ref 0.60–1.00)
Globulin: 2.5 g/dL (ref 1.9–3.7)
Glucose, Bld: 120 mg/dL — ABNORMAL HIGH (ref 65–99)
Potassium: 4.4 mmol/L (ref 3.5–5.3)
Sodium: 141 mmol/L (ref 135–146)
Total Bilirubin: 1 mg/dL (ref 0.2–1.2)
Total Protein: 6.7 g/dL (ref 6.1–8.1)
eGFR: 65 mL/min/{1.73_m2} (ref >=60)

## 2023-06-16 LAB — TSH: TSH: 0.98 m[IU]/L (ref 0.40–4.50)

## 2023-06-16 LAB — MICROALBUMIN / CREATININE URINE RATIO
Creatinine, Urine: 336 mg/dL — ABNORMAL HIGH (ref 20–275)
Microalb Creat Ratio: 12 mg/g{creat} (ref <30)
Microalb, Ur: 4 mg/dL

## 2023-06-16 LAB — HEMOGLOBIN A1C
Hgb A1c MFr Bld: 7 %{Hb} — ABNORMAL HIGH (ref <5.7)
Mean Plasma Glucose: 154 mg/dL
eAG (mmol/L): 8.5 mmol/L

## 2023-06-20 ENCOUNTER — Encounter: Payer: Self-pay | Admitting: Internal Medicine

## 2023-06-20 ENCOUNTER — Ambulatory Visit: Payer: Medicare Other | Admitting: Internal Medicine

## 2023-06-20 VITALS — BP 150/90 | HR 62 | Ht 63.75 in | Wt 177.0 lb

## 2023-06-20 DIAGNOSIS — Z0000 Encounter for general adult medical examination without abnormal findings: Secondary | ICD-10-CM

## 2023-06-20 DIAGNOSIS — Z96652 Presence of left artificial knee joint: Secondary | ICD-10-CM

## 2023-06-20 DIAGNOSIS — E785 Hyperlipidemia, unspecified: Secondary | ICD-10-CM | POA: Diagnosis not present

## 2023-06-20 DIAGNOSIS — Z96651 Presence of right artificial knee joint: Secondary | ICD-10-CM | POA: Diagnosis not present

## 2023-06-20 DIAGNOSIS — Z860101 Personal history of adenomatous and serrated colon polyps: Secondary | ICD-10-CM

## 2023-06-20 DIAGNOSIS — K219 Gastro-esophageal reflux disease without esophagitis: Secondary | ICD-10-CM

## 2023-06-20 DIAGNOSIS — F411 Generalized anxiety disorder: Secondary | ICD-10-CM

## 2023-06-20 DIAGNOSIS — I10 Essential (primary) hypertension: Secondary | ICD-10-CM | POA: Diagnosis not present

## 2023-06-20 DIAGNOSIS — E119 Type 2 diabetes mellitus without complications: Secondary | ICD-10-CM

## 2023-06-20 DIAGNOSIS — E1169 Type 2 diabetes mellitus with other specified complication: Secondary | ICD-10-CM | POA: Diagnosis not present

## 2023-06-20 LAB — POCT URINALYSIS DIP (CLINITEK)
Bilirubin, UA: NEGATIVE
Blood, UA: NEGATIVE
Glucose, UA: NEGATIVE mg/dL
Ketones, POC UA: NEGATIVE mg/dL
Leukocytes, UA: NEGATIVE
Nitrite, UA: NEGATIVE
POC PROTEIN,UA: >=300 — AB
Spec Grav, UA: 1.02 (ref 1.010–1.025)
Urobilinogen, UA: 0.2 U/dL
pH, UA: 6 (ref 5.0–8.0)

## 2023-06-20 MED ORDER — OLMESARTAN MEDOXOMIL 20 MG PO TABS
20.0000 mg | ORAL_TABLET | Freq: Every day | ORAL | 0 refills | Status: DC
Start: 2023-06-20 — End: 2023-08-21

## 2023-06-20 NOTE — Patient Instructions (Addendum)
 Increase Glipizide to 10 mg twice a day. Recheck Hgb AIC in 6 weeks. Colonoscopy due in October 2025. Change losartan to Benicar (olmesartan)and follow up in 6 weeks with OV, AIC and B-met.

## 2023-06-23 DIAGNOSIS — M25562 Pain in left knee: Secondary | ICD-10-CM | POA: Diagnosis not present

## 2023-06-23 DIAGNOSIS — M6281 Muscle weakness (generalized): Secondary | ICD-10-CM | POA: Diagnosis not present

## 2023-07-07 ENCOUNTER — Telehealth: Payer: Self-pay | Admitting: Internal Medicine

## 2023-07-07 DIAGNOSIS — M6281 Muscle weakness (generalized): Secondary | ICD-10-CM | POA: Diagnosis not present

## 2023-07-07 DIAGNOSIS — M25562 Pain in left knee: Secondary | ICD-10-CM | POA: Diagnosis not present

## 2023-07-07 NOTE — Telephone Encounter (Signed)
 Called patient to schedule her lab appointment and she told me her new blood pressure medicine is not helping she is still having elevated blood pressures. She gave me a couple, but she could not find her sheet where she had wrote them down. She said these were took at least 2 hours after taking her medication.  160/83 156/87 146/78  I ask her to keep a log and write down what time she takes medication and what time she takes blood pressure and to take at least 2 a day.

## 2023-07-07 NOTE — Telephone Encounter (Signed)
 Called patient back and ask her to be sure and keep that log from now until appointment on 07/18/2023 and bring with her, she verbalized understanding

## 2023-07-12 DIAGNOSIS — M25562 Pain in left knee: Secondary | ICD-10-CM | POA: Diagnosis not present

## 2023-07-12 DIAGNOSIS — M6281 Muscle weakness (generalized): Secondary | ICD-10-CM | POA: Diagnosis not present

## 2023-07-17 NOTE — Progress Notes (Incomplete)
 Patient Care Team: Margaree Mackintosh, MD as PCP - General (Internal Medicine)  Visit Date: 07/18/23  Subjective:   Chief Complaint  Patient presents with   essential hypertension    Hypertension follow up. Has log for review but not sure if her home machine is working right.    Vitals:   07/18/23 1010 07/18/23 1042  BP: (!) 160/88 (!) 150/80   Patient ZO:XWRUEA Toro,Female DOB:[birth year],78 y.o. VWU:[id number]  78 y.o. Female presents today for acute sick visit with Elevated BP. Patient has a past medical history of HTN; Dependent Edema. On 3/21 she said that her blood pressures are still raised, with examples of 160/83, 156/87, and 146/78, despite recently being started on Olmesartan 20 mg daily on 06/20/2023 in addition to her Amlodipine 10 mg and Torsemide 20 mg daily for dependent edema. Today blood pressure in office is hypertensive at 160/88, lowered to 150/80 on recheck. She says that this morning she took her Amlodipine 10 mg and Olmesartan 20 mg, but not the Torsemide 20 mg since she was going out. Says that her feet do swell mildly, especially in her left where she'd had surgery. She brought a list of blood pressures she'd logged after taking her blood pressure twice daily with a range of 131 - 194 systolic and 53 - 98 diastolic. Does note that she has been having lingering pain from her Left TKA in 02/2023, she does try to be active but will stop anything she is doing once her knee starts to hurt.   Past Medical History:  Diagnosis Date   Arthritis    Blood transfusion without reported diagnosis 04/18/1968   after hysterectomy   Dependent edema    Diabetes (HCC)    type 2   Generalized headaches    GERD (gastroesophageal reflux disease)    History of kidney stones    Hyperkalemia    Hypertension    IBS (irritable bowel syndrome)    PONV (postoperative nausea and vomiting)    Positional vertigo    Scoliosis     Allergies  Allergen Reactions   Penicillins     Skin  irritation    Family History  Problem Relation Age of Onset   Heart disease Mother    Stroke Mother    Hypertension Mother    Heart disease Father    Breast cancer Sister    Heart disease Brother    Hypertension Son    Prostate cancer Son    Colon cancer Neg Hx    Social History   Social History Narrative   Patient is retired and formerly worked at [employer] as a Location manager. She does not smoke or consume alcohol.   05/2020 - Has been raising elementary school aged grandson as his father is in prison and mother has had drug abuse issues. Father is patient's grandson.    06/2022 - Her grandson is living with her. This is causing her some stress.         Family history: Adult son with hypertension. Patient's father died with an MI. Mother died of an MI. 1 brother died with heart failure. Another brother died in 08/02/2004 with cirrhosis of the liver. Sister died at age 42. Another sister whose health is apparently unchanged  78 y.o Lucila Maine has been helping helping out around the house, learning how to cook and taking over some easy chores.  Review of Systems  Cardiovascular:  Positive for leg swelling (especially in left, mild).       (+)  Elevated BP  Musculoskeletal:  Positive for joint pain (Left Knee, s/p TKA).  All other systems reviewed and are negative.    Objective:  Vitals: BP (!) 150/80 (BP Location: Left Arm, Patient Position: Sitting, Cuff Size: Large)   Pulse 63   Temp 98.7 F (37.1 C) (Temporal)   Ht 5' 3.75" (1.619 m)   Wt 177 lb (80.3 kg)   SpO2 98%   BMI 30.62 kg/m   Physical Exam Vitals and nursing note reviewed.  Constitutional:      General: She is not in acute distress.    Appearance: Normal appearance. She is not toxic-appearing.  HENT:     Head: Normocephalic and atraumatic.  Cardiovascular:     Rate and Rhythm: Normal rate and regular rhythm. No extrasystoles are present.    Pulses: Normal pulses.     Heart sounds: Normal heart sounds. No murmur  heard.    No friction rub. No gallop.  Pulmonary:     Effort: Pulmonary effort is normal. No respiratory distress.     Breath sounds: Normal breath sounds. No wheezing or rales.  Musculoskeletal:     Right lower leg: Edema (mild) present.     Left lower leg: Edema (mild) present.  Skin:    General: Skin is warm and dry.  Neurological:     Mental Status: She is alert and oriented to person, place, and time. Mental status is at baseline.  Psychiatric:        Mood and Affect: Mood normal.        Behavior: Behavior normal.        Thought Content: Thought content normal.        Judgment: Judgment normal.     Results:  Studies Obtained And Personally Reviewed By Me: Labs:     Component Value Date/Time   NA 141 06/15/2023 0916   K 4.4 06/15/2023 0916   CL 104 06/15/2023 0916   CO2 31 06/15/2023 0916   GLUCOSE 120 (H) 06/15/2023 0916   BUN 16 06/15/2023 0916   CREATININE 0.91 06/15/2023 0916   CALCIUM 9.3 06/15/2023 0916   PROT 6.7 06/15/2023 0916   ALBUMIN 4.4 02/25/2016 1133   AST 19 06/15/2023 0916   ALT 20 06/15/2023 0916   ALKPHOS 74 02/25/2016 1133   BILITOT 1.0 06/15/2023 0916   GFRNONAA 56 (L) 02/21/2023 1030   GFRNONAA 58 (L) 05/01/2020 0916   GFRAA 67 05/01/2020 0916    Lab Results  Component Value Date   WBC 7.1 06/15/2023   HGB 13.1 06/15/2023   HCT 38.2 06/15/2023   MCV 80.9 06/15/2023   PLT 275 06/15/2023   Lab Results  Component Value Date   CHOL 129 06/15/2023   HDL 55 06/15/2023   LDLCALC 58 06/15/2023   TRIG 82 06/15/2023   CHOLHDL 2.3 06/15/2023   Lab Results  Component Value Date   HGBA1C 7.0 (H) 06/15/2023    Lab Results  Component Value Date   TSH 0.98 06/15/2023   Assessment & Plan:   Essential Hypertension; Dependent Edema: started on Olmesartan 20 mg daily on 06/20/2023. Also takes Amlodipine 10 mg, and for dependent edema takes Torsemide 20 mg daily, but not when she has plans to go out, so did not take Torsemide this morning. Blood  pressure in office is elevated at 160/88, lowered to 150/80 on recheck and at home has ranged 131 - 194 systolic and 53 - 98 diastolic since her last visit on 3/04. She is also not  sure if her at-home monitor is reading correctly, so will have her  bring it in come in  and compare with ours. Increasing Olmesartan to 40 mg daily. Instructed to log blood pressures twice daily (once in the morning and once early evening) and to take her Torsemide before appointments for more accurate readings as she does have some mild edema in her legs today.   S/p Left TKA in 02/2023 and she is still having some lingering pain from this, not sure if this possibly contributes to her elevated blood pressures. She does stay active and rests when her knee starts to bother her. I think not as physically active as she used to be. Says she does yard work.  Will have B-met and Hgb AIC in late April followed by OV. If not improving with this regimen, may need appt in [facility].  Hx of anxiety- has Xanax to take twice daily as needed but less situational stress recently  AODM- Hgb AIC was 7% in February. Needs to watch diet and walk some for exercise. Is on glipizide and Metformin     I,Emily Lagle,acting as a scribe for Margaree Mackintosh, MD.,have documented all relevant documentation on the behalf of Margaree Mackintosh, MD,as directed by  Margaree Mackintosh, MD while in the presence of Margaree Mackintosh, MD.   I, Margaree Mackintosh, MD, have reviewed all documentation for this visit. The documentation on 07/18/23 for the exam, diagnosis, procedures, and orders are all accurate and complete.

## 2023-07-18 ENCOUNTER — Encounter: Payer: Self-pay | Admitting: Internal Medicine

## 2023-07-18 ENCOUNTER — Ambulatory Visit (INDEPENDENT_AMBULATORY_CARE_PROVIDER_SITE_OTHER): Admitting: Internal Medicine

## 2023-07-18 VITALS — BP 150/80 | HR 63 | Temp 98.7°F | Ht 63.75 in | Wt 177.0 lb

## 2023-07-18 DIAGNOSIS — E119 Type 2 diabetes mellitus without complications: Secondary | ICD-10-CM | POA: Diagnosis not present

## 2023-07-18 DIAGNOSIS — I10 Essential (primary) hypertension: Secondary | ICD-10-CM

## 2023-07-18 DIAGNOSIS — R609 Edema, unspecified: Secondary | ICD-10-CM

## 2023-07-18 DIAGNOSIS — Z96651 Presence of right artificial knee joint: Secondary | ICD-10-CM | POA: Diagnosis not present

## 2023-07-18 DIAGNOSIS — Z96652 Presence of left artificial knee joint: Secondary | ICD-10-CM | POA: Diagnosis not present

## 2023-07-18 DIAGNOSIS — Z8659 Personal history of other mental and behavioral disorders: Secondary | ICD-10-CM

## 2023-07-18 NOTE — Patient Instructions (Addendum)
 Consider getting new blood pressure machine. She can bring her machine in to be checked against our. Increase olmesartan to 40 mg daily. Follow up in 3 weeks.

## 2023-08-01 NOTE — Progress Notes (Addendum)
 Patient Care Team: Sylvan Evener, MD as PCP - General (Internal Medicine)  Visit Date: 08/11/23  Subjective:   Chief Complaint  Patient presents with   Hypertension   Vitals:   08/11/23 0933  BP: 138/78  Patient Kathleen Greer,Female DOB:12-19-1945,78 y.o. GNF:[id number]   78 y.o. Female presents today for 6 week follow-up for Hypertension; Dependent Edema. Patient has a past medical history of S/p Left TKA; Anxiety. At last visit on 4/01 she was still having consistently elevated blood pressures at-home ranging from 131-194/53-98 and in the office that day was 160/88 that lowered to 150/80 despite being started on daily Olmesartan  20 mg on top of her regimen of Amlodipine  10 mg daily and Torsemide  20 mg daily. Notably, she does not take her Torsemide  when going out of the house, so hadn't taken it prior to that visit and she also mentioned that she wasn't sure if her at-home cuff was calibrated correctly, so she was instructed to return with her cuff for comparison and recalibration and to also take her Demadex  prior to this visit. Today, although she did not return with her blood pressure monitor, her blood pressure today is normotensive at 138/78 and her edema has improved significantly from when she was last here.   History of Diabetes Mellitus, type II treated with Metformin  1000 mg daily and previously Glipizide  5 mg twice daily, though she reported not taking that today.  08/10/2023 HgbA1c 7.1, elevated from 7.0 in 2/202, which further elevated from 6.8 in 12/2022. She mentions that she does tend to eat quite a few oranges and tangelos throughout the day.   Reviewed 08/10/2023 B-MET with Creatinine 1.26 and eGFR 44; regarding those she says that yesterday she was slightly dehydrated.   She mentions that she has been dealing with some stress due to her oldest grandson, who is supposedly working at [employer], however she isn't sure that he is going to work. Past Medical History:   Diagnosis Date   Arthritis    Blood transfusion without reported diagnosis 04/18/1968   after hysterectomy   Dependent edema    Diabetes (HCC)    type 2   Generalized headaches    GERD (gastroesophageal reflux disease)    History of kidney stones    Hyperkalemia    Hypertension    IBS (irritable bowel syndrome)    PONV (postoperative nausea and vomiting)    Positional vertigo    Scoliosis     Allergies  Allergen Reactions   Penicillins     Skin irritation    Family History  Problem Relation Age of Onset   Heart disease Mother    Stroke Mother    Hypertension Mother    Heart disease Father    Breast cancer Sister    Heart disease Brother    Hypertension Son    Prostate cancer Son    Colon cancer Neg Hx    Social History   Social History Narrative   Patient is retired and formerly worked at [employer] as a Location manager. She does not smoke or consume alcohol.   05/2020 - Has been raising elementary school aged grandson as his father is in prison and mother has had drug abuse issues. Father is patient's grandson.    06/2022 - Her grandson is living with her. This is causing her some stress.         Family history: Adult son with hypertension. Patient's father died with an MI. Mother died of an MI. 1 brother  died with heart failure. Another brother died in 09/06/2004 with cirrhosis of the liver. Sister died at age 58. Another sister whose health is apparently unchanged   Review of Systems  All other systems reviewed and are negative.    Objective:  Vitals: BP 138/78   Pulse 68   Temp 97.6 F (36.4 C) (Temporal)   Ht 5\' 3"  (1.6 m)   Wt 176 lb (79.8 kg)   SpO2 93%   BMI 31.18 kg/m   Physical Exam Vitals and nursing note reviewed.  Constitutional:      General: She is not in acute distress.    Appearance: Normal appearance. She is not toxic-appearing.  HENT:     Head: Normocephalic and atraumatic.  Cardiovascular:     Rate and Rhythm: Normal rate and regular  rhythm. No extrasystoles are present.    Pulses: Normal pulses.     Heart sounds: Normal heart sounds. No murmur heard.    No friction rub. No gallop.  Pulmonary:     Effort: Pulmonary effort is normal. No respiratory distress.     Breath sounds: Normal breath sounds. No wheezing or rales.  Skin:    General: Skin is warm and dry.  Neurological:     Mental Status: She is alert and oriented to person, place, and time. Mental status is at baseline.  Psychiatric:        Mood and Affect: Mood normal.        Behavior: Behavior normal.        Thought Content: Thought content normal.        Judgment: Judgment normal.     Results:  Studies Obtained And Personally Reviewed By Me: Labs:     Component Value Date/Time   NA 143 08/10/2023 1138   K 4.0 08/10/2023 1138   CL 102 08/10/2023 1138   CO2 29 08/10/2023 1138   GLUCOSE 74 08/10/2023 1138   BUN 20 08/10/2023 1138   CREATININE 1.26 (H) 08/10/2023 1138   CALCIUM  9.7 08/10/2023 1138   PROT 6.7 06/15/2023 0916   ALBUMIN 4.4 02/25/2016 1133   AST 19 06/15/2023 0916   ALT 20 06/15/2023 0916   ALKPHOS 74 02/25/2016 1133   BILITOT 1.0 06/15/2023 0916   GFRNONAA 56 (L) 02/21/2023 1030   GFRNONAA 58 (L) 05/01/2020 0916   GFRAA 67 05/01/2020 0916    Lab Results  Component Value Date   WBC 7.1 06/15/2023   HGB 13.1 06/15/2023   HCT 38.2 06/15/2023   MCV 80.9 06/15/2023   PLT 275 06/15/2023   Lab Results  Component Value Date   CHOL 129 06/15/2023   HDL 55 06/15/2023   LDLCALC 58 06/15/2023   TRIG 82 06/15/2023   CHOLHDL 2.3 06/15/2023   Lab Results  Component Value Date   HGBA1C 7.1 (H) 08/10/2023    Lab Results  Component Value Date   TSH 0.98 06/15/2023    No results found for any visits on 08/11/23. Assessment & Plan:   Orders Placed This Encounter  Procedures   BUN/Creatinine Ratio  Hypertension; Dependent Edema treated with daily Olmesartan  20 mg along with Amlodipine  10 mg daily and Torsemide  20 mg daily.  Today her blood pressure is normal at 138/78 and her edema has improved significantly from when she was last here.   Diabetes Mellitus, type II treated with Metformin  1000 mg daily and previously Glipizide  5 mg twice daily, though she reported not taking that today.  08/10/2023 HgbA1c 7.1, elevated from 7.0 in  2/202, which further elevated from 6.8 in 12/2022. Mentioned that she does tend to eat quite a few oranges and tangelos throughout the day, so recommended reducing those to two daily.  Elevated Creatinine 1.26 and eGFR 44 with recent labs. In Feb, creatinine was 0.91.  She says that yesterday she was slightly dehydrated. BUN and creatinine ordered today. Says she is well hydrated today.  Situational Stress due to her oldest grandson, who is supposedly working at [employer], however she isn't sure that he is going to work.  Patient will return in August for 6 month recheck. Will have B-met and Hgb AIC in advance of that appt.  30 minutes spent with patient today including time spent discussing situational stress with grandson.  I,Emily Lagle,acting as a Neurosurgeon for Sylvan Evener, MD.,have documented all relevant documentation on the behalf of Sylvan Evener, MD,as directed by  Sylvan Evener, MD while in the presence of Sylvan Evener, MD.   I, Sylvan Evener, MD, have reviewed all documentation for this visit. The documentation on 08/11/23 for the exam, diagnosis, procedures, and orders are all accurate and complete.

## 2023-08-03 ENCOUNTER — Ambulatory Visit: Admitting: Internal Medicine

## 2023-08-10 ENCOUNTER — Other Ambulatory Visit

## 2023-08-10 DIAGNOSIS — E119 Type 2 diabetes mellitus without complications: Secondary | ICD-10-CM

## 2023-08-10 DIAGNOSIS — I10 Essential (primary) hypertension: Secondary | ICD-10-CM | POA: Diagnosis not present

## 2023-08-11 ENCOUNTER — Ambulatory Visit: Admitting: Internal Medicine

## 2023-08-11 ENCOUNTER — Encounter: Payer: Self-pay | Admitting: Internal Medicine

## 2023-08-11 VITALS — BP 138/78 | HR 68 | Temp 97.6°F | Ht 63.0 in | Wt 176.0 lb

## 2023-08-11 DIAGNOSIS — F411 Generalized anxiety disorder: Secondary | ICD-10-CM

## 2023-08-11 DIAGNOSIS — F439 Reaction to severe stress, unspecified: Secondary | ICD-10-CM

## 2023-08-11 DIAGNOSIS — I10 Essential (primary) hypertension: Secondary | ICD-10-CM | POA: Diagnosis not present

## 2023-08-11 DIAGNOSIS — R7989 Other specified abnormal findings of blood chemistry: Secondary | ICD-10-CM

## 2023-08-11 DIAGNOSIS — E785 Hyperlipidemia, unspecified: Secondary | ICD-10-CM

## 2023-08-11 DIAGNOSIS — Z7984 Long term (current) use of oral hypoglycemic drugs: Secondary | ICD-10-CM | POA: Diagnosis not present

## 2023-08-11 DIAGNOSIS — R609 Edema, unspecified: Secondary | ICD-10-CM

## 2023-08-11 DIAGNOSIS — E119 Type 2 diabetes mellitus without complications: Secondary | ICD-10-CM | POA: Diagnosis not present

## 2023-08-11 LAB — BASIC METABOLIC PANEL WITH GFR
BUN/Creatinine Ratio: 16 (calc) (ref 6–22)
BUN: 20 mg/dL (ref 7–25)
CO2: 29 mmol/L (ref 20–32)
Calcium: 9.7 mg/dL (ref 8.6–10.4)
Chloride: 102 mmol/L (ref 98–110)
Creat: 1.26 mg/dL — ABNORMAL HIGH (ref 0.60–1.00)
Glucose, Bld: 74 mg/dL (ref 65–99)
Potassium: 4 mmol/L (ref 3.5–5.3)
Sodium: 143 mmol/L (ref 135–146)
eGFR: 44 mL/min/{1.73_m2} — ABNORMAL LOW (ref >=60)

## 2023-08-11 LAB — HEMOGLOBIN A1C
Hgb A1c MFr Bld: 7.1 % — ABNORMAL HIGH (ref <5.7)
Mean Plasma Glucose: 157 mg/dL
eAG (mmol/L): 8.7 mmol/L

## 2023-08-11 NOTE — Patient Instructions (Addendum)
 BUN and Creatinine re-collected today. Patient says she is well hydrated. We are monitoring her for chronic kidney disease with HTN and glucose intolerance. Her 6 month recheck will be in August. Labs will be done prior to that appointment.

## 2023-08-12 LAB — BUN/CREATININE RATIO
BUN/Creatinine Ratio: 18 (calc) (ref 6–22)
BUN: 21 mg/dL (ref 7–25)
Creat: 1.19 mg/dL — ABNORMAL HIGH (ref 0.60–1.00)
eGFR: 47 mL/min/{1.73_m2} — ABNORMAL LOW (ref >=60)

## 2023-08-21 ENCOUNTER — Other Ambulatory Visit: Payer: Self-pay | Admitting: Internal Medicine

## 2023-09-01 ENCOUNTER — Other Ambulatory Visit: Payer: Self-pay

## 2023-09-01 MED ORDER — OLMESARTAN MEDOXOMIL 20 MG PO TABS: 20.0000 mg | ORAL_TABLET | Freq: Every day | ORAL | 0 refills | Status: DC

## 2023-09-01 NOTE — Telephone Encounter (Signed)
 Notifed patient that olmesartan  20mg  was sent on 5/05 to [facility], she will call them to check on her prescriptions. I sent her olmesartan  20mg  15 tablets to her local pharmacy until she receives order from [facility].  Copied from CRM [id number]. Topic: Clinical - Prescription Issue >> Sep 01, 2023  3:05 PM Crispin Dolphin wrote: Reason for CRM: Patient called. States she is out of medication. olmesartan  (BENICAR ) 20 MG table. Per notes was sent 5/5 with refills. Patient also states that the Rx should be for 40Mg  now as well. It gets sent to [facility] order but she has not received it and once sent it make take a couple days to come through the mail. Would like to know if she should take losartan  until this is received. Thank you.

## 2023-10-09 ENCOUNTER — Other Ambulatory Visit: Payer: Self-pay | Admitting: Internal Medicine

## 2023-10-09 DIAGNOSIS — I10 Essential (primary) hypertension: Secondary | ICD-10-CM

## 2023-10-09 DIAGNOSIS — R7302 Impaired glucose tolerance (oral): Secondary | ICD-10-CM

## 2023-12-08 ENCOUNTER — Other Ambulatory Visit

## 2023-12-08 DIAGNOSIS — I10 Essential (primary) hypertension: Secondary | ICD-10-CM | POA: Diagnosis not present

## 2023-12-08 DIAGNOSIS — R7989 Other specified abnormal findings of blood chemistry: Secondary | ICD-10-CM | POA: Diagnosis not present

## 2023-12-08 DIAGNOSIS — R7302 Impaired glucose tolerance (oral): Secondary | ICD-10-CM

## 2023-12-08 NOTE — Progress Notes (Signed)
 Patient Care Team: Perri Ronal PARAS, MD as PCP - General (Internal Medicine)  Visit Date: 12/11/23  Subjective:   Chief Complaint  Patient presents with   Diabetes   Hypertension   Anxiety   Vitals:   12/11/23 0913 12/11/23 0924 12/11/23 0938  BP: (!) 160/88 (!) 150/82 (!) 150/82   Patient PI:Zczobw Donald,Female DOB:01-Dec-1945,78 y.o. MRN:[id number]    78 y.o.Female presents today for 6 months follow-up for Hypertension; Dependent Edema; Diabetes Mellitus, type II. Patient has a past medical history of S/p left TKA 02/2023, Anxiety. She is taking Olmesartan  20 mg, Amlodipine  10 mg, and Torsemide  20 mg daily for Hypertension. Does not take Torsemide  when going out of the house. As of 08/11/23 she was normotensive with a blood pressure of 138/78. Current blood pressure is  160/88 at 9:13 am and 150/82 and 9:24 am and 150/82 at 9:38 am.  If her blood pressure remains high, the dosage may have to be increased.   She has history of osteoarthritis in her hands, knees and shoulders. Both joints of her hands affected by osteoarthritis. She has had a knee replacement in both knees but says that the left one hurts significantly. Patient said when she first received the knee replacement in her left knee she felt a fiery burning sensation.  Says her right shoulder hurts when extended.    Kidney function results were high and will be rechecked at the next visit after she has had a couple glasses of water.    History of Diabetes Mellitus, Type II treated with Metformin  1000 mg daily. 12/11/23 HgbA1c 6.8.     Past Medical History:  Diagnosis Date   Arthritis    Blood transfusion without reported diagnosis 04/18/1968   after hysterectomy   Dependent edema    Diabetes (HCC)    type 2   Generalized headaches    GERD (gastroesophageal reflux disease)    History of kidney stones    Hyperkalemia    Hypertension    IBS (irritable bowel syndrome)    PONV (postoperative nausea and vomiting)     Positional vertigo    Scoliosis     Allergies  Allergen Reactions   Penicillins     Skin irritation   Immunization History  Administered Date(s) Administered   Influenza Split 12/30/2011   Influenza Whole 01/16/2009   Influenza,inj,Quad PF,6+ Mos 01/29/2013, 02/18/2014, 02/24/2015, 02/29/2016, 03/03/2017, 03/09/2018, 01/18/2019, 01/25/2021, 01/14/2022   Moderna Sars-Covid-2 Vaccination 05/30/2019, 06/26/2019, 03/11/2020, 05/01/2021   Pneumococcal Conjugate-13 08/08/2014   Pneumococcal Polysaccharide-23 12/30/2011   Tdap 07/17/2009, 11/01/2019   Zoster, Live 11/26/2010   Past Surgical History:  Procedure Laterality Date   ABDOMINAL HYSTERECTOMY  1970   CARPAL TUNNEL RELEASE Right 2003   KNEE ARTHROSCOPY Right 2010   KNEE ARTHROSCOPY Left 02/27/2023   Procedure: LEFT ARTHROSCOPY KNEE WITH LYSIS OF ADHESIONS / MANIPULATION UNDER ANESTHESIA;  Surgeon: Genelle Standing, MD;  Location: [facility] OR;  Service: Orthopedics;  Laterality: Left;   ROTATOR CUFF REPAIR Right 6/02, 3/03   TOTAL KNEE ARTHROPLASTY Right 02/18/2021   Procedure: RIGHT TOTAL KNEE ARTHROPLASTY;  Surgeon: Vernetta Lonni GRADE, MD;  Location: [facility] OR;  Service: Orthopedics;  Laterality: Right;   TOTAL KNEE ARTHROPLASTY Left 12/07/2021   Procedure: LEFT TOTAL KNEE ARTHROPLASTY;  Surgeon: Vernetta Lonni GRADE, MD;  Location: [facility] OR;  Service: Orthopedics;  Laterality: Left;    Family History  Problem Relation Age of Onset   Heart disease Mother    Stroke Mother    Hypertension Mother  Heart disease Father    Breast cancer Sister    Heart disease Brother    Hypertension Son    Prostate cancer Son    Colon cancer Neg Hx    Social History   Social History Narrative   Patient is retired and formerly worked at [employer] as a Location manager. She does not smoke or consume alcohol.   05/2020 - Has been raising elementary school aged grandson as his father is in prison and mother has had drug abuse issues. Father is  patient's grandson.    06/2022 - Her grandson is living with her. This is causing her some stress.         Family history: Adult son with hypertension. Patient's father died with an MI. Mother died of an MI. 1 brother died with heart failure. Another brother died in 2005/01/06 with cirrhosis of the liver. Sister died at age 46. Another sister whose health is apparently unchanged  Grandson no longer lives  with her.  Review of Systems  Constitutional:  Negative for fever and malaise/fatigue.  HENT:  Negative for congestion.   Eyes:  Negative for blurred vision.  Respiratory:  Negative for cough and shortness of breath.   Cardiovascular:  Negative for chest pain, palpitations and leg swelling.  Gastrointestinal:  Negative for vomiting.  Musculoskeletal:  Positive for joint pain. Negative for back pain.  Skin:  Negative for rash.  Neurological:  Negative for loss of consciousness and headaches.     Objective:  Vitals: BP (!) 150/82   Pulse 60   Ht 5' 3 (1.6 m)   Wt 172 lb (78 kg)   SpO2 98%   BMI 30.47 kg/m   Physical Exam Vitals and nursing note reviewed.  Constitutional:      General: She is not in acute distress.    Appearance: Normal appearance. She is not toxic-appearing.  HENT:     Head: Normocephalic and atraumatic.  Cardiovascular:     Rate and Rhythm: Normal rate and regular rhythm. No extrasystoles are present.    Pulses: Normal pulses.     Heart sounds: Normal heart sounds. No murmur heard.    No friction rub. No gallop.  Pulmonary:     Effort: Pulmonary effort is normal. No respiratory distress.     Breath sounds: Normal breath sounds. No wheezing or rales.  Skin:    General: Skin is warm and dry.  Neurological:     Mental Status: She is alert and oriented to person, place, and time. Mental status is at baseline.  Psychiatric:        Mood and Affect: Mood normal.        Behavior: Behavior normal.        Thought Content: Thought content normal.        Judgment:  Judgment normal.     Results:  Studies Obtained And Personally Reviewed By Me: Labs:  CBC w/ Differential Lab Results  Component Value Date   WBC 7.1 06/15/2023   RBC 4.72 06/15/2023   HGB 13.1 06/15/2023   HCT 38.2 06/15/2023   PLT 275 06/15/2023   MCV 80.9 06/15/2023   MCH 27.8 06/15/2023   MCHC 34.3 06/15/2023   RDW 13.9 06/15/2023   MPV 10.8 06/15/2023   LYMPHSABS 3,398 06/13/2022   MONOABS 423 02/25/2016   BASOSABS 7 06/15/2023    Comprehensive Metabolic Panel Lab Results  Component Value Date   NA 144 12/08/2023   K 4.4 12/08/2023   CL 100 12/08/2023  CO2 33 (H) 12/08/2023   GLUCOSE 113 (H) 12/08/2023   BUN 21 12/08/2023   CREATININE 1.53 (H) 12/08/2023   CALCIUM  9.9 12/08/2023   PROT 6.7 06/15/2023   ALBUMIN 4.4 02/25/2016   AST 19 06/15/2023   ALT 20 06/15/2023   ALKPHOS 74 02/25/2016   BILITOT 1.0 06/15/2023   EGFR 35 (L) 12/08/2023   GFRNONAA 56 (L) 02/21/2023   Lipid Panel  Lab Results  Component Value Date   CHOL 129 06/15/2023   HDL 55 06/15/2023   LDLCALC 58 06/15/2023   TRIG 82 06/15/2023   A1c Lab Results  Component Value Date   HGBA1C 6.8 (H) 12/08/2023    TSH Lab Results  Component Value Date   TSH 0.98 06/15/2023    No results found for any visits on 12/11/23. Assessment & Plan:  Hypertension: She is taking Olmesartan  20 mg, Amlodipine  10 mg, and Torsemide  20 mg daily for Hypertension. Does not take Torsemide  when going out of the house. As of 08/11/23 She was normotensive with a blood pressure of 138/78. Current blood pressure is  160/88 at 9:13 am and 150/82 and 9:24 am and 150/82 at 9:38 am.    If her blood pressure remains high, the dosage may have to be increased. Continue to monitor at-home blood pressures and report readings back to office.   Osteoarthritis: She has osteoarthritis in her hands, knees and shoulders. Both joints of her hands affected by osteoarthritis. She has had a knee replacement in both knees but says that  the left one hurts significantly. Patient said when she first received the knee replacement in her left knee she felt a fiery burning sensation.  Says her right shoulder hurts when extended.    Elevated Creatinine: Creatinine elevated at 1.53 on 12/08/2023.  Will be rechecked at the next visit after she has had a couple glasses of water.    Diabetes Mellitus, Type II: treated with Metformin  1000 mg daily. 12/11/23 HgbA1c 6.8 %  Return in about 2 weeks (around 12/25/2023) for Hypertension . She will need to take all of her BP meds including torsemide  before her next appt in order to fully assess her blood pressure control.    I,Emily Lagle,acting as a Neurosurgeon for Ronal JINNY Hailstone, MD.,have documented all relevant documentation on the behalf of Ronal JINNY Hailstone, MD,as directed by  Ronal JINNY Hailstone, MD while in the presence of Ronal JINNY Hailstone, MD.  I, Ronal JINNY Hailstone, MD, have reviewed all documentation for this visit. The documentation on 12/11/2023 for the exam, diagnosis, procedures, and orders are all accurate and complete.

## 2023-12-09 ENCOUNTER — Ambulatory Visit: Payer: Self-pay | Admitting: Internal Medicine

## 2023-12-09 LAB — BASIC METABOLIC PANEL WITH GFR
BUN/Creatinine Ratio: 14 (calc) (ref 6–22)
BUN: 21 mg/dL (ref 7–25)
CO2: 33 mmol/L — ABNORMAL HIGH (ref 20–32)
Calcium: 9.9 mg/dL (ref 8.6–10.4)
Chloride: 100 mmol/L (ref 98–110)
Creat: 1.53 mg/dL — ABNORMAL HIGH (ref 0.60–1.00)
Glucose, Bld: 113 mg/dL — ABNORMAL HIGH (ref 65–99)
Potassium: 4.4 mmol/L (ref 3.5–5.3)
Sodium: 144 mmol/L (ref 135–146)
eGFR: 35 mL/min/1.73m2 — ABNORMAL LOW (ref >=60)

## 2023-12-09 LAB — HEMOGLOBIN A1C
Hgb A1c MFr Bld: 6.8 % — ABNORMAL HIGH (ref <5.7)
Mean Plasma Glucose: 148 mg/dL
eAG (mmol/L): 8.2 mmol/L

## 2023-12-11 ENCOUNTER — Ambulatory Visit: Admitting: Internal Medicine

## 2023-12-11 ENCOUNTER — Encounter: Payer: Self-pay | Admitting: Internal Medicine

## 2023-12-11 VITALS — BP 150/82 | HR 60 | Ht 63.0 in | Wt 172.0 lb

## 2023-12-11 DIAGNOSIS — Z96651 Presence of right artificial knee joint: Secondary | ICD-10-CM

## 2023-12-11 DIAGNOSIS — Z96652 Presence of left artificial knee joint: Secondary | ICD-10-CM

## 2023-12-11 DIAGNOSIS — I10 Essential (primary) hypertension: Secondary | ICD-10-CM

## 2023-12-11 DIAGNOSIS — E785 Hyperlipidemia, unspecified: Secondary | ICD-10-CM

## 2023-12-11 DIAGNOSIS — E1169 Type 2 diabetes mellitus with other specified complication: Secondary | ICD-10-CM | POA: Diagnosis not present

## 2023-12-11 DIAGNOSIS — R7989 Other specified abnormal findings of blood chemistry: Secondary | ICD-10-CM | POA: Diagnosis not present

## 2023-12-11 DIAGNOSIS — Z7984 Long term (current) use of oral hypoglycemic drugs: Secondary | ICD-10-CM

## 2023-12-11 DIAGNOSIS — R609 Edema, unspecified: Secondary | ICD-10-CM | POA: Diagnosis not present

## 2023-12-11 DIAGNOSIS — Z8659 Personal history of other mental and behavioral disorders: Secondary | ICD-10-CM

## 2023-12-16 NOTE — Patient Instructions (Signed)
 We would like for you to take all of your blood pressure med including torsemide  before next visit in order to adequately assess blood pressure control. We amy need to increase olmesartan  if BP is not improved at next visit.

## 2023-12-20 ENCOUNTER — Telehealth: Payer: Self-pay | Admitting: Internal Medicine

## 2023-12-20 MED ORDER — CONTOUR MONITOR DEVI: 0 refills | Status: AC

## 2023-12-20 MED ORDER — GLUCOSE BLOOD VI STRP: ORAL_STRIP | 12 refills | Status: AC

## 2023-12-20 NOTE — Telephone Encounter (Signed)
 Patient asking for new Contour glucose monitor and test strips. Requests Rx be sent to [facility]. Order placed.

## 2023-12-25 ENCOUNTER — Other Ambulatory Visit

## 2023-12-25 DIAGNOSIS — R7989 Other specified abnormal findings of blood chemistry: Secondary | ICD-10-CM

## 2023-12-25 LAB — BASIC METABOLIC PANEL WITH GFR
BUN/Creatinine Ratio: 16 (calc) (ref 6–22)
BUN: 16 mg/dL (ref 7–25)
CO2: 31 mmol/L (ref 20–32)
Calcium: 9.5 mg/dL (ref 8.6–10.4)
Chloride: 99 mmol/L (ref 98–110)
Creat: 1.01 mg/dL — ABNORMAL HIGH (ref 0.60–1.00)
Glucose, Bld: 120 mg/dL — ABNORMAL HIGH (ref 65–99)
Potassium: 4.3 mmol/L (ref 3.5–5.3)
Sodium: 137 mmol/L (ref 135–146)
eGFR: 57 mL/min/1.73m2 — ABNORMAL LOW (ref >=60)

## 2023-12-26 ENCOUNTER — Ambulatory Visit: Payer: Self-pay | Admitting: Internal Medicine

## 2023-12-31 DIAGNOSIS — M25552 Pain in left hip: Secondary | ICD-10-CM | POA: Diagnosis not present

## 2023-12-31 DIAGNOSIS — M79652 Pain in left thigh: Secondary | ICD-10-CM | POA: Diagnosis not present

## 2024-02-11 ENCOUNTER — Other Ambulatory Visit: Payer: Self-pay | Admitting: Internal Medicine

## 2024-02-11 DIAGNOSIS — K219 Gastro-esophageal reflux disease without esophagitis: Secondary | ICD-10-CM

## 2024-02-19 ENCOUNTER — Encounter: Payer: Self-pay | Admitting: Radiology

## 2024-02-23 NOTE — Progress Notes (Shared)
 Patient Care Team: Perri Ronal PARAS, MD as PCP - General (Internal Medicine)  Visit Date: 02/26/24  Subjective:    Patient ID: Kathleen Greer , Female   DOB: 23-Feb-1946, 78 y.o.    MRN: [id number]   78 y.o. Female presents today for medical clearance for tooth extraction. Patient has a past medical history of Diabetes Mellitus, Type II, Hypertension, Hyperlipidemia, Hyperkalemia, GE Reflux, Vitamin-D deficiency, Anxiety.   She presents here in the office for pre op clearance for a tooth extraction. Last week she was eating and she broke a tooth.  History of Diabetes Mellitus, Type II treated with Metformin  1000 mg daily with breakfast and 5 mg glipizide  twice daily. She said that her blood sugar has been within normal range.   History of Hypertension treated with Amlodipine  10 mg daily and Losartan  100 mg daily. Blood pressure today is elevated at 140/80.    History of Hyperlipidemia Treated with Rosuvastatin  10 mg daily.   History of Hyperkalemia treated with Klor-con  20 meq twice daily.    History of GE Reflux treated with Prilosec 20 mg daily.   History of Vitamin-D Deficiency treated with Vitamin D  2,000 units daily.   History of Anxiety; Situational stress managed with 0.5 mg Xanax .    History of knee pain treated with Diclofenac  75 mg and tylenol . She says the pain comes and goes but that she is still able to be physically active.   Health Maintenance: she had an eye exam today.  Vaccine counseling: UTD on Influenza vaccine.   Past Medical History:  Diagnosis Date   Arthritis    Blood transfusion without reported diagnosis 04/18/1968   after hysterectomy   Dependent edema    Diabetes (HCC)    type 2   Generalized headaches    GERD (gastroesophageal reflux disease)    History of kidney stones    Hyperkalemia    Hypertension    IBS (irritable bowel syndrome)    PONV (postoperative nausea and vomiting)    Positional vertigo    Scoliosis      Family History   Problem Relation Age of Onset   Heart disease Mother    Stroke Mother    Hypertension Mother    Heart disease Father    Breast cancer Sister    Heart disease Brother    Hypertension Son    Prostate cancer Son    Colon cancer Neg Hx     Social History   Social History Narrative   Patient is retired and formerly worked at [employer] as a location manager. She does not smoke or consume alcohol.   05/2020 - Has been raising elementary school aged grandson as his father is in prison and mother has had drug abuse issues. Father is patient's grandson.    06/2022 - Her grandson is living with her. This is causing her some stress.         Family history: Adult son with hypertension. Patient's father died with an MI. Mother died of an MI. 1 brother died with heart failure. Another brother died in 03-23-05 with cirrhosis of the liver. Sister died at age 2. Another sister whose health is apparently unchanged      Review of Systems  All other systems reviewed and are negative.       Objective:   Vitals: BP (!) 140/80   Pulse 68   Ht 5' 3 (1.6 m)   Wt 176 lb (79.8 kg)   SpO2 98%  BMI 31.18 kg/m    Physical Exam Vitals and nursing note reviewed.  Constitutional:      General: She is not in acute distress.    Appearance: Normal appearance. She is not toxic-appearing.  HENT:     Head: Normocephalic and atraumatic.  Cardiovascular:     Rate and Rhythm: Normal rate and regular rhythm. No extrasystoles are present.    Pulses: Normal pulses.     Heart sounds: Normal heart sounds. No murmur heard.    No friction rub. No gallop.  Pulmonary:     Effort: Pulmonary effort is normal. No respiratory distress.     Breath sounds: Normal breath sounds. No wheezing or rales.  Musculoskeletal:     Right lower leg: Edema present.     Left lower leg: Edema present.     Comments: Trace Bilateral Edema.   Skin:    General: Skin is warm and dry.  Neurological:     Mental Status: She is alert  and oriented to person, place, and time. Mental status is at baseline.  Psychiatric:        Mood and Affect: Mood normal.        Behavior: Behavior normal.        Thought Content: Thought content normal.        Judgment: Judgment normal.       Results:     Labs:       Component Value Date/Time   NA 137 12/25/2023 0934   K 4.3 12/25/2023 0934   CL 99 12/25/2023 0934   CO2 31 12/25/2023 0934   GLUCOSE 120 (H) 12/25/2023 0934   BUN 16 12/25/2023 0934   CREATININE 1.01 (H) 12/25/2023 0934   CALCIUM  9.5 12/25/2023 0934   PROT 6.7 06/15/2023 0916   ALBUMIN 4.4 02/25/2016 1133   AST 19 06/15/2023 0916   ALT 20 06/15/2023 0916   ALKPHOS 74 02/25/2016 1133   BILITOT 1.0 06/15/2023 0916   GFRNONAA 56 (L) 02/21/2023 1030   GFRNONAA 58 (L) 05/01/2020 0916   GFRAA 67 05/01/2020 0916     Lab Results  Component Value Date   WBC 7.1 06/15/2023   HGB 13.1 06/15/2023   HCT 38.2 06/15/2023   MCV 80.9 06/15/2023   PLT 275 06/15/2023    Lab Results  Component Value Date   CHOL 129 06/15/2023   HDL 55 06/15/2023   LDLCALC 58 06/15/2023   TRIG 82 06/15/2023   CHOLHDL 2.3 06/15/2023    Lab Results  Component Value Date   HGBA1C 6.8 (H) 12/08/2023     Lab Results  Component Value Date   TSH 0.98 06/15/2023        Assessment & Plan:   Orders Placed This Encounter  Procedures   Hemoglobin A1c   EKG 12-Lead   Meds ordered this encounter  Medications   diclofenac  (VOLTAREN ) 75 MG EC tablet    Sig: Take 1 tablet (75 mg total) by mouth 2 (two) times daily.    Dispense:  60 tablet    Refill:  5    Medical clearance for tooth extraction- this is inoffice procedure and local anesthesia will be done according to patient. She presents here in the office for clearance for a tooth extraction. Last week she was eating and she broke a tooth. OK to proceed with dental extraction.  Diabetes Mellitus, Type II: treated with Metformin  1000 mg daily with breakfast and 5 mg  glipizide  twice daily. She said that her blood sugar  has been within normal range.   HgbA1c ordered. Hgb AIC is 6.8%. Watch diet a bit and continue same diabetic medication glipizide  and metformin    Hypertension: treated with Amlodipine  10 mg daily and Losartan  100 mg daily. Blood pressure today is elevated at 140/80.    Hyperlipidemia: Treated with Rosuvastatin  10 mg daily.   Hyperkalemia: treated with Klor-con  20 meq twice daily.    GE Reflux: treated with Prilosec 20 mg daily.   Vitamin-D Deficiency: treated with Vitamin D  2,000 units daily.   Anxiety; Situational stress: managed with 0.5 mg Xanax .    Knee pain: treated with Diclofenac  75 mg and tylenol . She says the pain comes and goes but that she is still able to be physically active.   Diclofenac  75 mg refilled.   Health Maintenance: she had an eye exam today.  Vaccine counseling: UTD on Influenza vaccine.   I,Makayla C Reid,acting as a scribe for Ronal JINNY Hailstone, MD.,have documented all relevant documentation on the behalf of Ronal JINNY Hailstone, MD,as directed by  Ronal JINNY Hailstone, MD while in the presence of Ronal JINNY Hailstone, MD.   I, Ronal JINNY Hailstone, MD, have reviewed all documentation for this visit. The documentation on 02/26/2024 for the exam, diagnosis, procedures, and orders are all accurate and complete.

## 2024-02-26 ENCOUNTER — Ambulatory Visit: Admitting: Internal Medicine

## 2024-02-26 ENCOUNTER — Encounter: Payer: Self-pay | Admitting: Internal Medicine

## 2024-02-26 VITALS — BP 140/80 | HR 68 | Ht 63.0 in | Wt 176.0 lb

## 2024-02-26 DIAGNOSIS — E785 Hyperlipidemia, unspecified: Secondary | ICD-10-CM

## 2024-02-26 DIAGNOSIS — M25569 Pain in unspecified knee: Secondary | ICD-10-CM

## 2024-02-26 DIAGNOSIS — I10 Essential (primary) hypertension: Secondary | ICD-10-CM

## 2024-02-26 DIAGNOSIS — G8929 Other chronic pain: Secondary | ICD-10-CM

## 2024-02-26 DIAGNOSIS — E559 Vitamin D deficiency, unspecified: Secondary | ICD-10-CM

## 2024-02-26 DIAGNOSIS — F419 Anxiety disorder, unspecified: Secondary | ICD-10-CM

## 2024-02-26 DIAGNOSIS — Z01818 Encounter for other preprocedural examination: Secondary | ICD-10-CM

## 2024-02-26 DIAGNOSIS — E875 Hyperkalemia: Secondary | ICD-10-CM

## 2024-02-26 DIAGNOSIS — E1169 Type 2 diabetes mellitus with other specified complication: Secondary | ICD-10-CM

## 2024-02-26 DIAGNOSIS — Z7984 Long term (current) use of oral hypoglycemic drugs: Secondary | ICD-10-CM

## 2024-02-26 DIAGNOSIS — K219 Gastro-esophageal reflux disease without esophagitis: Secondary | ICD-10-CM

## 2024-02-26 DIAGNOSIS — E119 Type 2 diabetes mellitus without complications: Secondary | ICD-10-CM

## 2024-02-26 DIAGNOSIS — Z8659 Personal history of other mental and behavioral disorders: Secondary | ICD-10-CM

## 2024-02-26 MED ORDER — DICLOFENAC SODIUM 75 MG PO TBEC: 75.0000 mg | DELAYED_RELEASE_TABLET | Freq: Two times a day (BID) | ORAL | 5 refills | Status: AC

## 2024-02-27 ENCOUNTER — Ambulatory Visit: Payer: Self-pay | Admitting: Internal Medicine

## 2024-02-27 ENCOUNTER — Telehealth: Payer: Self-pay

## 2024-02-27 LAB — HEMOGLOBIN A1C
Hgb A1c MFr Bld: 6.8 % — ABNORMAL HIGH (ref <5.7)
Mean Plasma Glucose: 148 mg/dL
eAG (mmol/L): 8.2 mmol/L

## 2024-02-27 NOTE — Patient Instructions (Signed)
 Medically cleared for out patient dental extraction. Hgb AIC stable.

## 2024-02-27 NOTE — Telephone Encounter (Signed)
 Called Kathleen Greer back I had faxed the form this morning, she looked through her faxes and she has located it.   Copied from CRM [id number]. Topic: General - Other >> Feb 27, 2024  2:57 PM Antony RAMAN wrote: Reason for CRM: Kathleen Greer from [facility] calling regarding the medical consultation form, she said it was faxed on 11/7, callback- [phone]

## 2024-03-06 ENCOUNTER — Other Ambulatory Visit: Payer: Self-pay | Admitting: Internal Medicine

## 2024-03-06 DIAGNOSIS — Z1231 Encounter for screening mammogram for malignant neoplasm of breast: Secondary | ICD-10-CM

## 2024-03-14 NOTE — Addendum Note (Signed)
 Addended by: PERRI RONAL PARAS on: 03/14/2024 03:59 PM   Modules accepted: Level of Service

## 2024-04-09 ENCOUNTER — Ambulatory Visit
Admission: RE | Admit: 2024-04-09 | Discharge: 2024-04-09 | Disposition: A | Discharge status: Home / Self Care | Source: Ambulatory Visit | Attending: Internal Medicine | Admitting: Internal Medicine

## 2024-04-09 DIAGNOSIS — Z1231 Encounter for screening mammogram for malignant neoplasm of breast: Secondary | ICD-10-CM

## 2024-05-21 ENCOUNTER — Other Ambulatory Visit: Payer: Self-pay | Admitting: Internal Medicine
